# Patient Record
Sex: Female | Born: 1955 | Race: White | Hispanic: No | Marital: Married | Smoking: Current every day smoker
Health system: Southern US, Community
[De-identification: ages and names within clinical notes are randomized; demographics above are authoritative.]

## PROBLEM LIST (undated history)

## (undated) DIAGNOSIS — K5190 Ulcerative colitis, unspecified, without complications: Secondary | ICD-10-CM

## (undated) DIAGNOSIS — F329 Major depressive disorder, single episode, unspecified: Secondary | ICD-10-CM

## (undated) DIAGNOSIS — A498 Other bacterial infections of unspecified site: Secondary | ICD-10-CM

## (undated) DIAGNOSIS — F419 Anxiety disorder, unspecified: Secondary | ICD-10-CM

## (undated) DIAGNOSIS — E7800 Pure hypercholesterolemia, unspecified: Secondary | ICD-10-CM

## (undated) DIAGNOSIS — R002 Palpitations: Secondary | ICD-10-CM

## (undated) DIAGNOSIS — K529 Noninfective gastroenteritis and colitis, unspecified: Principal | ICD-10-CM

## (undated) DIAGNOSIS — F32A Depression, unspecified: Secondary | ICD-10-CM

## (undated) DIAGNOSIS — K859 Acute pancreatitis without necrosis or infection, unspecified: Secondary | ICD-10-CM

## (undated) HISTORY — PX: APPENDECTOMY: SHX54

## (undated) HISTORY — DX: Palpitations: R00.2

## (undated) HISTORY — DX: Major depressive disorder, single episode, unspecified: F32.9

## (undated) HISTORY — DX: Pure hypercholesterolemia, unspecified: E78.00

## (undated) HISTORY — DX: Noninfective gastroenteritis and colitis, unspecified: K52.9

## (undated) HISTORY — DX: Depression, unspecified: F32.A

## (undated) HISTORY — DX: Ulcerative colitis, unspecified, without complications: K51.90

## (undated) HISTORY — PX: TONSILLECTOMY: SUR1361

## (undated) HISTORY — DX: Other bacterial infections of unspecified site: A49.8

## (undated) HISTORY — DX: Anxiety disorder, unspecified: F41.9

---

## 2005-11-13 ENCOUNTER — Ambulatory Visit: Payer: Self-pay | Admitting: Internal Medicine

## 2006-01-13 ENCOUNTER — Ambulatory Visit: Payer: Self-pay | Admitting: Internal Medicine

## 2006-02-05 ENCOUNTER — Ambulatory Visit: Payer: Self-pay | Admitting: Internal Medicine

## 2006-05-21 ENCOUNTER — Emergency Department (HOSPITAL_COMMUNITY): Admission: EM | Admit: 2006-05-21 | Discharge: 2006-05-21 | Payer: Self-pay | Admitting: Emergency Medicine

## 2007-07-03 ENCOUNTER — Ambulatory Visit: Payer: Self-pay | Admitting: Internal Medicine

## 2007-07-03 ENCOUNTER — Encounter (INDEPENDENT_AMBULATORY_CARE_PROVIDER_SITE_OTHER): Payer: Self-pay | Admitting: *Deleted

## 2007-07-03 DIAGNOSIS — J189 Pneumonia, unspecified organism: Secondary | ICD-10-CM | POA: Insufficient documentation

## 2007-08-30 ENCOUNTER — Ambulatory Visit: Payer: Self-pay | Admitting: Internal Medicine

## 2007-08-30 DIAGNOSIS — R1084 Generalized abdominal pain: Secondary | ICD-10-CM | POA: Insufficient documentation

## 2007-08-30 DIAGNOSIS — R109 Unspecified abdominal pain: Secondary | ICD-10-CM | POA: Insufficient documentation

## 2007-08-30 DIAGNOSIS — Z8719 Personal history of other diseases of the digestive system: Secondary | ICD-10-CM | POA: Insufficient documentation

## 2007-09-03 ENCOUNTER — Ambulatory Visit: Payer: Self-pay | Admitting: Gastroenterology

## 2007-09-03 LAB — CONVERTED CEMR LAB
ALT: 13 units/L (ref 0–35)
AST: 16 units/L (ref 0–37)
Alkaline Phosphatase: 63 units/L (ref 39–117)
BUN: 12 mg/dL (ref 6–23)
Basophils Relative: 1.2 % — ABNORMAL HIGH (ref 0.0–1.0)
Bilirubin, Direct: 0.1 mg/dL (ref 0.0–0.3)
CO2: 28 meq/L (ref 19–32)
Calcium: 9.2 mg/dL (ref 8.4–10.5)
Chloride: 105 meq/L (ref 96–112)
Eosinophils Absolute: 0 10*3/uL (ref 0.0–0.6)
Eosinophils Relative: 0.7 % (ref 0.0–5.0)
GFR calc Af Amer: 113 mL/min
GFR calc non Af Amer: 94 mL/min
Glucose, Bld: 91 mg/dL (ref 70–99)
Monocytes Relative: 8.6 % (ref 3.0–11.0)
Neutro Abs: 3.6 10*3/uL (ref 1.4–7.7)
Platelets: 202 10*3/uL (ref 150–400)
RBC: 4.16 M/uL (ref 3.87–5.11)
WBC: 5 10*3/uL (ref 4.5–10.5)

## 2007-09-06 ENCOUNTER — Ambulatory Visit (HOSPITAL_COMMUNITY): Admission: RE | Admit: 2007-09-06 | Discharge: 2007-09-06 | Payer: Self-pay | Admitting: Gastroenterology

## 2007-09-06 ENCOUNTER — Encounter: Payer: Self-pay | Admitting: Gastroenterology

## 2007-09-06 ENCOUNTER — Encounter: Payer: Self-pay | Admitting: Internal Medicine

## 2007-09-10 ENCOUNTER — Ambulatory Visit: Payer: Self-pay | Admitting: Gastroenterology

## 2007-10-01 ENCOUNTER — Ambulatory Visit: Payer: Self-pay | Admitting: Gastroenterology

## 2007-10-05 ENCOUNTER — Ambulatory Visit (HOSPITAL_COMMUNITY): Admission: RE | Admit: 2007-10-05 | Discharge: 2007-10-05 | Payer: Self-pay | Admitting: Gastroenterology

## 2008-04-11 ENCOUNTER — Ambulatory Visit: Payer: Self-pay | Admitting: Internal Medicine

## 2008-04-11 DIAGNOSIS — F411 Generalized anxiety disorder: Secondary | ICD-10-CM | POA: Insufficient documentation

## 2008-05-08 ENCOUNTER — Telehealth: Payer: Self-pay | Admitting: Internal Medicine

## 2008-05-24 ENCOUNTER — Telehealth: Payer: Self-pay | Admitting: Internal Medicine

## 2008-06-16 ENCOUNTER — Telehealth: Payer: Self-pay | Admitting: Internal Medicine

## 2008-10-18 ENCOUNTER — Telehealth: Payer: Self-pay | Admitting: Internal Medicine

## 2009-02-05 ENCOUNTER — Telehealth: Payer: Self-pay | Admitting: Internal Medicine

## 2009-05-09 ENCOUNTER — Telehealth: Payer: Self-pay | Admitting: Internal Medicine

## 2009-10-11 ENCOUNTER — Telehealth: Payer: Self-pay | Admitting: Internal Medicine

## 2009-10-26 ENCOUNTER — Telehealth: Payer: Self-pay

## 2009-10-29 ENCOUNTER — Ambulatory Visit: Payer: Self-pay | Admitting: Internal Medicine

## 2009-10-29 DIAGNOSIS — F32A Depression, unspecified: Secondary | ICD-10-CM | POA: Insufficient documentation

## 2009-10-29 DIAGNOSIS — F329 Major depressive disorder, single episode, unspecified: Secondary | ICD-10-CM | POA: Insufficient documentation

## 2009-10-29 DIAGNOSIS — Z8679 Personal history of other diseases of the circulatory system: Secondary | ICD-10-CM | POA: Insufficient documentation

## 2010-05-06 ENCOUNTER — Telehealth: Payer: Self-pay | Admitting: Internal Medicine

## 2010-08-27 ENCOUNTER — Telehealth: Payer: Self-pay | Admitting: Internal Medicine

## 2010-09-26 NOTE — Progress Notes (Signed)
  Phone Note Call from Patient   Caller: Patient Call For: Marletta Lor  MD Summary of Call: [phone] Wants to wean off of Paroxetine?  How should she do it? Initial call taken by: [facility] CMA AAMA,  August 27, 2010 1:41 PM    1/2 daily for 3 weeks, then d/c Per Dr. Raliegh Ip and pt notified.

## 2010-09-26 NOTE — Progress Notes (Signed)
Summary: refill today  Phone Note Call from Patient Call back at Home Phone [phone]   Caller: Patient---live call Reason for Call: Refill Medication Summary of Call: pt is out of meds for 2 days. she is aware that she needs an appt. refuses appt today. will call back for appt. call pt when done. Initial call taken by: Despina Arias,  October 11, 2009 12:37 PM    Prescriptions: PAROXETINE HCL 20 MG  TABS (PAROXETINE HCL) one daily  #30 x 1   Entered by:   Cay Schillings LPN   Authorized by:   Marletta Lor  MD   Signed by:   Cay Schillings LPN on 35/24/8185   Method used:   [facility]        [facility]  [phone][facility] (retail[facility]       [place], [place]  [address]       Ph: [phone] or [phone]       Fax: [phone]   RxID:   [id number]  30 day supply- needs ROV prior to further refills   spoke with pt - verbalized understanding for f/u appt , only 30 days of med.  KIK

## 2010-09-26 NOTE — Progress Notes (Signed)
  Phone Note Call from Patient   Caller: Patient Reason for Call: Refill Medication Summary of Call: needs appt for monday - tried to fill lorazepam and couldn't - we never rec'd request - pt aware - appt made.   Initial call taken by: Cay Schillings LPN,  October 26, 7197 3:52 PM

## 2010-09-26 NOTE — Assessment & Plan Note (Signed)
Summary: FUP PER KIM//CCM   Vital Signs:  Patient profile:   55 year old female Weight:      134 pounds Temp:     98.4 degrees F oral BP sitting:   100 / 60  (right arm) Cuff size:   regular  Vitals Entered By: Cay Schillings LPN (October 29, 7260 03:55 AM) CC: medication review - needs refills Is Patient Diabetic? No   CC:  medication review - needs refills.  History of Present Illness: 55 year old patient who is seen today for follow-up of anxiety, depression.  She has done quite well, and much less depression.  She continues to benefit from lorazepam for anxiety.   she does describe some palpitations that occur when she first wakes in the morning on a fairly consistent basis.  She describes fast forceful irregular palpitations.  These resolve when she gets up and starts moving around a bit.  No associated symptoms.  Preventive Screening-Counseling & Management  Alcohol-Tobacco     Smoking Status: current  Allergies (verified): No Known Drug Allergies  Past History:  Past Medical History: migraine headaches Anxiety palpitations Depression  Family History: Reviewed history from 08/30/2007 and no changes required. father died age 12, prostate cancer  mother in good health, as are 4 brothers and one sister  Social History: Reviewed history from 08/30/2007 and no changes required. Current Smoker Divorced 3 children  Review of Systems       The patient complains of weight gain and depression.  The patient denies anorexia, fever, weight loss, vision loss, decreased hearing, hoarseness, chest pain, syncope, dyspnea on exertion, peripheral edema, prolonged cough, headaches, hemoptysis, abdominal pain, melena, hematochezia, severe indigestion/heartburn, hematuria, incontinence, genital sores, muscle weakness, suspicious skin lesions, transient blindness, difficulty walking, unusual weight change, abnormal bleeding, enlarged lymph nodes, angioedema, and breast masses.     Physical Exam  General:  Well-developed,well-nourished,in no acute distress; alert,appropriate and cooperative throughout examination Head:  Normocephalic and atraumatic without obvious abnormalities. No apparent alopecia or balding. Eyes:  No corneal or conjunctival inflammation noted. EOMI. Perrla. Funduscopic exam benign, without hemorrhages, exudates or papilledema. Vision grossly normal. Ears:  External ear exam shows no significant lesions or deformities.  Otoscopic examination reveals clear canals, tympanic membranes are intact bilaterally without bulging, retraction, inflammation or discharge. Hearing is grossly normal bilaterally. Mouth:  Oral mucosa and oropharynx without lesions or exudates.  Teeth in good repair. Neck:  No deformities, masses, or tenderness noted. Lungs:  Normal respiratory effort, chest expands symmetrically. Lungs are clear to auscultation, no crackles or wheezes. Heart:  Normal rate and regular rhythm. S1 and S2 normal without gallop, murmur, click, rub or other extra sounds. Abdomen:  Bowel sounds positive,abdomen soft and non-tender without masses, organomegaly or hernias noted.   Impression & Recommendations:  Problem # 1:  PALPITATIONS, HX OF (ICD-V12.50) have asked the patient to monitor and to check her pulse rate.  She will call if pulse rate is in excess of 120  Problem # 2:  ANXIETY (ICD-300.00)  Her updated medication list for this problem includes:    Paroxetine Hcl 20 Mg Tabs (Paroxetine hcl) ..... One daily    Lorazepam 1 Mg Tabs (Lorazepam) ..... One tablet twice daily as needed  Her updated medication list for this problem includes:    Paroxetine Hcl 20 Mg Tabs (Paroxetine hcl) ..... One daily    Lorazepam 1 Mg Tabs (Lorazepam) ..... One tablet twice daily as needed  Problem # 3:  DEPRESSION (ICD-311)  Her  updated medication list for this problem includes:    Paroxetine Hcl 20 Mg Tabs (Paroxetine hcl) ..... One daily    Lorazepam 1 Mg  Tabs (Lorazepam) ..... One tablet twice daily as needed  Her updated medication list for this problem includes:    Paroxetine Hcl 20 Mg Tabs (Paroxetine hcl) ..... One daily    Lorazepam 1 Mg Tabs (Lorazepam) ..... One tablet twice daily as needed  Complete Medication List: 1)  Paroxetine Hcl 20 Mg Tabs (Paroxetine hcl) .... One daily 2)  Lorazepam 1 Mg Tabs (Lorazepam) .... One tablet twice daily as needed 3)  Chantix Starting Month Pak 0.5 Mg X 11 & 1 Mg X 42 Misc (Varenicline tartrate) .... As directed 4)  Chantix Continuing Month Pak 1 Mg Tabs (Varenicline tartrate) .... As directed 5)  Valtrex 500 Mg Tabs (Valacyclovir hcl) .... One twice daily for 3 days  Patient Instructions: 1)  Limit your Sodium (Salt). 2)  It is important that you exercise regularly at least 20 minutes 5 times a week. If you develop chest pain, have severe difficulty breathing, or feel very tired , stop exercising immediately and seek medical attention. 3)  Take calcium +Vitamin D daily. 4)  annual exam in 6 to 12 months Prescriptions: VALTREX 500 MG TABS (VALACYCLOVIR HCL) one twice daily for 3 days  #20 x 4   Entered and Authorized by:   Marletta Lor  MD   Signed by:   Marletta Lor  MD on 10/29/2009   Method used:   Print then Give to Patient   RxID:   [id number] LORAZEPAM 1 MG  TABS (LORAZEPAM) one tablet twice daily as needed  #90 x 4   Entered and Authorized by:   Marletta Lor  MD   Signed by:   Marletta Lor  MD on 10/29/2009   Method used:   Print then Give to Patient   RxID:   [id number] PAROXETINE HCL 20 MG  TABS (PAROXETINE HCL) one daily  #90 x 4   Entered and Authorized by:   Marletta Lor  MD   Signed by:   Marletta Lor  MD on 10/29/2009   Method used:   Print then Give to Patient   RxID:   [id number]

## 2010-09-26 NOTE — Progress Notes (Signed)
Summary: refill lorazopam  Phone Note Refill Request Message from:  Fax from Pharmacy on May 06, 2010 1:59 PM  Refills Requested: Medication #1:  LORAZEPAM 1 MG  TABS one tablet twice daily as needed   [facility]   Method Requested: Fax to [facility] Initial call taken by: Cay Schillings LPN,  May 06, 720 2:00 PM    Prescriptions: LORAZEPAM 1 MG  TABS (LORAZEPAM) one tablet twice daily as needed  #90 x 2   Entered by:   Cay Schillings LPN   Authorized by:   Marletta Lor  MD   Signed by:   Cay Schillings LPN on 71/16/5461   Method used:   Historical   RxID:   [id number]  faxed to [facility]

## 2010-10-31 ENCOUNTER — Other Ambulatory Visit: Payer: Self-pay | Admitting: Internal Medicine

## 2010-12-06 ENCOUNTER — Other Ambulatory Visit: Payer: Self-pay

## 2010-12-06 MED ORDER — LORAZEPAM 1 MG PO TABS: 1.0000 mg | ORAL_TABLET | Freq: Two times a day (BID) | ORAL | Status: DC

## 2011-01-07 NOTE — Assessment & Plan Note (Signed)
[name] OFFICE NOTE   [name], [name]                        MRN:          [id number]  DATE:09/03/2007                            DOB:          [birth year]    PRIMARY CARE PHYSICIAN:  Dr. Burnice Logan.   REASON FOR REFERRAL:  Dr. Burnice Logan asked me to evaluate [name] in  consultation regarding diarrhea, bleeding, cramping in abdomen.   HISTORY OF PRESENT ILLNESS:  [name] is a very pleasant 56 year old  woman who for approximately the past year has had intermittent loose  stools, sometimes associated with bleeding.  Over the same period of  time she has lost about 10 pounds.  She has intermittent cramps in her  abdomen that seem to be brief.  She has had another episode of these  loose stools for the past week or so, this time it seems more frankly  bloody diarrhea.  She has been fairly tired, she gets full very early  after she eats.  She has never had a colonoscopy.   REVIEW OF SYSTEMS:  Notable for a 10 pound weight loss, is otherwise  negative except as mentioned above.   PAST MEDICAL HISTORY:  None.   CURRENT MEDICINES:  None.   DRUG ALLERGIES:  NONE.   SOCIAL HISTORY:  Separated with 3 children, lives with her son.  Works  in [employer].  Smokes 4-5 cigarettes a day (has not been cutting  back).  Drinks 2-3 glasses of wine a week.  Drinks 1 glass of  caffeinated tea a day but not much more caffeine.   FAMILY HISTORY:  No colon cancer, colon polyps or colitis in family.   PHYSICAL EXAMINATION:  Five foot five inches, 123 pounds, blood pressure  130/70, pulse 68.  CONSTITUTIONAL:  Generally well-appearing.  NEUROLOGIC:  Alert and oriented x3.  EYES:  Extraocular movements intact.  MOUTH:  Oropharynx moist, no lesions.  NECK:  Supple no lymphadenopathy.  CARDIOVASCULAR:  Heart regular rate and rhythm.  LUNGS:  Clear to auscultation bilaterally.  ABDOMEN:  Soft, nontender,  nondistended, normal bowel sounds.  EXTREMITIES:  No lower extremity edema.  SKIN:  No rashes or lesions on visible extremities.   ASSESSMENT/PLAN:  A 55 year old woman with intermittent loose and  sometimes bloody stools, now with one week of loose, bloody stools.   I am concerned that she may have new diagnosis of inflammatory bowel  disease.  Less likely is neoplastic process or infection.  I will  arrange for her to have blood tests done today for complete metabolic  profile, thyroid testing, CBC, sed rate.  We will also arrange for her  to have a colonoscopy done in the next 2-3 days.  I would not want to  start her on any specific treatments until after evaluating her colon.     Milus Banister, MD  Electronically Signed    DPJ/MedQ  DD: 09/03/2007  DT: 09/03/2007  Job #: [id number]   cc:   Marletta Lor, MD

## 2011-01-07 NOTE — Assessment & Plan Note (Signed)
[facility] OFFICE NOTE   [name]                        MRN:          [id number]  DATE:10/01/2007                            DOB:          10-04-1955    PRIMARY CARE PHYSICIAN:  Paige Lor, MD.   GI PROBLEM LIST:  1. Patchy colitis.  Rectum and right colon most affected.  January      2009 colonoscopy, biopsies showed chronic, active, mucosal      inflammation.  Most consistent with inflammatory bowel disease.      The patient had presented with diarrhea, bleeding, and cramping.      Weight loss.  2. Tubular adenoma in rectum 15 mm, recall colonoscopy at three-year      interval.   INTERVAL HISTORY:  I last saw [name] at the time of her incomplete  colonoscopy about three weeks ago.  She had some patchy colitis on an  incomplete colonoscopy.  I was only able to get to her distal ascending  colon.  I started her on Lialda two pills a day and she took them for  about a week and since then has stopped taking them.  She says she feels  much better and thinks that things have just gone away.  She has had no  bleeding, no cramping, no diarrhea.   CURRENT MEDICINES:  None.   PHYSICAL EXAMINATION:  VITAL SIGNS:  Weight 127 pounds, she is up 4  pounds since her last visit, blood pressure 142/78, pulse 68.  CONSTITUTIONAL:  Generally well-appearing.  ABDOMEN:  Soft, nontender, and nondistended, normal bowel sounds.   ASSESSMENT AND PLAN:  A 55 year old woman with patchy colitis seen on  incomplete colonoscopy last month.   First, the colonoscopy was indeed incomplete due to tortuosity and  length of colon and so I arranged for her to have a barium enema to  evaluate the remaining 15 to 20% of her right colon.   Second, it does appear that she has inflammatory bowel disease.  She is  certain that she is back to normal and only took Lialda for one week and  has been off it now for three  weeks.  I explained to her that I think  that it is unusual for symptoms to go away so quickly and not require  maintenance medicines, but that I think it is reasonable since she is  indeed feeling back to normal that we simply observe her clinically for  now.  She will therefore return to see me in two months time.  She knows  she should get in touch much sooner than that if she has any troubles.  My plan at that point would be to restart her on her Lialda and probably  work her up to  a full strength of that.  We will have to consider evaluating her small  intestine at some point as well given the patchy nature of this colitis;  Crohn's is more likely than ulcerative colitis.     Paige Banister, MD  Electronically Signed    DPJ/MedQ  DD: 10/01/2007  DT: 10/01/2007  Job #: [id number]   cc:   Paige Lor, MD

## 2011-01-27 ENCOUNTER — Telehealth: Payer: Self-pay | Admitting: Gastroenterology

## 2011-01-27 NOTE — Telephone Encounter (Signed)
Pt given appt for 01/28/11

## 2011-01-27 NOTE — Telephone Encounter (Signed)
Rov before any procedures.

## 2011-01-27 NOTE — Telephone Encounter (Signed)
Pt has abd pain and nausea.  Has BRB on toilet tissue and in the stool.  Also some mucous.  Pt is due for a recall colon.  Do you want to schedule her for a colon or see her in the office first?  Please advise.  Her records are in [place].

## 2011-01-28 ENCOUNTER — Ambulatory Visit (INDEPENDENT_AMBULATORY_CARE_PROVIDER_SITE_OTHER): Payer: Self-pay | Admitting: Gastroenterology

## 2011-01-28 ENCOUNTER — Other Ambulatory Visit (INDEPENDENT_AMBULATORY_CARE_PROVIDER_SITE_OTHER): Payer: Self-pay

## 2011-01-28 VITALS — BP 126/74 | HR 68 | Ht 65.0 in | Wt 135.0 lb

## 2011-01-28 DIAGNOSIS — K5289 Other specified noninfective gastroenteritis and colitis: Secondary | ICD-10-CM

## 2011-01-28 DIAGNOSIS — K529 Noninfective gastroenteritis and colitis, unspecified: Secondary | ICD-10-CM

## 2011-01-28 LAB — CBC WITH DIFFERENTIAL/PLATELET
Basophils Relative: 0.6 % (ref 0.0–3.0)
Eosinophils Relative: 1.2 % (ref 0.0–5.0)
HCT: 36.9 % (ref 36.0–46.0)
Hemoglobin: 12.9 g/dL (ref 12.0–15.0)
Lymphs Abs: 1.6 10*3/uL (ref 0.7–4.0)
MCV: 91 fl (ref 78.0–100.0)
Monocytes Absolute: 0.6 10*3/uL (ref 0.1–1.0)
Monocytes Relative: 9.9 % (ref 3.0–12.0)
Neutro Abs: 3.4 10*3/uL (ref 1.4–7.7)
WBC: 5.7 10*3/uL (ref 4.5–10.5)

## 2011-01-28 LAB — COMPREHENSIVE METABOLIC PANEL
Alkaline Phosphatase: 54 U/L (ref 39–117)
BUN: 17 mg/dL (ref 6–23)
CO2: 28 mEq/L (ref 19–32)
Creatinine, Ser: 0.7 mg/dL (ref 0.4–1.2)
GFR: 89.57 mL/min (ref >60.00)
Glucose, Bld: 91 mg/dL (ref 70–99)
Total Bilirubin: 0.3 mg/dL (ref 0.3–1.2)
Total Protein: 6.8 g/dL (ref 6.0–8.3)

## 2011-01-28 LAB — SEDIMENTATION RATE: Sed Rate: 21 mm/hr (ref 0–22)

## 2011-01-28 MED ORDER — MESALAMINE 1.2 G PO TBEC: 1200.0000 mg | DELAYED_RELEASE_TABLET | Freq: Four times a day (QID) | ORAL | Status: DC

## 2011-01-28 MED ORDER — PEG-KCL-NACL-NASULF-NA ASC-C 100 G PO SOLR: 1.0000 | ORAL | Status: DC

## 2011-01-28 NOTE — Patient Instructions (Signed)
Will call in prescription for lialda (take 4 pills once daily). You will be set up for a colonoscopy (at [facility] with propofol sedation). You will have labs checked today in the basement lab.  Please head down after you check out with the front desk  (cbc, cmet, ESR). A copy of this information will be made available to your PCP.

## 2011-01-28 NOTE — Progress Notes (Signed)
Review of pertinent gastrointestinal problems: 1. patchy acute and chronic colitis, colonoscopy January 2009. The examination was incomplete. Followup barium enema was normal. Very brief treatment with mesalamine resolved her symptoms and she never restarted the medicine or followed up. 2. tubular adenoma, 15 mm, colonoscopy 2009. Recall at 3 year interval   HPI: This is a  very pleasant 55 year old woman whom I last saw about 3-1/2 years ago.  She has had episodes of nausea, indigestions.  She has had intermittent mild bloody diarrhea for a few days in a row (every few months).  Weaned off paxil a month ago. For past 2-3 weeks has had looser than usual and bloody stools, almost with every BM.  Mild incontinence. +urgency.  + cramping abdomen.  Has gained some weight over past few years.  She smokes most of a pack cigs per day.   Review of systems: Pertinent positive and negative review of systems were noted in the above HPI section.  All other review of systems was otherwise negative.   Past Medical History, Past Surgical History, Family History, Social History, Current Medications, Allergies were all reviewed with the patient via [facility] record system.   Physical Exam: BP 126/74  Pulse 68  Ht 5' 5"  (1.651 m)  Wt 135 lb (61.236 kg)  BMI 22.47 kg/m2 Constitutional: generally well-appearing Psychiatric: alert and oriented x3 Eyes: extraocular movements intact Mouth: oral pharynx moist, no lesions Neck: supple no lymphadenopathy Cardiovascular: heart regular rate and rhythm Lungs: clear to auscultation bilaterally Abdomen: soft, nontender, nondistended, no obvious ascites, no peritoneal signs, normal bowel sounds Extremities: no lower extremity edema bilaterally Skin: no lesions on visible extremities    Assessment and plan: 55 y.o. female with likely colitis  She is due for polyp surveillance colonoscopy and we will schedule her for repeat  colonoscopy. This should be done at the hospital with propofol given the incomplete examination in 2009. I will start her on mesalamine, full dose to treat what is likely a colitis flare. She will also get a repeat set of labs, see those listed below.

## 2011-02-03 ENCOUNTER — Telehealth: Payer: Self-pay

## 2011-02-03 NOTE — Telephone Encounter (Signed)
Pt appt rescheduled for 03/06/11 pt aware as well as endo

## 2011-02-03 NOTE — Telephone Encounter (Signed)
Left message on machine to call back  

## 2011-02-10 ENCOUNTER — Telehealth: Payer: Self-pay | Admitting: *Deleted

## 2011-02-10 MED ORDER — LORAZEPAM 1 MG PO TABS: 1.0000 mg | ORAL_TABLET | Freq: Every day | ORAL | Status: DC

## 2011-02-10 NOTE — Telephone Encounter (Signed)
Pt states rx refill for anxiety med was denied, ask to make appt.  Has appt scheduled 02/17/11 at 2pm needs enough medication called in until next Monday.

## 2011-02-10 NOTE — Telephone Encounter (Signed)
Spoke with pt - will give #0 only - must keep appt next week for any future refills , dr. Must see at least yearly

## 2011-02-17 ENCOUNTER — Encounter: Payer: Self-pay | Admitting: Internal Medicine

## 2011-02-17 ENCOUNTER — Ambulatory Visit (INDEPENDENT_AMBULATORY_CARE_PROVIDER_SITE_OTHER): Payer: Self-pay | Admitting: Internal Medicine

## 2011-02-17 DIAGNOSIS — F3289 Other specified depressive episodes: Secondary | ICD-10-CM

## 2011-02-17 DIAGNOSIS — F329 Major depressive disorder, single episode, unspecified: Secondary | ICD-10-CM

## 2011-02-17 DIAGNOSIS — F411 Generalized anxiety disorder: Secondary | ICD-10-CM

## 2011-02-17 DIAGNOSIS — K529 Noninfective gastroenteritis and colitis, unspecified: Secondary | ICD-10-CM

## 2011-02-17 DIAGNOSIS — K5289 Other specified noninfective gastroenteritis and colitis: Secondary | ICD-10-CM

## 2011-02-17 MED ORDER — LORAZEPAM 1 MG PO TABS: 1.0000 mg | ORAL_TABLET | Freq: Every day | ORAL | Status: DC

## 2011-02-17 NOTE — Patient Instructions (Signed)
Attempt at a dose reduction of 0.5 mg of lorazepam at bedtime. If this is well tolerated use as needed only  GI followup as scheduled    It is important that you exercise regularly, at least 20 minutes 3 to 4 times per week.  If you develop chest pain or shortness of breath seek  medical attention.  Call or return to clinic prn if these symptoms worsen or fail to improve as anticipated.

## 2011-02-17 NOTE — Progress Notes (Signed)
  Subjective:    Patient ID: [name], female    DOB: November 15, 1955, 55 y.o.   MRN: [id number]  HPI  55 year old patient who has a history of anxiety depression. Approximately 6 weeks ago she tapered and discontinued Paxil and has done fairly well she still has some anxiety and insomnia issues but is pleased to be off the Paxil. She continues to take her lorazepam 1 mg at bedtime. She feels that she needs an anxiolytic at bedtime to assist with sleep. She is followed closely by GI for colitis and is scheduled for a followup colonoscopy soon;  she has been resumed on mesalamine   Review of Systems  Constitutional: Negative.   HENT: Negative for hearing loss, congestion, sore throat, rhinorrhea, dental problem, sinus pressure and tinnitus.   Eyes: Negative for pain, discharge and visual disturbance.  Respiratory: Negative for cough and shortness of breath.   Cardiovascular: Negative for chest pain, palpitations and leg swelling.  Gastrointestinal: Negative for nausea, vomiting, abdominal pain, diarrhea, constipation, blood in stool and abdominal distention.  Genitourinary: Negative for dysuria, urgency, frequency, hematuria, flank pain, vaginal bleeding, vaginal discharge, difficulty urinating, vaginal pain and pelvic pain.  Musculoskeletal: Negative for joint swelling, arthralgias and gait problem.  Skin: Negative for rash.  Neurological: Negative for dizziness, syncope, speech difficulty, weakness, numbness and headaches.  Hematological: Negative for adenopathy.  Psychiatric/Behavioral: Positive for sleep disturbance. Negative for behavioral problems, dysphoric mood and agitation. The patient is nervous/anxious.        Objective:   Physical Exam  Constitutional: She is oriented to person, place, and time. She appears well-developed and well-nourished.  HENT:  Head: Normocephalic.  Right Ear: External ear normal.  Left Ear: External ear normal.  Mouth/Throat: Oropharynx is clear and  moist.  Eyes: Conjunctivae and EOM are normal. Pupils are equal, round, and reactive to light.  Neck: Normal range of motion. Neck supple. No thyromegaly present.  Cardiovascular: Normal rate, regular rhythm, normal heart sounds and intact distal pulses.   Pulmonary/Chest: Effort normal and breath sounds normal.  Abdominal: Soft. Bowel sounds are normal. She exhibits no mass. There is no tenderness.  Musculoskeletal: Normal range of motion.  Lymphadenopathy:    She has no cervical adenopathy.  Neurological: She is alert and oriented to person, place, and time.  Skin: Skin is warm and dry. No rash noted.  Psychiatric: She has a normal mood and affect. Her behavior is normal.          Assessment & Plan:   Anxiety depression. Stable off Paxil Insomnia. We'll try a dose reduction of 0.5 of lorazepam at bedtime. If this is well tolerated we'll try a when necessary only regimen  GI followup and colonoscopy as scheduled

## 2011-02-27 ENCOUNTER — Other Ambulatory Visit: Payer: Self-pay | Admitting: Gastroenterology

## 2011-03-06 ENCOUNTER — Other Ambulatory Visit: Payer: Self-pay | Admitting: Gastroenterology

## 2011-03-06 ENCOUNTER — Ambulatory Visit (HOSPITAL_COMMUNITY)
Admission: RE | Admit: 2011-03-06 | Discharge: 2011-03-06 | Disposition: A | Discharge status: Home / Self Care | Payer: Managed Care, Other (non HMO) | Source: Ambulatory Visit | Attending: Gastroenterology | Admitting: Gastroenterology

## 2011-03-06 ENCOUNTER — Other Ambulatory Visit: Payer: Managed Care, Other (non HMO) | Admitting: Gastroenterology

## 2011-03-06 DIAGNOSIS — Z09 Encounter for follow-up examination after completed treatment for conditions other than malignant neoplasm: Secondary | ICD-10-CM | POA: Insufficient documentation

## 2011-03-06 DIAGNOSIS — Z86010 Personal history of colonic polyps: Secondary | ICD-10-CM

## 2011-03-06 DIAGNOSIS — D126 Benign neoplasm of colon, unspecified: Secondary | ICD-10-CM

## 2011-03-06 DIAGNOSIS — R197 Diarrhea, unspecified: Secondary | ICD-10-CM

## 2011-04-17 ENCOUNTER — Other Ambulatory Visit: Payer: Self-pay | Admitting: Internal Medicine

## 2011-04-17 NOTE — Telephone Encounter (Signed)
Declined re-start - needs to be seen- last seen 10/2009

## 2011-04-17 NOTE — Telephone Encounter (Signed)
ok 

## 2011-04-17 NOTE — Telephone Encounter (Signed)
Titrated off paxil and now want to restart- wants med called in

## 2011-04-18 ENCOUNTER — Other Ambulatory Visit: Payer: Self-pay | Admitting: Internal Medicine

## 2011-04-18 ENCOUNTER — Telehealth: Payer: Self-pay | Admitting: Internal Medicine

## 2011-04-18 MED ORDER — PAROXETINE HCL 20 MG PO TABS: 20.0000 mg | ORAL_TABLET | Freq: Every day | ORAL | Status: DC

## 2011-04-18 NOTE — Telephone Encounter (Signed)
Had been denied - but not from bonnye to dr. Burnice Logan it was ok'd - new rx sent to [facility]

## 2011-04-18 NOTE — Telephone Encounter (Signed)
Left message on triage line. States she has been calling since yesterday and that her pharmacy also sent over a request for her Paxil. States she was on it, took herself off of it, and now realizes that she needs it. [facility]

## 2011-04-18 NOTE — Telephone Encounter (Signed)
Pt need to be seen per dr. Burnice Logan before restart med

## 2011-06-17 ENCOUNTER — Other Ambulatory Visit: Payer: Self-pay

## 2011-06-17 MED ORDER — PAROXETINE HCL 20 MG PO TABS: 20.0000 mg | ORAL_TABLET | Freq: Every day | ORAL | Status: DC

## 2011-09-26 ENCOUNTER — Other Ambulatory Visit: Payer: Self-pay | Admitting: Internal Medicine

## 2011-09-26 MED ORDER — LORAZEPAM 1 MG PO TABS: 1.0000 mg | ORAL_TABLET | Freq: Every day | ORAL | Status: DC

## 2011-09-26 NOTE — Telephone Encounter (Signed)
Rx called in to pharmacy. 

## 2011-09-26 NOTE — Telephone Encounter (Signed)
Pt need refill on lorazepam call [facility].

## 2011-09-26 NOTE — Telephone Encounter (Signed)
Ok ; ROV prior to further RF

## 2011-09-26 NOTE — Telephone Encounter (Signed)
Rx last filled 02/17/11 #60 x 3rf.  Pt last seen same day.  Pls advise.

## 2011-10-10 ENCOUNTER — Encounter: Payer: Self-pay | Admitting: Internal Medicine

## 2011-10-10 ENCOUNTER — Ambulatory Visit (INDEPENDENT_AMBULATORY_CARE_PROVIDER_SITE_OTHER): Payer: Managed Care, Other (non HMO) | Admitting: Internal Medicine

## 2011-10-10 VITALS — BP 120/80 | HR 72 | Temp 98.8°F | Wt 142.0 lb

## 2011-10-10 DIAGNOSIS — G4700 Insomnia, unspecified: Secondary | ICD-10-CM

## 2011-10-10 DIAGNOSIS — R2689 Other abnormalities of gait and mobility: Secondary | ICD-10-CM

## 2011-10-10 DIAGNOSIS — L853 Xerosis cutis: Secondary | ICD-10-CM

## 2011-10-10 DIAGNOSIS — R238 Other skin changes: Secondary | ICD-10-CM

## 2011-10-10 DIAGNOSIS — R209 Unspecified disturbances of skin sensation: Secondary | ICD-10-CM

## 2011-10-10 DIAGNOSIS — R202 Paresthesia of skin: Secondary | ICD-10-CM

## 2011-10-10 DIAGNOSIS — F439 Reaction to severe stress, unspecified: Secondary | ICD-10-CM

## 2011-10-10 DIAGNOSIS — Z733 Stress, not elsewhere classified: Secondary | ICD-10-CM

## 2011-10-10 DIAGNOSIS — R269 Unspecified abnormalities of gait and mobility: Secondary | ICD-10-CM

## 2011-10-10 DIAGNOSIS — L299 Pruritus, unspecified: Secondary | ICD-10-CM | POA: Insufficient documentation

## 2011-10-10 DIAGNOSIS — N390 Urinary tract infection, site not specified: Secondary | ICD-10-CM

## 2011-10-10 LAB — HEPATIC FUNCTION PANEL
AST: 17 U/L (ref 0–37)
Albumin: 3.9 g/dL (ref 3.5–5.2)
Alkaline Phosphatase: 58 U/L (ref 39–117)
Total Protein: 6.7 g/dL (ref 6.0–8.3)

## 2011-10-10 LAB — BASIC METABOLIC PANEL
CO2: 31 mEq/L (ref 19–32)
Calcium: 8.9 mg/dL (ref 8.4–10.5)
Chloride: 105 mEq/L (ref 96–112)
Potassium: 4.3 mEq/L (ref 3.5–5.1)
Sodium: 142 mEq/L (ref 135–145)

## 2011-10-10 LAB — CBC WITH DIFFERENTIAL/PLATELET
Basophils Relative: 0.7 % (ref 0.0–3.0)
Eosinophils Absolute: 0.1 10*3/uL (ref 0.0–0.7)
HCT: 36.2 % (ref 36.0–46.0)
Hemoglobin: 12.4 g/dL (ref 12.0–15.0)
Lymphocytes Relative: 31.7 % (ref 12.0–46.0)
Lymphs Abs: 1.8 10*3/uL (ref 0.7–4.0)
MCHC: 34.3 g/dL (ref 30.0–36.0)
Monocytes Relative: 10.7 % (ref 3.0–12.0)
Neutro Abs: 3.2 10*3/uL (ref 1.4–7.7)
RBC: 4.02 Mil/uL (ref 3.87–5.11)

## 2011-10-10 LAB — POCT URINALYSIS DIPSTICK
Bilirubin, UA: NEGATIVE
Glucose, UA: NEGATIVE
Nitrite, UA: POSITIVE

## 2011-10-10 LAB — T4, FREE: Free T4: 0.76 ng/dL (ref 0.60–1.60)

## 2011-10-10 MED ORDER — PREDNISONE 10 MG PO TABS: 10.0000 mg | ORAL_TABLET | Freq: Every day | ORAL | Status: DC

## 2011-10-10 NOTE — Progress Notes (Signed)
  Subjective:    Patient ID: [name], female    DOB: [birth year], 56 y.o.   MRN: [id number]  HPI Patient comes in today for an [facility] appointment. She complains of 3 weeks of itching all over and dry skin.Onset below behind her right knee where she used hydrocortisone but since that time has spread itching all over her body except for her face worse behind the knees in the antecubital area and around the waist. She stopped using the new body lotion that she had and it still itches. She wonders if it could be stress or something else.  No new medications no one at home has a rash no exposures to small children pets with rashes.  She's been taking Paxil for a while which helps her for her insomnia she has been taking lorazepam every night to help her sleep.   due for a checkup soon   Review of Systems ROS:  GEN/ HEENT: No fever, significant weight changes sweats headaches vision problems hearing changes, CV/ PULM; No chest pain shortness of breath cough, syncope,edema  change in exercise tolerance. GI /GU: No adominal pain, vomiting, change in bowel habits. No blood in the stool. No significant GU symptoms. SKIN/HEME: ,no acute skin rashes suspicious lesions or bleeding. No lymphadenopathy, nodules, masses.  NEURO/ PSYCH:  Feels that the left side of her face droops at times but no speech changes no numbness or other dysfunction no 1 else has noticed it feels that her balance is off but no falling IMM/ Allergy: No unusual infections.  Allergy .   REST of 12 system review negative except as per HPI  Past history family history social history reviewed in the electronic medical record. No eczema s a child but has rash in past needed prednisone to clear      Objective:   Physical Exam Well-developed well-nourished in no acute distress but scratching. HEENT: Normocephalic ;atraumatic , Eyes;  PERRL, EOMs  Full, lids and conjunctiva clear,,Ears: no deformities, canals nl, TM landmarks normal,  Nose: no deformity or discharge  Mouth : OP clear without lesion or edema . Neck: Supple without adenopathy or masses or bruits Chest:  Clear to A&P without wheezes rales or rhonchi CV:  S1-S2 no gallops or murmurs peripheral perfusion is normal Abdomen:  Sof,t normal bowel sounds without hepatosplenomegaly, no guarding rebound or masses no CVA tenderness SKIN: Very dry skin on on the extremities some excoriations on the forearms a few bumps on the back but no burrows diffuse redness behind the right knee and the right elbow antecubital.  No rash between the fingers or in the axilla.  Neurologic grossly intact 3 through 12 cranial nerves gait within normal limits no tremor    Assessment & Plan:  Itching pruritus    Dry skin possible allergic rash unclear etiology at this time. Doesn't seem like scabies by context and exam but always possible with the severity of her itching . Check metabolic factors and CBC thyroid urinalysis rule out infection Use Eucerin or Aquaphor Zyrtec for antihistamine although hydroxyzine may be helpful if she needs to sleep we'll use prednisone 12 day taper for possible allergic etiology. She should make a followup appointment with her primary physician in about 3 weeks or so Results to report the patient  Anxiety stress depression on treatment Insomnia taking lorazepam every night at this time risk benefits discussed she should discuss this with her primary Other complaints uncertain etiology.

## 2011-10-10 NOTE — Patient Instructions (Signed)
Unsure why you are having so much itching but we can do labs  And then come to see dr  k for follow up.  Try zyrtec for   Itching if needed.  Use moisturizer  eucerin cream  or aquafor ointment .  These do not break people out.   Prednisone can help allergic itching evenetually,

## 2011-10-13 LAB — URINE CULTURE: Colony Count: >=100000

## 2011-10-14 ENCOUNTER — Other Ambulatory Visit: Payer: Self-pay | Admitting: Internal Medicine

## 2011-10-14 MED ORDER — SULFAMETHOXAZOLE-TRIMETHOPRIM 800-160 MG PO TABS: 1.0000 | ORAL_TABLET | Freq: Two times a day (BID) | ORAL | Status: AC

## 2011-10-14 NOTE — Progress Notes (Signed)
Quick Note:  Pt aware of results. Rx called into [facility] ______

## 2011-10-23 ENCOUNTER — Encounter: Payer: Self-pay | Admitting: Internal Medicine

## 2011-10-23 ENCOUNTER — Ambulatory Visit (INDEPENDENT_AMBULATORY_CARE_PROVIDER_SITE_OTHER): Payer: Managed Care, Other (non HMO) | Admitting: Internal Medicine

## 2011-10-23 DIAGNOSIS — F329 Major depressive disorder, single episode, unspecified: Secondary | ICD-10-CM

## 2011-10-23 DIAGNOSIS — F411 Generalized anxiety disorder: Secondary | ICD-10-CM

## 2011-10-23 DIAGNOSIS — F3289 Other specified depressive episodes: Secondary | ICD-10-CM

## 2011-10-23 MED ORDER — PAROXETINE HCL 20 MG PO TABS: 20.0000 mg | ORAL_TABLET | Freq: Every day | ORAL | Status: DC

## 2011-10-23 MED ORDER — LORAZEPAM 1 MG PO TABS: 1.0000 mg | ORAL_TABLET | Freq: Every day | ORAL | Status: DC

## 2011-10-23 NOTE — Progress Notes (Signed)
  Subjective:    Patient ID: [name], female    DOB: May 12, 1956, 56 y.o.   MRN: [id number]  HPI  56 year old patient who is in today for followup. She was seen a couple weeks ago with a chief complaint of dry skin and had a comprehensive laboratory panel done at that time. She also required treatment for a UTI. She is much better today. She is seen today in followup. She also requires medication refills. She has done well on Paxil as well as when necessary lorazepam. Complaints include some intermittent paresthesias involving the left facial area. She also describes some left ear discomfort that occurred after an episode of skydiving    Review of Systems  Constitutional: Negative.   HENT: Positive for ear pain. Negative for hearing loss, congestion, sore throat, rhinorrhea, dental problem, sinus pressure and tinnitus.   Eyes: Negative for pain, discharge and visual disturbance.  Respiratory: Negative for cough and shortness of breath.   Cardiovascular: Negative for chest pain, palpitations and leg swelling.  Gastrointestinal: Negative for nausea, vomiting, abdominal pain, diarrhea, constipation, blood in stool and abdominal distention.  Genitourinary: Negative for dysuria, urgency, frequency, hematuria, flank pain, vaginal bleeding, vaginal discharge, difficulty urinating, vaginal pain and pelvic pain.  Musculoskeletal: Negative for joint swelling, arthralgias and gait problem.  Skin: Negative for rash.  Neurological: Negative for dizziness, syncope, speech difficulty, weakness, numbness and headaches.  Hematological: Negative for adenopathy.  Psychiatric/Behavioral: Negative for behavioral problems, dysphoric mood and agitation. The patient is not nervous/anxious.        Objective:   Physical Exam  Constitutional: She is oriented to person, place, and time. She appears well-developed and well-nourished.  HENT:  Head: Normocephalic.  Right Ear: External ear normal.  Left Ear:  External ear normal.  Mouth/Throat: Oropharynx is clear and moist.       Both tympanic membranes were normal  Eyes: Conjunctivae and EOM are normal. Pupils are equal, round, and reactive to light.  Neck: Normal range of motion. Neck supple. No thyromegaly present.  Cardiovascular: Normal rate, regular rhythm, normal heart sounds and intact distal pulses.   Pulmonary/Chest: Effort normal and breath sounds normal.  Abdominal: Soft. Bowel sounds are normal. She exhibits no mass. There is no tenderness.  Musculoskeletal: Normal range of motion.  Lymphadenopathy:    She has no cervical adenopathy.  Neurological: She is alert and oriented to person, place, and time. She has normal reflexes. No cranial nerve deficit. She exhibits normal muscle tone. Coordination normal.       Tandem walk was performed well after a couple of attempts. Initially she did tend to fall to the left  Skin: Skin is warm and dry. No rash noted.  Psychiatric: She has a normal mood and affect. Her behavior is normal.          Assessment & Plan:   Anxiety disorder History depression  Mild left ear pain possibly secondary to mild barotrauma. Normal clinical exam  Medications refilled

## 2011-10-23 NOTE — Patient Instructions (Signed)
It is important that you exercise regularly, at least 20 minutes 3 to 4 times per week.  If you develop chest pain or shortness of breath seek  medical attention.  Call or return to clinic prn if these symptoms worsen or fail to improve as anticipated.

## 2012-01-13 ENCOUNTER — Telehealth: Payer: Self-pay | Admitting: Internal Medicine

## 2012-01-13 NOTE — Telephone Encounter (Signed)
Called [facility] - gave ok for early refill

## 2012-01-13 NOTE — Telephone Encounter (Signed)
Patient called stating that she left her rx, lorazepam in a hotel room when she was on vacation and she use this to sleep. Patient states that upon calling the pharmacy for a refill they informed her that it was too early for a refill. Patient would like the doctor to call her in some lorazepam. Please advise.

## 2012-01-13 NOTE — Telephone Encounter (Signed)
ok 

## 2012-01-13 NOTE — Telephone Encounter (Signed)
Please advise ok to fill early - pt has appt here 6/25

## 2012-05-24 ENCOUNTER — Other Ambulatory Visit: Payer: Self-pay | Admitting: Internal Medicine

## 2012-05-24 NOTE — Telephone Encounter (Signed)
Request noted - there is an e-request in chart already. - called in

## 2012-05-24 NOTE — Telephone Encounter (Signed)
Pt called req refill of LORazepam (ATIVAN) 1 MG tablet to [facility]. Pt is completely out of med. Req that this be called in today.

## 2012-07-19 ENCOUNTER — Other Ambulatory Visit: Payer: Self-pay | Admitting: Internal Medicine

## 2012-07-20 ENCOUNTER — Other Ambulatory Visit: Payer: Self-pay | Admitting: Internal Medicine

## 2012-07-28 ENCOUNTER — Telehealth: Payer: Self-pay | Admitting: Internal Medicine

## 2012-07-28 NOTE — Telephone Encounter (Signed)
Pt calling because Ativan refill was denied. Note states she needs OV. Pt made appt for 1.7.14 & wants to know if Dr. Raliegh Ip will refill enough Ativan to get her to that appt date. She states that d/t her sched she cannot come in earlier than that. Please advise.

## 2012-07-29 MED ORDER — LORAZEPAM 1 MG PO TABS: 1.0000 mg | ORAL_TABLET | Freq: Every evening | ORAL | Status: DC | PRN

## 2012-07-29 NOTE — Telephone Encounter (Signed)
Pt notified Rx refill for Ativan was sent to pharmacy.

## 2012-07-29 NOTE — Telephone Encounter (Signed)
Left message on voicemail. Pt Rx refill for Ativan was sent to pharmacy enough till appt.

## 2012-08-31 ENCOUNTER — Ambulatory Visit (INDEPENDENT_AMBULATORY_CARE_PROVIDER_SITE_OTHER): Payer: Managed Care, Other (non HMO) | Admitting: Internal Medicine

## 2012-08-31 ENCOUNTER — Encounter: Payer: Self-pay | Admitting: Internal Medicine

## 2012-08-31 VITALS — BP 110/70 | HR 70 | Temp 98.9°F | Resp 16 | Wt 142.0 lb

## 2012-08-31 DIAGNOSIS — F411 Generalized anxiety disorder: Secondary | ICD-10-CM

## 2012-08-31 DIAGNOSIS — F3289 Other specified depressive episodes: Secondary | ICD-10-CM

## 2012-08-31 DIAGNOSIS — F329 Major depressive disorder, single episode, unspecified: Secondary | ICD-10-CM

## 2012-08-31 MED ORDER — LORAZEPAM 1 MG PO TABS: 1.0000 mg | ORAL_TABLET | Freq: Every evening | ORAL | Status: DC | PRN

## 2012-08-31 MED ORDER — PAROXETINE HCL 20 MG PO TABS: 20.0000 mg | ORAL_TABLET | Freq: Every day | ORAL | Status: DC

## 2012-08-31 NOTE — Progress Notes (Signed)
Subjective:    Patient ID: [name], female    DOB: 07-20-1956, 57 y.o.   MRN: [id number]  HPI  57 year old patient who has a history of anxiety depression. This has been managed with paroxetine and when necessary lorazepam. She does quite well she has given herself trials of discontinuation but symptoms have relapsed. Today she is doing quite well and is seen today basically for followup and medication refills.  Past Medical History  Diagnosis Date  . Colitis   . Anxiety   . Migraine   . Depression   . Palpitations     History   Social History  . Marital Status: Married    Spouse Name: N/A    Number of Children: 3  . Years of Education: N/A   Occupational History  . insurance    Social History Main Topics  . Smoking status: Current Every Day Smoker -- 1.0 packs/day    Types: Cigarettes  . Smokeless tobacco: Never Used  . Alcohol Use: Yes  . Drug Use: No  . Sexually Active: Not on file   Other Topics Concern  . Not on file   Social History Narrative   Divorced3 children    Past Surgical History  Procedure Date  . Tonsillectomy     Family History  Problem Relation Age of Onset  . Prostate cancer Father   . Hypertension Mother     No Known Allergies  Current Outpatient Prescriptions on File Prior to Visit  Medication Sig Dispense Refill  . PARoxetine (PAXIL) 20 MG tablet Take 1 tablet (20 mg total) by mouth daily.  90 tablet  6    BP 110/70  Pulse 70  Temp 98.9 F (37.2 C) (Oral)  Resp 16  Wt 142 lb (64.411 kg)  SpO2 97%      Review of Systems  Constitutional: Negative.   HENT: Negative for hearing loss, congestion, sore throat, rhinorrhea, dental problem, sinus pressure and tinnitus.   Eyes: Negative for pain, discharge and visual disturbance.  Respiratory: Negative for cough and shortness of breath.   Cardiovascular: Negative for chest pain, palpitations and leg swelling.  Gastrointestinal: Negative for nausea, vomiting, abdominal  pain, diarrhea, constipation, blood in stool and abdominal distention.  Genitourinary: Negative for dysuria, urgency, frequency, hematuria, flank pain, vaginal bleeding, vaginal discharge, difficulty urinating, vaginal pain and pelvic pain.  Musculoskeletal: Negative for joint swelling, arthralgias and gait problem.  Skin: Negative for rash.  Neurological: Negative for dizziness, syncope, speech difficulty, weakness, numbness and headaches.  Hematological: Negative for adenopathy.  Psychiatric/Behavioral: Negative for behavioral problems, dysphoric mood and agitation. The patient is nervous/anxious.        Objective:   Physical Exam  Constitutional: She is oriented to person, place, and time. She appears well-developed and well-nourished.  HENT:  Head: Normocephalic.  Right Ear: External ear normal.  Left Ear: External ear normal.  Mouth/Throat: Oropharynx is clear and moist.  Eyes: Conjunctivae normal and EOM are normal. Pupils are equal, round, and reactive to light.  Neck: Normal range of motion. Neck supple. No thyromegaly present.  Cardiovascular: Normal rate, regular rhythm, normal heart sounds and intact distal pulses.   Pulmonary/Chest: Effort normal and breath sounds normal.  Abdominal: Soft. Bowel sounds are normal. She exhibits no mass. There is no tenderness.  Musculoskeletal: Normal range of motion.  Lymphadenopathy:    She has no cervical adenopathy.  Neurological: She is alert and oriented to person, place, and time.  Skin: Skin is warm and dry.  No rash noted.  Psychiatric: She has a normal mood and affect. Her behavior is normal.          Assessment & Plan:   Anxiety depression. Stable on present regimen. Medications refilled. Recheck in one year or as needed for an annual exam

## 2012-08-31 NOTE — Patient Instructions (Signed)
It is important that you exercise regularly, at least 20 minutes 3 to 4 times per week.  If you develop chest pain or shortness of breath seek  medical attention.  Call or return to clinic prn if these symptoms worsen or fail to improve as anticipated.

## 2012-11-22 ENCOUNTER — Ambulatory Visit (INDEPENDENT_AMBULATORY_CARE_PROVIDER_SITE_OTHER): Payer: Managed Care, Other (non HMO) | Admitting: Internal Medicine

## 2012-11-22 ENCOUNTER — Encounter: Payer: Self-pay | Admitting: Internal Medicine

## 2012-11-22 VITALS — BP 130/70 | HR 80 | Temp 100.2°F | Resp 18 | Wt 138.0 lb

## 2012-11-22 DIAGNOSIS — R509 Fever, unspecified: Secondary | ICD-10-CM

## 2012-11-22 DIAGNOSIS — M545 Low back pain, unspecified: Secondary | ICD-10-CM

## 2012-11-22 DIAGNOSIS — J069 Acute upper respiratory infection, unspecified: Secondary | ICD-10-CM

## 2012-11-22 DIAGNOSIS — R82998 Other abnormal findings in urine: Secondary | ICD-10-CM

## 2012-11-22 DIAGNOSIS — R8290 Unspecified abnormal findings in urine: Secondary | ICD-10-CM

## 2012-11-22 LAB — POCT URINALYSIS DIPSTICK
Nitrite, UA: POSITIVE
Urobilinogen, UA: 0.2
pH, UA: 5

## 2012-11-22 MED ORDER — HYDROCODONE-HOMATROPINE 5-1.5 MG/5ML PO SYRP: 5.0000 mL | ORAL_SOLUTION | Freq: Four times a day (QID) | ORAL | Status: DC | PRN

## 2012-11-22 MED ORDER — CIPROFLOXACIN HCL 500 MG PO TABS: 500.0000 mg | ORAL_TABLET | Freq: Two times a day (BID) | ORAL | Status: DC

## 2012-11-22 NOTE — Patient Instructions (Signed)
Acute bronchitis symptoms for less than 10 days are generally not helped by antibiotics.  Take over-the-counter expectorants and cough medications such as  Mucinex DM.  Call if there is no improvement in 5 to 7 days or if he developed worsening cough, fever, or new symptoms, such as shortness of breath or chest pain.

## 2012-11-22 NOTE — Progress Notes (Signed)
Subjective:    Patient ID: [name], female    DOB: 1956-03-30, 57 y.o.   MRN: [id number]  HPI  57 year old patient who presents with a three-day history of sinus congestion cough fever and low back pain. She also has noted some dark foul-smelling urine. No real dysuria. Urinalysis was reviewed and did reveal some pyuria and hematuria.  Past Medical History  Diagnosis Date  . Colitis   . Anxiety   . Migraine   . Depression   . Palpitations     History   Social History  . Marital Status: Married    Spouse Name: N/A    Number of Children: 3  . Years of Education: N/A   Occupational History  . insurance    Social History Main Topics  . Smoking status: Current Every Day Smoker -- 1.00 packs/day    Types: Cigarettes  . Smokeless tobacco: Never Used  . Alcohol Use: Yes  . Drug Use: No  . Sexually Active: Not on file   Other Topics Concern  . Not on file   Social History Narrative   Divorced   3 children          Past Surgical History  Procedure Laterality Date  . Tonsillectomy      Family History  Problem Relation Age of Onset  . Prostate cancer Father   . Hypertension Mother     No Known Allergies  Current Outpatient Prescriptions on File Prior to Visit  Medication Sig Dispense Refill  . LORazepam (ATIVAN) 1 MG tablet Take 1 tablet (1 mg total) by mouth at bedtime as needed for anxiety.  90 tablet  2  . PARoxetine (PAXIL) 20 MG tablet Take 1 tablet (20 mg total) by mouth daily.  90 tablet  6   No current facility-administered medications on file prior to visit.    BP 130/70  Pulse 80  Temp(Src) 100.2 F (37.9 C) (Oral)  Resp 18  Wt 138 lb (62.596 kg)  BMI 22.96 kg/m2  SpO2 97%       Review of Systems  Constitutional: Negative.   HENT: Negative for hearing loss, congestion, sore throat, rhinorrhea, dental problem, sinus pressure and tinnitus.   Eyes: Negative for pain, discharge and visual disturbance.  Respiratory: Negative for cough  and shortness of breath.   Cardiovascular: Negative for chest pain, palpitations and leg swelling.  Gastrointestinal: Negative for nausea, vomiting, abdominal pain, diarrhea, constipation, blood in stool and abdominal distention.  Genitourinary: Negative for dysuria, urgency, frequency, hematuria, flank pain, vaginal bleeding, vaginal discharge, difficulty urinating, vaginal pain and pelvic pain.  Musculoskeletal: Negative for joint swelling, arthralgias and gait problem.  Skin: Negative for rash.  Neurological: Negative for dizziness, syncope, speech difficulty, weakness, numbness and headaches.  Hematological: Negative for adenopathy.  Psychiatric/Behavioral: Negative for behavioral problems, dysphoric mood and agitation. The patient is not nervous/anxious.        Objective:   Physical Exam  Constitutional: She is oriented to person, place, and time. She appears well-developed and well-nourished.  Appears unwell but in no acute distress. Pulse 80 Temperature 100.2  HENT:  Head: Normocephalic.  Right Ear: External ear normal.  Left Ear: External ear normal.  Mouth/Throat: Oropharynx is clear and moist.  Eyes: Conjunctivae and EOM are normal. Pupils are equal, round, and reactive to light.  Neck: Normal range of motion. Neck supple. No thyromegaly present.  Cardiovascular: Normal rate, regular rhythm, normal heart sounds and intact distal pulses.   Pulmonary/Chest: Effort normal and  breath sounds normal.  Abdominal: Soft. Bowel sounds are normal. She exhibits no distension and no mass. There is no tenderness. There is no rebound and no guarding.  Musculoskeletal: Normal range of motion.  Lymphadenopathy:    She has no cervical adenopathy.  Neurological: She is alert and oriented to person, place, and time.  Skin: Skin is warm and dry. No rash noted.  Psychiatric: She has a normal mood and affect. Her behavior is normal.          Assessment & Plan:   Viral URI UTI  Will  treat with Cipro for 3 days and also symptomatically with Hydromet. Tylenol for fever control

## 2012-11-28 ENCOUNTER — Ambulatory Visit (INDEPENDENT_AMBULATORY_CARE_PROVIDER_SITE_OTHER): Payer: Medicare FFS | Admitting: Family Medicine

## 2012-11-28 ENCOUNTER — Ambulatory Visit: Payer: Medicare FFS

## 2012-11-28 VITALS — BP 116/64 | HR 50 | Temp 97.9°F | Resp 16 | Ht 65.0 in | Wt 135.4 lb

## 2012-11-28 DIAGNOSIS — R059 Cough, unspecified: Secondary | ICD-10-CM

## 2012-11-28 DIAGNOSIS — R05 Cough: Secondary | ICD-10-CM

## 2012-11-28 DIAGNOSIS — J209 Acute bronchitis, unspecified: Secondary | ICD-10-CM

## 2012-11-28 LAB — POCT CBC
Granulocyte percent: 50.4 %G (ref 37–80)
HCT, POC: 40.4 % (ref 37.7–47.9)
Hemoglobin: 12.6 g/dL (ref 12.2–16.2)
Lymph, poc: 1.6 (ref 0.6–3.4)
MCH, POC: 28.4 pg (ref 27–31.2)
MCHC: 31.2 g/dL — AB (ref 31.8–35.4)
MCV: 91.2 fL (ref 80–97)
MID (cbc): 0.3 (ref 0–0.9)
MPV: 9.5 fL (ref 0–99.8)
POC Granulocyte: 2 (ref 2–6.9)
POC LYMPH PERCENT: 40.9 %L (ref 10–50)
POC MID %: 8.7 %M (ref 0–12)
Platelet Count, POC: 183 10*3/uL (ref 142–424)
RBC: 4.43 M/uL (ref 4.04–5.48)
RDW, POC: 12.9 %
WBC: 4 10*3/uL — AB (ref 4.6–10.2)

## 2012-11-28 LAB — COMPREHENSIVE METABOLIC PANEL
ALT: 12 U/L (ref 0–35)
AST: 15 U/L (ref 0–37)
Albumin: 4.1 g/dL (ref 3.5–5.2)
Alkaline Phosphatase: 51 U/L (ref 39–117)
BUN: 15 mg/dL (ref 6–23)
CO2: 26 mEq/L (ref 19–32)
Calcium: 8.6 mg/dL (ref 8.4–10.5)
Chloride: 108 mEq/L (ref 96–112)
Creat: 0.77 mg/dL (ref 0.50–1.10)
Glucose, Bld: 86 mg/dL (ref 70–99)
Potassium: 4.5 mEq/L (ref 3.5–5.3)
Sodium: 141 mEq/L (ref 135–145)
Total Bilirubin: 0.5 mg/dL (ref 0.3–1.2)
Total Protein: 6.6 g/dL (ref 6.0–8.3)

## 2012-11-28 LAB — TSH: TSH: 0.97 u[IU]/mL (ref 0.350–4.500)

## 2012-11-28 MED ORDER — HYDROCODONE-HOMATROPINE 5-1.5 MG/5ML PO SYRP: 5.0000 mL | ORAL_SOLUTION | Freq: Four times a day (QID) | ORAL | Status: AC | PRN

## 2012-11-28 MED ORDER — PREDNISONE 20 MG PO TABS: ORAL_TABLET | ORAL | Status: DC

## 2012-11-28 NOTE — Progress Notes (Signed)
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  Her PCP dx'd her with UTI and URI and prescribed cipro.    Cough:  Unproductive until today, after starting mucinex yesterday  Fever present daily.  Objective:   NAD No jaundice or pallor HEENT:  Unremarkable Neck:  Supple no adenopathy Chest:  Decreased breath sounds right side relative to left.  [facility] reading (PRIMARY) by  Dr. Joseph Art:  CXR shows heavy bronchial markings Results for orders placed in visit on 11/28/12  POCT CBC      Result Value Range   WBC 4.0 (*) 4.6 - 10.2 K/uL   Lymph, poc 1.6  0.6 - 3.4   POC LYMPH PERCENT 40.9  10 - 50 %L   MID (cbc) 0.3  0 - 0.9   POC MID % 8.7  0 - 12 %M   POC Granulocyte 2.0  2 - 6.9   Granulocyte percent 50.4  37 - 80 %G   RBC 4.43  4.04 - 5.48 M/uL   Hemoglobin 12.6  12.2 - 16.2 g/dL   HCT, POC 40.4  37.7 - 47.9 %   MCV 91.2  80 - 97 fL   MCH, POC 28.4  27 - 31.2 pg   MCHC 31.2 (*) 31.8 - 35.4 g/dL   RDW, POC 12.9     Platelet Count, POC 183  142 - 424 K/uL   MPV 9.5  0 - 99.8 fL   Assessment:  Viral bronchitis  Plan: .

## 2012-11-28 NOTE — Patient Instructions (Addendum)
Try the e-cigarettes as a strategy to quit smoking   Bronchitis Bronchitis is the body's way of reacting to injury and/or infection (inflammation) of the bronchi. Bronchi are the air tubes that extend from the windpipe into the lungs. If the inflammation becomes severe, it may cause shortness of breath. CAUSES  Inflammation may be caused by:  A virus.  Germs (bacteria).  Dust.  Allergens.  Pollutants and many other irritants. The cells lining the bronchial tree are covered with tiny hairs (cilia). These constantly beat upward, away from the lungs, toward the mouth. This keeps the lungs free of pollutants. When these cells become too irritated and are unable to do their job, mucus begins to develop. This causes the characteristic cough of bronchitis. The cough clears the lungs when the cilia are unable to do their job. Without either of these protective mechanisms, the mucus would settle in the lungs. Then you would develop pneumonia. Smoking is a common cause of bronchitis and can contribute to pneumonia. Stopping this habit is the single most important thing you can do to help yourself. TREATMENT   Your caregiver may prescribe an antibiotic if the cough is caused by bacteria. Also, medicines that open up your airways make it easier to breathe. Your caregiver may also recommend or prescribe an expectorant. It will loosen the mucus to be coughed up. Only take over-the-counter or prescription medicines for pain, discomfort, or fever as directed by your caregiver.  Removing whatever causes the problem (smoking, for example) is critical to preventing the problem from getting worse.  Cough suppressants may be prescribed for relief of cough symptoms.  Inhaled medicines may be prescribed to help with symptoms now and to help prevent problems from returning.  For those with recurrent (chronic) bronchitis, there may be a need for steroid medicines. SEEK IMMEDIATE MEDICAL CARE IF:   During  treatment, you develop more pus-like mucus (purulent sputum).  You have a fever.  Your baby is older than 3 months with a rectal temperature of 102 F (38.9 C) or higher.  Your baby is 7 months old or younger with a rectal temperature of 100.4 F (38 C) or higher.  You become progressively more ill.  You have increased difficulty breathing, wheezing, or shortness of breath. It is necessary to seek immediate medical care if you are elderly or sick from any other disease. MAKE SURE YOU:   Understand these instructions.  Will watch your condition.  Will get help right away if you are not doing well or get worse. Document Released: 08/11/2005 Document Revised: 11/03/2011 Document Reviewed: 06/20/2008 [facility] Patient Information 2013 [place].

## 2012-12-22 ENCOUNTER — Telehealth: Payer: Self-pay | Admitting: Internal Medicine

## 2012-12-22 MED ORDER — VALACYCLOVIR HCL 500 MG PO TABS: 500.0000 mg | ORAL_TABLET | Freq: Two times a day (BID) | ORAL | Status: DC

## 2012-12-22 NOTE — Telephone Encounter (Signed)
Pt would like a Rx for Valtrex for cold sore,  Please advise.

## 2012-12-22 NOTE — Telephone Encounter (Signed)
OK  500 mg  #30  One bid for 5 days for acute outbreak

## 2012-12-22 NOTE — Telephone Encounter (Signed)
Patient Information:  Caller Name: Raya  Phone: [phone]  Patient: Paige, Spears  Gender: Female  DOB: 06/26/1956  Age: 57 Years  PCP: Bluford Kaufmann [facility])  Office Follow Up:  Does the office need to follow up with this patient?: Yes  Instructions For The Office: REQUESTING PRESCRIPTION FOR COLD [place].   PLEASE CONTACT PATIENT FOR UPDATE  RN Note:  Pharmacy [facility] [phone].  Patient is requesting Rx.  PLEASE CONTACT PATIENT REGARDING RX.  Care advise and call back parameters reviewed.  Symptoms  Reason For Call & Symptoms: Patient is requesting prescription for Fever blister x1 on lower middle of the lip. Onset two days ago.  She was given Valtrex in the past.  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  Typically last 7-10 days.  Treatment with a cold sore cream can reduce the pain and shorten the course by a day or 2.  Docosanol 10% Cream:  Apply over-the-counter docosanol cream (trade name Abreva) to the cold sore 5 times daily until healing occurs.  Begin using this cream as soon as you first sense the beginning of an outbreak.  Contagiousness:  Herpes from cold sores is contagious to other people. Discourage picking or rubbing the sore. Don't open the blisters. Wash your hands frequently. The cold sores are contagious until dry (approximately 5-7 days). Most cold sore sufferers note a tingling in the lip before the sore appears  (prodromal phase). Patients are also contagious during this period.  Contagiousness:  Herpes from cold sores is contagious to other people. Discourage picking or rubbing the sore. Don't open the blisters. Wash your hands frequently. The cold sores are contagious until dry (approximately 5-7 days). Most cold sore sufferers note a tingling in the lip before the sore appears (prodromal phase). Patients are also contagious during this period.  Mouth - Since the blisters and mouth secretions are contagious, avoid kissing other people during this time. Avoid sharing drinking glasses, eating utensils, or razors.  Contagiousness:  Herpes from cold sores is contagious to other people. Discourage picking or rubbing the sore. Don't open the blisters. Wash your hands frequently. The cold sores are contagious until dry (approximately 5-7 days). Most cold sore sufferers note a tingling in the lip before the sore appears (prodromal phase). Patients are also contagious during this period.  Mouth - Since the blisters and mouth secretions are contagious, avoid kissing other people during this time. Avoid sharing drinking glasses, eating utensils, or razors.  Sex - Avoid oral sex during this time. Herpes from sores on your mouth can spread to your partner's genital area.  Contact with Immunocompromised People - Avoid contact with anyone who has eczema or a weakened immune system.  Expected Course:  The pain typically subsides over 4-5 days and the sores typically heal over a 7-10 day period. On average, patients note recurrences 2-3 times per year.  Prevention:  Since cold sores are often triggered by exposure  to intense sunlight, use a lip balm containing a sunscreen (SPF 30 or higher).  Call Back If:  Sores look infected (spreading redness)  Sores occur near or in the eye  You become worse  Patient Will Follow Care Advice:  YES

## 2012-12-22 NOTE — Telephone Encounter (Signed)
PT called and requested to have this rx sent to [facility] on [address]. Please assist.

## 2012-12-22 NOTE — Telephone Encounter (Signed)
Left message Rx requested was sent to pharmacy.

## 2013-02-14 ENCOUNTER — Encounter: Payer: Self-pay | Admitting: Internal Medicine

## 2013-02-14 ENCOUNTER — Ambulatory Visit (INDEPENDENT_AMBULATORY_CARE_PROVIDER_SITE_OTHER): Payer: Managed Care, Other (non HMO) | Admitting: Internal Medicine

## 2013-02-14 VITALS — BP 120/80 | HR 67 | Temp 98.7°F | Resp 18 | Wt 137.0 lb

## 2013-02-14 DIAGNOSIS — F411 Generalized anxiety disorder: Secondary | ICD-10-CM

## 2013-02-14 DIAGNOSIS — F329 Major depressive disorder, single episode, unspecified: Secondary | ICD-10-CM

## 2013-02-14 DIAGNOSIS — F3289 Other specified depressive episodes: Secondary | ICD-10-CM

## 2013-02-14 NOTE — Patient Instructions (Signed)
Return in one month for followup  Decrease lorazepam to one half tablet nightly for 2 weeks and then every other night for one week then discontinue  Decrease proxy team to one half tablet nightly for 3 weeks and then discontinue

## 2013-02-14 NOTE — Progress Notes (Signed)
Subjective:    Patient ID: [name], female    DOB: 1956/04/28, 57 y.o.   MRN: [id number]  HPI  57 year old patient who has a history of anxiety depression she has been on lorazepam and paroxetine at bedtime for treatment.  Today she complains of  memory deficit;  she complains of dizziness fatigue and general sense of unwellness. She describes some occasional nausea. 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  She feels that her job performance was hindered by her inability to focus and concentrate. She states that time she has word finding difficulties and at times substitutes wrong words. She  becomes forgetful and often loses her train of thought. She has a much more difficult time remembering names.  She and her husband are concerned that this may be side effect of medications Additional complaints include nausea dry throat and some dizziness and equilibrium issues. She states when she sits for prolonged her to time she develops tingling and a sense of restlessness in her legs forcing her to retire early. She has no nocturnal symptoms  MMSE performed with a score of 29 of 30 (able to recall only 2 of 3 objects later in the exam)   Past Medical History  Diagnosis Date  . Colitis   . Anxiety   . Migraine   . Depression   . Palpitations     History   Social History  . Marital Status: Married    Spouse Name: N/A    Number of Children: 3  . Years of Education: N/A   Occupational History  . insurance    Social History Main Topics  . Smoking status: Current Every Day Smoker -- 1.00 packs/day    Types: Cigarettes  . Smokeless tobacco: Never Used  . Alcohol Use: Yes  . Drug Use: No  . Sexually Active: Not on file   Other Topics Concern  . Not on file   Social History Narrative   Divorced   3 children          Past Surgical History  Procedure Laterality Date  . Tonsillectomy      Family History  Problem Relation Age of Onset  .  Prostate cancer Father   . Hypertension Mother     No Known Allergies  Current Outpatient Prescriptions on File Prior to Visit  Medication Sig Dispense Refill  . LORazepam (ATIVAN) 1 MG tablet Take 1 tablet (1 mg total) by mouth at bedtime as needed for anxiety.  90 tablet  2  . PARoxetine (PAXIL) 20 MG tablet Take 1 tablet (20 mg total) by mouth daily.  90 tablet  6  . valACYclovir (VALTREX) 500 MG tablet Take 1 tablet (500 mg total) by mouth 2 (two) times daily. X 5 days for acute outbreaks  30 tablet  0   No current facility-administered medications on file prior to visit.    BP 120/80  Pulse 67  Temp(Src) 98.7 F (37.1 C) (Oral)  Resp 18  Wt 137 lb (62.143 kg)  BMI 22.8 kg/m2  SpO2 99%       Review of Systems  Constitutional: Positive for fatigue.  HENT: Negative for hearing loss, congestion, sore throat, rhinorrhea, dental problem, sinus pressure and tinnitus.   Eyes: Negative for pain, discharge and visual disturbance.  Respiratory: Negative for cough and shortness of breath.   Cardiovascular: Negative for chest pain, palpitations and leg swelling.  Gastrointestinal: Positive for nausea. Negative for vomiting, abdominal  pain, diarrhea, constipation, blood in stool and abdominal distention.  Genitourinary: Negative for dysuria, urgency, frequency, hematuria, flank pain, vaginal bleeding, vaginal discharge, difficulty urinating, vaginal pain and pelvic pain.  Musculoskeletal: Negative for joint swelling, arthralgias and gait problem.  Skin: Negative for rash.  Neurological: Positive for light-headedness. Negative for dizziness, syncope, speech difficulty, weakness, numbness and headaches.  Hematological: Negative for adenopathy.  Psychiatric/Behavioral: Positive for decreased concentration. Negative for behavioral problems, dysphoric mood and agitation. The patient is nervous/anxious.        Objective:   Physical Exam  Constitutional: She is oriented to person,  place, and time. She appears well-developed and well-nourished.  HENT:  Head: Normocephalic.  Right Ear: External ear normal.  Left Ear: External ear normal.  Mouth/Throat: Oropharynx is clear and moist.  Eyes: Conjunctivae and EOM are normal. Pupils are equal, round, and reactive to light.  Neck: Normal range of motion. Neck supple. No thyromegaly present.  Cardiovascular: Normal rate, regular rhythm, normal heart sounds and intact distal pulses.   Pulmonary/Chest: Effort normal and breath sounds normal.  Abdominal: Soft. Bowel sounds are normal. She exhibits no mass. There is no tenderness.  Musculoskeletal: Normal range of motion.  Lymphadenopathy:    She has no cervical adenopathy.  Neurological: She is alert and oriented to person, place, and time. She has normal reflexes. No cranial nerve deficit. Coordination normal.  Skin: Skin is warm and dry. No rash noted.  Psychiatric: She has a normal mood and affect. Her behavior is normal.          Assessment & Plan:    History memory loss. We'll taper and discontinue both proximal is seen in lorazepam and recheck in 4 weeks. Her neurologic examination is unremarkable. She has had some recent unremarkable lab. History of anxiety depression Normal MMSE

## 2013-04-25 ENCOUNTER — Ambulatory Visit (INDEPENDENT_AMBULATORY_CARE_PROVIDER_SITE_OTHER): Payer: Medicare FFS | Admitting: Emergency Medicine

## 2013-04-25 VITALS — BP 120/70 | HR 68 | Temp 98.0°F | Resp 16 | Ht 64.5 in | Wt 130.4 lb

## 2013-04-25 DIAGNOSIS — R350 Frequency of micturition: Secondary | ICD-10-CM

## 2013-04-25 DIAGNOSIS — N3000 Acute cystitis without hematuria: Secondary | ICD-10-CM

## 2013-04-25 LAB — POCT UA - MICROSCOPIC ONLY
Casts, Ur, LPF, POC: NEGATIVE
Mucus, UA: NEGATIVE
Yeast, UA: NEGATIVE

## 2013-04-25 LAB — POCT URINALYSIS DIPSTICK
Bilirubin, UA: NEGATIVE
Ketones, UA: NEGATIVE
Protein, UA: NEGATIVE
Spec Grav, UA: 1.02
pH, UA: 5.5

## 2013-04-25 MED ORDER — SULFAMETHOXAZOLE-TRIMETHOPRIM 800-160 MG PO TABS: 1.0000 | ORAL_TABLET | Freq: Two times a day (BID) | ORAL | Status: DC

## 2013-04-25 MED ORDER — PHENAZOPYRIDINE HCL 200 MG PO TABS: 200.0000 mg | ORAL_TABLET | Freq: Three times a day (TID) | ORAL | Status: DC | PRN

## 2013-04-25 NOTE — Patient Instructions (Addendum)
Urinary Tract Infection  Urinary tract infections (UTIs) can develop anywhere along your urinary tract. Your urinary tract is your body's drainage system for removing wastes and extra water. Your urinary tract includes two kidneys, two ureters, a bladder, and a urethra. Your kidneys are a pair of bean-shaped organs. Each kidney is about the size of your fist. They are located below your ribs, one on each side of your spine.  CAUSES  Infections are caused by microbes, which are microscopic organisms, including fungi, viruses, and bacteria. These organisms are so small that they can only be seen through a microscope. Bacteria are the microbes that most commonly cause UTIs.  SYMPTOMS   Symptoms of UTIs may vary by age and gender of the patient and by the location of the infection. Symptoms in young women typically include a frequent and intense urge to urinate and a painful, burning feeling in the bladder or urethra during urination. Older women and men are more likely to be tired, shaky, and weak and have muscle aches and abdominal pain. A fever may mean the infection is in your kidneys. Other symptoms of a kidney infection include pain in your back or sides below the ribs, nausea, and vomiting.  DIAGNOSIS  To diagnose a UTI, your caregiver will ask you about your symptoms. Your caregiver also will ask to provide a urine sample. The urine sample will be tested for bacteria and white blood cells. White blood cells are made by your body to help fight infection.  TREATMENT   Typically, UTIs can be treated with medication. Because most UTIs are caused by a bacterial infection, they usually can be treated with the use of antibiotics. The choice of antibiotic and length of treatment depend on your symptoms and the type of bacteria causing your infection.  HOME CARE INSTRUCTIONS   If you were prescribed antibiotics, take them exactly as your caregiver instructs you. Finish the medication even if you feel better after you  have only taken some of the medication.   Drink enough water and fluids to keep your urine clear or pale yellow.   Avoid caffeine, tea, and carbonated beverages. They tend to irritate your bladder.   Empty your bladder often. Avoid holding urine for long periods of time.   Empty your bladder before and after sexual intercourse.   After a bowel movement, women should cleanse from front to back. Use each tissue only once.  SEEK MEDICAL CARE IF:    You have back pain.   You develop a fever.   Your symptoms do not begin to resolve within 3 days.  SEEK IMMEDIATE MEDICAL CARE IF:    You have severe back pain or lower abdominal pain.   You develop chills.   You have nausea or vomiting.   You have continued burning or discomfort with urination.  MAKE SURE YOU:    Understand these instructions.   Will watch your condition.   Will get help right away if you are not doing well or get worse.  Document Released: 05/21/2005 Document Revised: 02/10/2012 Document Reviewed: 09/19/2011  [employer] Patient Information 2014 [employer].

## 2013-04-25 NOTE — Progress Notes (Signed)
[facility] [address], [place] [address] [phone]  Date:  04/25/2013   Name:  [name]   DOB:  01-03-1956   MRN:  [id number]  PCP:  Nyoka Cowden, MD    Chief Complaint: Urinary Frequency   History of Present Illness:  [name] is a 57 y.o. very pleasant female patient who presents with the following:  Has 4 day history dysuria and urgency and frequency. Has developed low back pain yesterday.  Has had nausea last night and a low grade fever.  No stool change, GYN symptoms.  No improvement with over the counter medications or other home remedies. Denies other complaint or health concern today.   Patient Active Problem List   Diagnosis Date Noted  . Tingling of skin 10/10/2011  . Dry skin 10/10/2011  . Itching 10/10/2011  . Insomnia 10/10/2011  . Imbalance 10/10/2011  . Stress 10/10/2011  . Colitis 02/17/2011  . DEPRESSION 10/29/2009  . PALPITATIONS, HX OF 10/29/2009  . ANXIETY 04/11/2008  . ABDOMINAL PAIN, GENERALIZED 08/30/2007  . PNEUMONIA 07/03/2007    Past Medical History  Diagnosis Date  . Colitis   . Anxiety   . Migraine   . Depression   . Palpitations     Past Surgical History  Procedure Laterality Date  . Tonsillectomy      History  Substance Use Topics  . Smoking status: Current Every Day Smoker -- 1.00 packs/day    Types: Cigarettes  . Smokeless tobacco: Never Used  . Alcohol Use: Yes    Family History  Problem Relation Age of Onset  . Prostate cancer Father   . Hypertension Mother     No Known Allergies  Medication list has been reviewed and updated.  Current Outpatient Prescriptions on File Prior to Visit  Medication Sig Dispense Refill  . valACYclovir (VALTREX) 500 MG tablet Take 1 tablet (500 mg total) by mouth 2 (two) times daily. X 5 days for acute outbreaks  30 tablet  0   No current facility-administered medications on file prior to visit.    Review of Systems:  As per HPI,  otherwise negative.    Physical Examination: Filed Vitals:   04/25/13 1404  BP: 120/70  Pulse: 68  Temp: 98 F (36.7 C)  Resp: 16   Filed Vitals:   04/25/13 1404  Height: 5' 4.5" (1.638 m)  Weight: 130 lb 6.4 oz (59.149 kg)   Body mass index is 22.05 kg/(m^2). Ideal Body Weight: Weight in (lb) to have BMI = 25: 147.6  GEN: WDWN, NAD, Non-toxic, A & O x 3 HEENT: Atraumatic, Normocephalic. Neck supple. No masses, No LAD. Ears and Nose: No external deformity. CV: RRR, No M/G/R. No JVD. No thrill. No extra heart sounds. PULM: CTA B, no wheezes, crackles, rhonchi. No retractions. No resp. distress. No accessory muscle use. ABD: S, NT, ND, +BS. No rebound. No HSM. EXTR: No c/c/e NEURO Normal gait.  PSYCH: Normally interactive. Conversant. Not depressed or anxious appearing.  Calm demeanor.    Assessment and Plan: Cystitis Pyridium Septra  Signed,  Ellison Carwin, MD   Results for orders placed in visit on 04/25/13  POCT URINALYSIS DIPSTICK      Result Value Range   Color, UA yellow     Clarity, UA cloudy     Glucose, UA negative     Bilirubin, UA negative     Ketones, UA negative     Spec Grav, UA 1.020  Blood, UA large     pH, UA 5.5     Protein, UA negative     Urobilinogen, UA 0.2     Nitrite, UA positive     Leukocytes, UA small (1+)    POCT UA - MICROSCOPIC ONLY      Result Value Range   WBC, Ur, HPF, POC 10-19     RBC, urine, microscopic 1-3     Bacteria, U Microscopic 1+     Mucus, UA negative     Epithelial cells, urine per micros 1-5     Crystals, Ur, HPF, POC negative     Casts, Ur, LPF, POC negatvie     Yeast, UA negative

## 2013-06-17 ENCOUNTER — Encounter: Payer: Self-pay | Admitting: Internal Medicine

## 2013-06-17 ENCOUNTER — Ambulatory Visit (INDEPENDENT_AMBULATORY_CARE_PROVIDER_SITE_OTHER): Payer: Managed Care, Other (non HMO) | Admitting: Internal Medicine

## 2013-06-17 VITALS — BP 120/70 | HR 73 | Temp 98.4°F | Resp 20 | Wt 132.0 lb

## 2013-06-17 DIAGNOSIS — M542 Cervicalgia: Secondary | ICD-10-CM

## 2013-06-17 MED ORDER — METHYLPREDNISOLONE ACETATE 80 MG/ML IJ SUSP
80.0000 mg | Freq: Once | INTRAMUSCULAR | Status: AC
  Administered 2013-06-17: 80 mg via INTRAMUSCULAR

## 2013-06-17 MED ORDER — TRAMADOL HCL 50 MG PO TABS
50.0000 mg | ORAL_TABLET | Freq: Three times a day (TID) | ORAL | Status: DC | PRN
Start: 2013-06-17 — End: 2014-01-20

## 2013-06-17 NOTE — Progress Notes (Signed)
  Subjective:    Patient ID: [name], female    DOB: July 19, 1956, 57 y.o.   MRN: [id number]  HPI 57 year old patient who presents with a chief complaint of left posterior neck pain and occasional paresthesias involving the left arm;  this began 4 days ago and   Has persisted in spite of ibuprofen and acetaminophen.  She states that 2 days prior she was involved with moving heavy furniture that included a heavy bed down a flight of stairs. No motor weakness.  Past Medical History  Diagnosis Date  . Colitis   . Anxiety   . Migraine   . Depression   . Palpitations     History   Social History  . Marital Status: Married    Spouse Name: N/A    Number of Children: 3  . Years of Education: N/A   Occupational History  . insurance    Social History Main Topics  . Smoking status: Current Every Day Smoker -- 1.00 packs/day    Types: Cigarettes  . Smokeless tobacco: Never Used  . Alcohol Use: Yes  . Drug Use: No  . Sexual Activity: Not on file   Other Topics Concern  . Not on file   Social History Narrative   Divorced   3 children          Past Surgical History  Procedure Laterality Date  . Tonsillectomy      Family History  Problem Relation Age of Onset  . Prostate cancer Father   . Hypertension Mother     No Known Allergies  No current outpatient prescriptions on file prior to visit.   No current facility-administered medications on file prior to visit.    BP 120/70  Pulse 73  Temp(Src) 98.4 F (36.9 C) (Oral)  Resp 20  Wt 132 lb (59.875 kg)  BMI 22.32 kg/m2  SpO2 98%       Review of Systems  Musculoskeletal: Positive for neck pain.  Neurological: Negative for weakness.       Objective:   Physical Exam  Constitutional: She appears well-developed and well-nourished. No distress.  Neck: Normal range of motion. Neck supple.  Musculoskeletal:  No tenderness to palpation involving the posterior neck. Range of motion was minimally diminished  with head turning to the left. This did tend to aggravate her left posterior neck pain  Neurological:  Normal grip strength Normal arm extension and flexion Normal reflexes No sensory deficits          Assessment & Plan:  Left posterior neck pain/strain. We'll treat with Depo-Medrol and observe. We'll treat with the tramadol. It

## 2013-06-17 NOTE — Patient Instructions (Signed)
You  may move around, but avoid painful motions and activities.  Apply  Heat to the sore area for 15 to 20 minutes 3 or 4 times daily for the next two to 3 days.Cervical Sprain A cervical sprain is an injury in the neck in which the ligaments are stretched or torn. The ligaments are the tissues that hold the bones of the neck (vertebrae) in place.Cervical sprains can range from very mild to very severe. Most cervical sprains get better in 1 to 3 weeks, but it depends on the cause and extent of the injury. Severe cervical sprains can cause the neck vertebrae to be unstable. This can lead to damage of the spinal cord and can result in serious nervous system problems. Your caregiver will determine whether your cervical sprain is mild or severe. CAUSES  Severe cervical sprains may be caused by:  Contact sport injuries (football, rugby, wrestling, hockey, auto racing, gymnastics, diving, martial arts, boxing).  Motor vehicle collisions.  Whiplash injuries. This means the neck is forcefully whipped backward and forward.  Falls. Mild cervical sprains may be caused by:   Awkward positions, such as cradling a telephone between your ear and shoulder.  Sitting in a chair that does not offer proper support.  Working at a poorly Landscape architect station.  Activities that require looking up or down for long periods of time. SYMPTOMS   Pain, soreness, stiffness, or a burning sensation in the front, back, or sides of the neck. This discomfort may develop immediately after injury or it may develop slowly and not begin for 24 hours or more after an injury.  Pain or tenderness directly in the middle of the back of the neck.  Shoulder or upper back pain.  Limited ability to move the neck.  Headache.  Dizziness.  Weakness, numbness, or tingling in the hands or arms.  Muscle spasms.  Difficulty swallowing or chewing.  Tenderness and swelling of the neck. DIAGNOSIS  Most of the time, your  caregiver can diagnose this problem by taking your history and doing a physical exam. Your caregiver will ask about any known problems, such as arthritis in the neck or a previous neck injury. X-rays may be taken to find out if there are any other problems, such as problems with the bones of the neck. However, an X-ray often does not reveal the full extent of a cervical sprain. Other tests such as a computed tomography (CT) scan or magnetic resonance imaging (MRI) may be needed. TREATMENT  Treatment depends on the severity of the cervical sprain. Mild sprains can be treated with rest, keeping the neck in place (immobilization), and pain medicines. Severe cervical sprains need immediate immobilization and an appointment with an orthopedist or neurosurgeon. Several treatment options are available to help with pain, muscle spasms, and other symptoms. Your caregiver may prescribe:  Medicines, such as pain relievers, numbing medicines, or muscle relaxants.  Physical therapy. This can include stretching exercises, strengthening exercises, and posture training. Exercises and improved posture can help stabilize the neck, strengthen muscles, and help stop symptoms from returning.  A neck collar to be worn for short periods of time. Often, these collars are worn for comfort. However, certain collars may be worn to protect the neck and prevent further worsening of a serious cervical sprain. HOME CARE INSTRUCTIONS   Put ice on the injured area.  Put ice in a plastic bag.  Place a towel between your skin and the bag.  Leave the ice on for 15-20  minutes, 3-4 times a day.  Only take over-the-counter or prescription medicines for pain, discomfort, or fever as directed by your caregiver.  Keep all follow-up appointments as directed by your caregiver.  Keep all physical therapy appointments as directed by your caregiver.  If a neck collar is prescribed, wear it as directed by your caregiver.  Do not drive  while wearing a neck collar.  Make any needed adjustments to your work station to promote good posture.  Avoid positions and activities that make your symptoms worse.  Warm up and stretch before being active to help prevent problems. SEEK MEDICAL CARE IF:   Your pain is not controlled with medicine.  You are unable to decrease your pain medicine over time as planned.  Your activity level is not improving as expected. SEEK IMMEDIATE MEDICAL CARE IF:   You develop any bleeding, stomach upset, or signs of an allergic reaction to your medicine.  Your symptoms get worse.  You develop new, unexplained symptoms.  You have numbness, tingling, weakness, or paralysis in any part of your body. MAKE SURE YOU:   Understand these instructions.  Will watch your condition.  Will get help right away if you are not doing well or get worse. Document Released: 06/08/2007 Document Revised: 11/03/2011 Document Reviewed: 05/14/2011 [facility] Patient Information 2014 [place], [place].

## 2013-11-01 ENCOUNTER — Ambulatory Visit (INDEPENDENT_AMBULATORY_CARE_PROVIDER_SITE_OTHER): Payer: Medicare FFS | Admitting: Family Medicine

## 2013-11-01 VITALS — BP 122/80 | HR 78 | Temp 98.0°F | Resp 16 | Ht 65.0 in | Wt 129.0 lb

## 2013-11-01 DIAGNOSIS — L258 Unspecified contact dermatitis due to other agents: Secondary | ICD-10-CM

## 2013-11-01 DIAGNOSIS — L853 Xerosis cutis: Secondary | ICD-10-CM

## 2013-11-01 DIAGNOSIS — L259 Unspecified contact dermatitis, unspecified cause: Secondary | ICD-10-CM

## 2013-11-01 MED ORDER — TRIAMCINOLONE ACETONIDE 0.1 % EX CREA: 1.0000 "application " | TOPICAL_CREAM | Freq: Two times a day (BID) | CUTANEOUS | Status: DC | PRN

## 2013-11-01 NOTE — Patient Instructions (Signed)
Drink at least 64 ounces of water daily. Consider a humidifier for the room where you sleep. Bathe once daily. Avoid using HOT water, as it dries skin.  Avoid deodorant soaps (Dial is the worst!) and stick with gentle cleansers (I like Cetaphil Liquid Cleanser). After bathing, dry off completely, then apply a thick emollient cream (I like Cetaphil Moisturizing Cream, Aveeno, or Eucerin).  Apply the cream twice daily, or more!  Triamcinolone cream up to twice per day if needed to itchy areas. If not improving into next week, let me know. Return to the clinic or go to the nearest emergency room if any of your symptoms worsen or new symptoms occur.    Contact Dermatitis Contact dermatitis is a reaction to certain substances that touch the skin. Contact dermatitis can be either irritant contact dermatitis or allergic contact dermatitis. Irritant contact dermatitis does not require previous exposure to the substance for a reaction to occur.Allergic contact dermatitis only occurs if you have been exposed to the substance before. Upon a repeat exposure, your body reacts to the substance.  CAUSES  Many substances can cause contact dermatitis. Irritant dermatitis is most commonly caused by repeated exposure to mildly irritating substances, such as:  Makeup.  Soaps.  Detergents.  Bleaches.  Acids.  Metal salts, such as nickel. Allergic contact dermatitis is most commonly caused by exposure to:  Poisonous plants.  Chemicals (deodorants, shampoos).  Jewelry.  Latex.  Neomycin in triple antibiotic cream.  Preservatives in products, including clothing. SYMPTOMS  The area of skin that is exposed may develop:  Dryness or flaking.  Redness.  Cracks.  Itching.  Pain or a burning sensation.  Blisters. With allergic contact dermatitis, there may also be swelling in areas such as the eyelids, mouth, or genitals.  DIAGNOSIS  Your caregiver can usually tell what the problem is by  doing a physical exam. In cases where the cause is uncertain and an allergic contact dermatitis is suspected, a patch skin test may be performed to help determine the cause of your dermatitis. TREATMENT Treatment includes protecting the skin from further contact with the irritating substance by avoiding that substance if possible. Barrier creams, powders, and gloves may be helpful. Your caregiver may also recommend:  Steroid creams or ointments applied 2 times daily. For best results, soak the rash area in cool water for 20 minutes. Then apply the medicine. Cover the area with a plastic wrap. You can store the steroid cream in the refrigerator for a "chilly" effect on your rash. That may decrease itching. Oral steroid medicines may be needed in more severe cases.  Antibiotics or antibacterial ointments if a skin infection is present.  Antihistamine lotion or an antihistamine taken by mouth to ease itching.  Lubricants to keep moisture in your skin.  Burow's solution to reduce redness and soreness or to dry a weeping rash. Mix one packet or tablet of solution in 2 cups cool water. Dip a clean washcloth in the mixture, wring it out a bit, and put it on the affected area. Leave the cloth in place for 30 minutes. Do this as often as possible throughout the day.  Taking several cornstarch or baking soda baths daily if the area is too large to cover with a washcloth. Harsh chemicals, such as alkalis or acids, can cause skin damage that is like a burn. You should flush your skin for 15 to 20 minutes with cold water after such an exposure. You should also seek immediate medical care after  exposure. Bandages (dressings), antibiotics, and pain medicine may be needed for severely irritated skin.  HOME CARE INSTRUCTIONS  Avoid the substance that caused your reaction.  Keep the area of skin that is affected away from hot water, soap, sunlight, chemicals, acidic substances, or anything else that would irritate  your skin.  Do not scratch the rash. Scratching may cause the rash to become infected.  You may take cool baths to help stop the itching.  Only take over-the-counter or prescription medicines as directed by your caregiver.  See your caregiver for follow-up care as directed to make sure your skin is healing properly. SEEK MEDICAL CARE IF:   Your condition is not better after 3 days of treatment.  You seem to be getting worse.  You see signs of infection such as swelling, tenderness, redness, soreness, or warmth in the affected area.  You have any problems related to your medicines. Document Released: 08/08/2000 Document Revised: 11/03/2011 Document Reviewed: 01/14/2011 [facility] Patient Information 2014 [place], [place].

## 2013-11-01 NOTE — Progress Notes (Signed)
This chart was scribed for Merri Ray, MD by Einar Pheasant, ED Scribe. This patient was seen in room 2 and the patient's care was started at 6:34 PM. Subjective:    Patient ID: [name], female    DOB: 10-26-1955, 58 y.o.   MRN: [id number] Chief Complaint  Patient presents with  . Rash    HPI HPI Comments: [name] is a 58 y.o. female  PCP: Nyoka Cowden, MD  Pt is complaining of a worsening rash that started 1 weeks ago. She states that she's had the rash before and it comes an goes. Rash is located on her upper chest and lower back. Pt states that her last flare up was last year. She states that she usually tries to deal with it with OTC ointments, but they usually don't work. She says that for her last flare up, she was prescribed Prednisone. For this recent occurrence she denies using any OTC medications. She says that she's been washing it and trying to keep it dry. [name] states that she recently changed moisturizers and she thinks that the current change may have caused the onset of the rash. She denies any rash on her arms, armpit, or face. Denies any fever, chills, diaphoresis, nausea, emesis, abdominal pain. She denies any recent exposure to new materials.    Patient Active Problem List   Diagnosis Date Noted  . Tingling of skin 10/10/2011  . Dry skin 10/10/2011  . Itching 10/10/2011  . Insomnia 10/10/2011  . Imbalance 10/10/2011  . Stress 10/10/2011  . Colitis 02/17/2011  . DEPRESSION 10/29/2009  . PALPITATIONS, HX OF 10/29/2009  . ANXIETY 04/11/2008  . ABDOMINAL PAIN, GENERALIZED 08/30/2007  . PNEUMONIA 07/03/2007   Past Medical History  Diagnosis Date  . Colitis   . Anxiety   . Migraine   . Depression   . Palpitations    Past Surgical History  Procedure Laterality Date  . Tonsillectomy     No Known Allergies Prior to Admission medications   Medication Sig Start Date End Date Taking? Authorizing Provider  acetaminophen  (TYLENOL) 325 MG tablet Take 650 mg by mouth as needed for pain.    Historical Provider, MD  ibuprofen (ADVIL,MOTRIN) 200 MG tablet Take 400 mg by mouth as needed for pain.    Historical Provider, MD  traMADol (ULTRAM) 50 MG tablet Take 1 tablet (50 mg total) by mouth every 8 (eight) hours as needed for pain. 06/17/13   Marletta Lor, MD   History   Social History  . Marital Status: Married    Spouse Name: N/A    Number of Children: 3  . Years of Education: N/A   Occupational History  . insurance    Social History Main Topics  . Smoking status: Current Every Day Smoker -- 1.00 packs/day    Types: Cigarettes  . Smokeless tobacco: Never Used  . Alcohol Use: Yes  . Drug Use: No  . Sexual Activity: Not on file   Other Topics Concern  . Not on file   Social History Narrative   Divorced   3 children         Review of Systems  Constitutional: Negative for unexpected weight change.  Respiratory: Negative for chest tightness.   Cardiovascular: Negative for chest pain, palpitations and leg swelling.  Gastrointestinal: Negative for abdominal pain and blood in stool.  Skin: Positive for rash.  Neurological: Negative for dizziness, syncope, light-headedness and headaches.       Objective:  Physical Exam  Vitals reviewed. Constitutional: She is oriented to person, place, and time. She appears well-developed and well-nourished.  HENT:  Head: Normocephalic and atraumatic.  Eyes: Conjunctivae and EOM are normal. Pupils are equal, round, and reactive to light.  Neck: Carotid bruit is not present.  Cardiovascular: Normal rate, regular rhythm, normal heart sounds and intact distal pulses.   Pulmonary/Chest: Effort normal and breath sounds normal.  Abdominal: Soft. She exhibits no pulsatile midline mass. There is no tenderness.  Neurological: She is alert and oriented to person, place, and time.  Skin: Skin is warm and dry.  She has approximately 8-10 excoriated papules  across the chest wall, no tracks, no discharge, mildly erythematous and not indurated. Below the bra line on the back some dry skin noted. 1-2 excoriated papules.   Right maxillary process she has 1 small papule. No arm lesion or interdigital lesions.   Psychiatric: She has a normal mood and affect. Her behavior is normal.     Filed Vitals:   11/01/13 1833  BP: 122/80  Pulse: 78  Temp: 98 F (36.7 C)  TempSrc: Oral  Resp: 16  Height: 5' 5"  (1.651 m)  Weight: 129 lb (58.514 kg)  SpO2: 98%        Assessment & Plan:   [name] is a 58 y.o. female Dry skin dermatitis  Contact dermatitis - Plan: triamcinolone cream (KENALOG) 0.1 %  Suspect dry skin dermatitis, plus/minus contact derm.  Dry skin care discussed below, TAC 0.1% BID prn, rtc precautions.   Meds ordered this encounter  Medications  . triamcinolone cream (KENALOG) 0.1 %    Sig: Apply 1 application topically 2 (two) times daily as needed.    Dispense:  30 g    Refill:  1   Patient Instructions  Drink at least 64 ounces of water daily. Consider a humidifier for the room where you sleep. Bathe once daily. Avoid using HOT water, as it dries skin.  Avoid deodorant soaps (Dial is the worst!) and stick with gentle cleansers (I like Cetaphil Liquid Cleanser). After bathing, dry off completely, then apply a thick emollient cream (I like Cetaphil Moisturizing Cream, Aveeno, or Eucerin).  Apply the cream twice daily, or more!  Triamcinolone cream up to twice per day if needed to itchy areas. If not improving into next week, let me know. Return to the clinic or go to the nearest emergency room if any of your symptoms worsen or new symptoms occur.    Contact Dermatitis Contact dermatitis is a reaction to certain substances that touch the skin. Contact dermatitis can be either irritant contact dermatitis or allergic contact dermatitis. Irritant contact dermatitis does not require previous exposure to the substance for  a reaction to occur.Allergic contact dermatitis only occurs if you have been exposed to the substance before. Upon a repeat exposure, your body reacts to the substance.  CAUSES  Many substances can cause contact dermatitis. Irritant dermatitis is most commonly caused by repeated exposure to mildly irritating substances, such as:  Makeup.  Soaps.  Detergents.  Bleaches.  Acids.  Metal salts, such as nickel. Allergic contact dermatitis is most commonly caused by exposure to:  Poisonous plants.  Chemicals (deodorants, shampoos).  Jewelry.  Latex.  Neomycin in triple antibiotic cream.  Preservatives in products, including clothing. SYMPTOMS  The area of skin that is exposed may develop:  Dryness or flaking.  Redness.  Cracks.  Itching.  Pain or a burning sensation.  Blisters. With allergic contact  dermatitis, there may also be swelling in areas such as the eyelids, mouth, or genitals.  DIAGNOSIS  Your caregiver can usually tell what the problem is by doing a physical exam. In cases where the cause is uncertain and an allergic contact dermatitis is suspected, a patch skin test may be performed to help determine the cause of your dermatitis. TREATMENT Treatment includes protecting the skin from further contact with the irritating substance by avoiding that substance if possible. Barrier creams, powders, and gloves may be helpful. Your caregiver may also recommend:  Steroid creams or ointments applied 2 times daily. For best results, soak the rash area in cool water for 20 minutes. Then apply the medicine. Cover the area with a plastic wrap. You can store the steroid cream in the refrigerator for a "chilly" effect on your rash. That may decrease itching. Oral steroid medicines may be needed in more severe cases.  Antibiotics or antibacterial ointments if a skin infection is present.  Antihistamine lotion or an antihistamine taken by mouth to ease itching.  Lubricants  to keep moisture in your skin.  Burow's solution to reduce redness and soreness or to dry a weeping rash. Mix one packet or tablet of solution in 2 cups cool water. Dip a clean washcloth in the mixture, wring it out a bit, and put it on the affected area. Leave the cloth in place for 30 minutes. Do this as often as possible throughout the day.  Taking several cornstarch or baking soda baths daily if the area is too large to cover with a washcloth. Harsh chemicals, such as alkalis or acids, can cause skin damage that is like a burn. You should flush your skin for 15 to 20 minutes with cold water after such an exposure. You should also seek immediate medical care after exposure. Bandages (dressings), antibiotics, and pain medicine may be needed for severely irritated skin.  HOME CARE INSTRUCTIONS  Avoid the substance that caused your reaction.  Keep the area of skin that is affected away from hot water, soap, sunlight, chemicals, acidic substances, or anything else that would irritate your skin.  Do not scratch the rash. Scratching may cause the rash to become infected.  You may take cool baths to help stop the itching.  Only take over-the-counter or prescription medicines as directed by your caregiver.  See your caregiver for follow-up care as directed to make sure your skin is healing properly. SEEK MEDICAL CARE IF:   Your condition is not better after 3 days of treatment.  You seem to be getting worse.  You see signs of infection such as swelling, tenderness, redness, soreness, or warmth in the affected area.  You have any problems related to your medicines. Document Released: 08/08/2000 Document Revised: 11/03/2011 Document Reviewed: 01/14/2011 [facility] Patient Information 2014 [place], [place].      I personally performed the services described in this documentation, which was scribed in my presence. The recorded information has been reviewed and considered, and addended by me as  needed.

## 2013-12-06 ENCOUNTER — Ambulatory Visit (INDEPENDENT_AMBULATORY_CARE_PROVIDER_SITE_OTHER): Payer: Medicare FFS | Admitting: Family Medicine

## 2013-12-06 VITALS — BP 140/80 | HR 70 | Temp 98.0°F | Resp 16 | Ht 65.0 in | Wt 129.0 lb

## 2013-12-06 DIAGNOSIS — R350 Frequency of micturition: Secondary | ICD-10-CM

## 2013-12-06 DIAGNOSIS — N390 Urinary tract infection, site not specified: Secondary | ICD-10-CM

## 2013-12-06 LAB — POCT UA - MICROSCOPIC ONLY
Casts, Ur, LPF, POC: NEGATIVE
Crystals, Ur, HPF, POC: NEGATIVE
MUCUS UA: NEGATIVE
Yeast, UA: NEGATIVE

## 2013-12-06 LAB — POCT URINALYSIS DIPSTICK
BILIRUBIN UA: NEGATIVE
Glucose, UA: NEGATIVE
KETONES UA: NEGATIVE
Nitrite, UA: NEGATIVE
PH UA: 6.5
Protein, UA: NEGATIVE
Spec Grav, UA: 1.02
Urobilinogen, UA: 0.2

## 2013-12-06 MED ORDER — CIPROFLOXACIN HCL 250 MG PO TABS: 250.0000 mg | ORAL_TABLET | Freq: Two times a day (BID) | ORAL | Status: DC

## 2013-12-06 NOTE — Progress Notes (Signed)
Subjective:     Patient ID: [name], female   DOB: 04/28/1956, 58 y.o.   MRN: [id number]  Urinary Frequency  Associated symptoms include frequency and urgency. Pertinent negatives include no chills, flank pain or hematuria.   58 year complaining increased urinary frequency/urgency x 1 day.  Denies vaginal discharge or hematuria.  Denies new sexual contact.  Denies lower back pain and fevers.   Review of Systems  Constitutional: Negative for fever and chills.  Respiratory: Negative for chest tightness and shortness of breath.   Cardiovascular: Negative for chest pain.  Gastrointestinal: Negative for abdominal pain, diarrhea, constipation and abdominal distention.  Genitourinary: Positive for urgency and frequency. Negative for hematuria, flank pain, vaginal discharge, difficulty urinating and pelvic pain.  Skin: Negative for rash.  Neurological: Negative for syncope and headaches.       Objective:   Physical Exam  Constitutional: She is oriented to person, place, and time. She appears well-developed and well-nourished.  HENT:  Head: Normocephalic and atraumatic.  Cardiovascular: Normal rate and regular rhythm.  Exam reveals no gallop and no friction rub.   No murmur heard. Pulmonary/Chest: Effort normal and breath sounds normal.  Abdominal: Soft. Bowel sounds are normal. She exhibits no distension and no mass. There is no tenderness. There is no rebound.  Neurological: She is alert and oriented to person, place, and time.   Filed Vitals:   12/06/13 2034  BP: 140/80  Pulse: 70  Temp: 98 F (36.7 C)  Resp: 16        Results for orders placed in visit on 12/06/13  POCT UA - MICROSCOPIC ONLY      Result Value Ref Range   WBC, Ur, HPF, POC 3-12     RBC, urine, microscopic 0-2     Bacteria, U Microscopic 1+     Mucus, UA negative     Epithelial cells, urine per micros 2-5     Crystals, Ur, HPF, POC negative     Casts, Ur, LPF, POC negative     Yeast, UA negative     POCT URINALYSIS DIPSTICK      Result Value Ref Range   Color, UA yellow     Clarity, UA cloudy     Glucose, UA negative     Bilirubin, UA negative     Ketones, UA negative     Spec Grav, UA 1.020     Blood, UA trace-lysed     pH, UA 6.5     Protein, UA negative     Urobilinogen, UA 0.2     Nitrite, UA negative     Leukocytes, UA moderate (2+)      Assessment:    1. Urinary tract infection - uncomplicated  - positive ua will send for culture     Plan:     1. Ciprofloxacin 250 bid x 7 days  -

## 2013-12-07 NOTE — Progress Notes (Signed)
History and physical examinations obtained with Ilean Skill, Medical Student.  Labs reviewed. Agree with A/P.

## 2013-12-09 LAB — URINE CULTURE

## 2014-01-20 ENCOUNTER — Ambulatory Visit (INDEPENDENT_AMBULATORY_CARE_PROVIDER_SITE_OTHER): Payer: Managed Care, Other (non HMO) | Admitting: Internal Medicine

## 2014-01-20 ENCOUNTER — Encounter: Payer: Self-pay | Admitting: Internal Medicine

## 2014-01-20 VITALS — BP 120/70 | HR 76 | Temp 98.9°F | Resp 20 | Ht 65.0 in | Wt 131.0 lb

## 2014-01-20 DIAGNOSIS — N390 Urinary tract infection, site not specified: Secondary | ICD-10-CM

## 2014-01-20 DIAGNOSIS — R350 Frequency of micturition: Secondary | ICD-10-CM

## 2014-01-20 DIAGNOSIS — R1084 Generalized abdominal pain: Secondary | ICD-10-CM

## 2014-01-20 LAB — POCT URINALYSIS DIPSTICK
Bilirubin, UA: NEGATIVE
Glucose, UA: NEGATIVE
Ketones, UA: NEGATIVE
Nitrite, UA: POSITIVE
RBC UA: 1
SPEC GRAV UA: 1.025
UROBILINOGEN UA: 0.2
pH, UA: 5.5

## 2014-01-20 MED ORDER — NITROFURANTOIN MACROCRYSTAL 100 MG PO CAPS: 100.0000 mg | ORAL_CAPSULE | Freq: Four times a day (QID) | ORAL | Status: DC

## 2014-01-20 NOTE — Progress Notes (Signed)
Subjective:    Patient ID: [name], female    DOB: 1956-01-14, 58 y.o.   MRN: [id number]  HPI  58 year old patient, who presents with a chief complaint of nausea, dizziness, urinary frequency and urgency, but no dysuria.  She is seen at the urgent care one month ago and treated for a UTI with 7 days of Cipro.  Symptoms seemed to improve, but did not totally resolve. She complains of worsening fatigue.  Past Medical History  Diagnosis Date  . Colitis   . Anxiety   . Migraine   . Depression   . Palpitations     History   Social History  . Marital Status: Married    Spouse Name: N/A    Number of Children: 3  . Years of Education: N/A   Occupational History  . insurance    Social History Main Topics  . Smoking status: Current Every Day Smoker -- 1.00 packs/day    Types: Cigarettes  . Smokeless tobacco: Never Used  . Alcohol Use: Yes  . Drug Use: No  . Sexual Activity: Not on file   Other Topics Concern  . Not on file   Social History Narrative   Divorced   3 children          Past Surgical History  Procedure Laterality Date  . Tonsillectomy      Family History  Problem Relation Age of Onset  . Prostate cancer Father   . Hypertension Mother     No Known Allergies  Current Outpatient Prescriptions on File Prior to Visit  Medication Sig Dispense Refill  . triamcinolone cream (KENALOG) 0.1 % Apply 1 application topically 2 (two) times daily as needed.  30 g  1   No current facility-administered medications on file prior to visit.    BP 120/70  Pulse 76  Temp(Src) 98.9 F (37.2 C) (Oral)  Resp 20  Ht 5' 5"  (1.651 m)  Wt 131 lb (59.421 kg)  BMI 21.80 kg/m2  SpO2 98%       Review of Systems  Constitutional: Negative.   HENT: Negative for congestion, dental problem, hearing loss, rhinorrhea, sinus pressure, sore throat and tinnitus.   Eyes: Negative for pain, discharge and visual disturbance.  Respiratory: Negative for cough and  shortness of breath.   Cardiovascular: Negative for chest pain, palpitations and leg swelling.  Gastrointestinal: Negative for nausea, vomiting, abdominal pain, diarrhea, constipation, blood in stool and abdominal distention.  Genitourinary: Positive for urgency and frequency. Negative for dysuria, hematuria, flank pain, vaginal bleeding, vaginal discharge, difficulty urinating, vaginal pain and pelvic pain.  Musculoskeletal: Negative for arthralgias, gait problem and joint swelling.  Skin: Negative for rash.  Neurological: Negative for dizziness, syncope, speech difficulty, weakness, numbness and headaches.  Hematological: Negative for adenopathy.  Psychiatric/Behavioral: Negative for behavioral problems, dysphoric mood and agitation. The patient is not nervous/anxious.        Objective:   Physical Exam  Constitutional: She is oriented to person, place, and time. She appears well-developed and well-nourished.  HENT:  Head: Normocephalic.  Right Ear: External ear normal.  Left Ear: External ear normal.  Mouth/Throat: Oropharynx is clear and moist.  Cerumen right canal  Eyes: Conjunctivae and EOM are normal. Pupils are equal, round, and reactive to light.  Neck: Normal range of motion. Neck supple. No thyromegaly present.  Cardiovascular: Normal rate, regular rhythm, normal heart sounds and intact distal pulses.   Pulmonary/Chest: Effort normal and breath sounds normal.  Abdominal: Soft. Bowel  sounds are normal. She exhibits no mass. There is no tenderness.  Musculoskeletal: Normal range of motion.  Lymphadenopathy:    She has no cervical adenopathy.  Neurological: She is alert and oriented to person, place, and time.  Skin: Skin is warm and dry. No rash noted.  Psychiatric: She has a normal mood and affect. Her behavior is normal.          Assessment & Plan:   Recurrent UTI.  We'll check another urine culture and treat with Macrodantin for 10 days

## 2014-01-20 NOTE — Progress Notes (Signed)
Pre-visit discussion using our clinic review tool. No additional management support is needed unless otherwise documented below in the visit note.  

## 2014-01-20 NOTE — Patient Instructions (Addendum)
Take your antibiotic as prescribed until ALL of it is gone, but stop if you develop a rash, swelling, or any side effects of the medication.  Contact our office as soon as possible if  there are side effects of the medication.Overactive Bladder, Adult The bladder has two functions that are totally opposite of the other. One is to relax and stretch out so it can store urine (fills like a balloon), and the other is to contract and squeeze down so that it can empty the urine that it has stored. Proper functioning of the bladder is a complex mixing of these two functions. The filling and emptying of the bladder can be influenced by:  The bladder.  The spinal cord.  The brain.  The nerves going to the bladder.  Other organs that are closely related to the bladder such as prostate in males and the vagina in females. As your bladder fills with urine, nerve signals are sent from the bladder to the brain to tell you that you may need to urinate. Normal urination requires that the bladder squeeze down with sufficient strength to empty the bladder, but this also requires that the bladder squeeze down sufficiently long to finish the job. In addition the sphincter muscles, which normally keep you from leaking urine, must also relax so that the urine can pass. Coordination between the bladder muscle squeezing down and the sphincter muscles relaxing is required to make everything happen normally. With an overactive bladder sometimes the muscles of the bladder contract unexpectedly and involuntarily and this causes an urgent need to urinate. The normal response is to try to hold urine in by contracting the sphincter muscles. Sometimes the bladder contracts so strongly that the sphincter muscles cannot stop the urine from passing out and incontinence occurs. This kind of incontinence is called urge incontinence. Having an overactive bladder can be embarrassing and awkward. It can keep you from living life the way you  want to. Many people think it is just something you have to put up with as you grow older or have certain health conditions. In fact, there are treatments that can help make your life easier and more pleasant. CAUSES  Many things can cause an overactive bladder. Possibilities include:  Urinary tract infection or infection of nearby tissues such as the prostate.  Prostate enlargement.  In women, multiple pregnancies or surgery on the uterus or urethra.  Bladder stones, inflammation or tumors.  Caffeine.  Alcohol.  Medications. For example, diuretics (drugs that help the body get rid of extra fluid) increase urine production. Some other medicines must be taken with lots of fluids.  Muscle or nerve weakness. This might be the result of a spinal cord injury, a stroke, multiple sclerosis or Parkinson's disease.  Diabetes can cause a high urine volume which fills the bladder so quickly that the normal urge to urinate is triggered very strongly. SYMPTOMS   Loss of bladder control. You feel the need to urinate and cannot make your body wait.  Sudden, strong urges to urinate.  Urinating 8 or more times a day.  Waking up to urinate two or more times a night. DIAGNOSIS  To decide if you have overactive bladder, your healthcare provider will probably:  Ask about symptoms you have noticed.  Ask about your overall health. This will include questions about any medications you are taking.  Do a physical examination. This will help determine if there are obvious blockages or other problems.  Order some tests. These might  include:  A blood test to check for diabetes or other health issues that could be contributing to the problem.  Urine testing. This could measure the flow of urine and the pressure on the bladder.  A test of your neurological system (the brain, spinal cord and nerves). This is the system that senses the need to urinate. Some of these tests are called flow tests, bladder  pressure tests and electrical measurements of the sphincter muscle.  A bladder test to check whether it is emptying completely when you urinate.  Cytoscopy. This test uses a thin tube with a tiny camera on it. It offers a look inside your urethra and bladder to see if there are problems.  Imaging tests. You might be given a contrast dye and then asked to urinate. X-rays are taken to see how your bladder is working. TREATMENT  An overactive bladder can be treated in many ways. The treatment will depend on the cause. Whether you have a mild or severe case also makes a difference. Often, treatment can be given in your healthcare provider's office or clinic. Be sure to discuss the different options with your caregiver. They include:  Behavioral treatments. These do not involve medication or surgery:  Bladder training. For this, you would follow a schedule to urinate at regular intervals. This helps you learn to control the urge to urinate. At first, you might be asked to wait a few minutes after feeling the urge. In time, you should be able to schedule bathroom visits an hour or more apart.  Kegel exercises. These exercises strengthen the pelvic floor muscles, which support the bladder. By toning these muscles, they can help control urination, even if the bladder muscles are overactive. A specialist will teach you how to do these exercises correctly. They will require daily practice.  Weight loss. If you are obese or overweight, losing weight might stop your bladder from being overactive. Talk to your healthcare provider about how many pounds you should lose. Also ask if there is a specific program or method that would work best for you.  Diet change. This might be suggested if constipation is making your overactive bladder worse. Your healthcare provider or a nutritionist can explain ways to change what you eat to ease constipation. Other people might need to take in less caffeine or alcohol.  Sometimes drinking fewer fluids is needed, too.  Protection. This is not an actual treatment. But, you could wear special pads to take care of any leakage while you wait for other treatments to take effect. This will help you avoid embarrassment.  Physical treatments.  Electrical stimulation. Electrodes will send gentle pulses to the nerves or muscles that help control the bladder. The goal is to strengthen them. Sometimes this is done with the electrodes outside of the body. Or, they might be placed inside the body (implanted). This treatment can take several months to have an effect.  Medications. These are usually used along with other treatments. Several medicines are available. Some are injected into the muscles involved in urination. Others come in pill form. Medications sometimes prescribed include:  Anticholinergics. These drugs block the signals that the nerves deliver to the bladder. This keeps it from releasing urine at the wrong time. Researchers think the drugs might help in other ways, too.  Imipramine. This is an antidepressant. But, it relaxes bladder muscles.  Botox. This is still experimental. Some people believe that injecting it into the bladder muscles will relax them so they work more  normally. It has also been injected into the sphincter muscle when the sphincter muscle does not open properly. This is a temporary fix, however. Also, it might make matters worse, especially in older people.  Surgery.  A device might be implanted to help manage your nerves. It works on the nerves that signal when you need to urinate.  Surgery is sometimes needed with electrical stimulation. If the electrodes are implanted, this is done through surgery.  Sometimes repairs need to be made through surgery. For example, the size of the bladder can be changed. This is usually done in severe cases only. HOME CARE INSTRUCTIONS   Take any medications your healthcare provider prescribed or  suggested. Follow the directions carefully.  Practice any lifestyle changes that are recommended. These might include:  Drinking less fluid or drinking at different times of the day. If you need to urinate often during the night, for example, you may need to stop drinking fluids early in the evening.  Cutting down on caffeine or alcohol. They can both make an overactive bladder worse. Caffeine is found in coffee, tea and sodas.  Doing Kegel exercises to strengthen muscles.  Losing weight, if that is recommended.  Eating a healthy and balanced diet. This will help you avoid constipation.  Keep a journal or a log. You might be asked to record how much you drink and when, and also when you feel the need to urinate.  Learn how to care for implants or other devices, such as pessaries. SEEK MEDICAL CARE IF:   Your overactive bladder gets worse.  You feel increased pain or irritation when you urinate.  You notice blood in your urine.  You have questions about any medications or devices that your healthcare provider recommended.  You notice blood, pus or swelling at the site of any test or treatment procedure.  You have an oral temperature above 102 F (38.9 C). SEEK IMMEDIATE MEDICAL CARE IF:  You have an oral temperature above 102 F (38.9 C), not controlled by medicine. Document Released: 06/07/2009 Document Revised: 11/03/2011 Document Reviewed: 06/07/2009 [facility] Patient Information 2014 [place].

## 2014-01-23 LAB — URINE CULTURE: Colony Count: >=100000

## 2014-10-24 ENCOUNTER — Encounter: Payer: Self-pay | Admitting: Internal Medicine

## 2014-10-24 ENCOUNTER — Ambulatory Visit (INDEPENDENT_AMBULATORY_CARE_PROVIDER_SITE_OTHER): Payer: Managed Care, Other (non HMO) | Admitting: Internal Medicine

## 2014-10-24 VITALS — BP 130/90 | HR 95 | Temp 98.0°F | Resp 29 | Ht 65.0 in | Wt 132.0 lb

## 2014-10-24 DIAGNOSIS — F329 Major depressive disorder, single episode, unspecified: Secondary | ICD-10-CM

## 2014-10-24 DIAGNOSIS — F411 Generalized anxiety disorder: Secondary | ICD-10-CM

## 2014-10-24 DIAGNOSIS — Z0000 Encounter for general adult medical examination without abnormal findings: Secondary | ICD-10-CM

## 2014-10-24 DIAGNOSIS — F32A Depression, unspecified: Secondary | ICD-10-CM

## 2014-10-24 DIAGNOSIS — M25562 Pain in left knee: Secondary | ICD-10-CM

## 2014-10-24 LAB — CBC WITH DIFFERENTIAL/PLATELET
BASOS PCT: 0.5 % (ref 0.0–3.0)
Basophils Absolute: 0 10*3/uL (ref 0.0–0.1)
EOS PCT: 0.6 % (ref 0.0–5.0)
Eosinophils Absolute: 0 10*3/uL (ref 0.0–0.7)
HEMATOCRIT: 38.8 % (ref 36.0–46.0)
Hemoglobin: 13.2 g/dL (ref 12.0–15.0)
Lymphocytes Relative: 27.8 % (ref 12.0–46.0)
Lymphs Abs: 1.7 10*3/uL (ref 0.7–4.0)
MCHC: 34.1 g/dL (ref 30.0–36.0)
MCV: 88.5 fl (ref 78.0–100.0)
Monocytes Absolute: 0.5 10*3/uL (ref 0.1–1.0)
Monocytes Relative: 8.2 % (ref 3.0–12.0)
NEUTROS PCT: 62.9 % (ref 43.0–77.0)
Neutro Abs: 3.9 10*3/uL (ref 1.4–7.7)
PLATELETS: 190 10*3/uL (ref 150.0–400.0)
RBC: 4.39 Mil/uL (ref 3.87–5.11)
RDW: 13.4 % (ref 11.5–15.5)
WBC: 6.2 10*3/uL (ref 4.0–10.5)

## 2014-10-24 LAB — COMPREHENSIVE METABOLIC PANEL
ALBUMIN: 4.3 g/dL (ref 3.5–5.2)
ALT: 12 U/L (ref 0–35)
AST: 12 U/L (ref 0–37)
Alkaline Phosphatase: 57 U/L (ref 39–117)
BUN: 15 mg/dL (ref 6–23)
CALCIUM: 9.2 mg/dL (ref 8.4–10.5)
CO2: 32 meq/L (ref 19–32)
Chloride: 106 mEq/L (ref 96–112)
Creatinine, Ser: 0.72 mg/dL (ref 0.40–1.20)
GFR: 88.37 mL/min (ref >60.00)
Glucose, Bld: 79 mg/dL (ref 70–99)
POTASSIUM: 3.9 meq/L (ref 3.5–5.1)
Sodium: 141 mEq/L (ref 135–145)
Total Bilirubin: 0.6 mg/dL (ref 0.2–1.2)
Total Protein: 6.8 g/dL (ref 6.0–8.3)

## 2014-10-24 LAB — LIPID PANEL
CHOL/HDL RATIO: 4
Cholesterol: 212 mg/dL — ABNORMAL HIGH (ref 0–200)
HDL: 54.3 mg/dL (ref >39.00)
LDL Cholesterol: 131 mg/dL — ABNORMAL HIGH (ref 0–99)
NonHDL: 157.7
Triglycerides: 133 mg/dL (ref 0.0–149.0)
VLDL: 26.6 mg/dL (ref 0.0–40.0)

## 2014-10-24 LAB — SEDIMENTATION RATE: Sed Rate: 16 mm/hr (ref 0–22)

## 2014-10-24 LAB — TSH: TSH: 1.22 u[IU]/mL (ref 0.35–4.50)

## 2014-10-24 MED ORDER — BUPROPION HCL ER (XL) 150 MG PO TB24: 150.0000 mg | ORAL_TABLET | Freq: Every day | ORAL | Status: DC

## 2014-10-24 NOTE — Patient Instructions (Signed)

## 2014-10-24 NOTE — Progress Notes (Signed)
Subjective:    Patient ID: [name], female    DOB: [birth year], 59 y.o.   MRN: [id number]  HPI 59 year old patient who has a prior history depression.  For months.  She has had chronic fatigue and a general sense of unwellness.  She also describes vague discomfort in the left arm and left leg that has also been present for months.  There is been no swelling, redness or local tenderness She has been quite excited about the arrival of a grandchild soon and has no particular stressors.  She has responded well to SSRI therapy in the past and has always relapsed off therapy.  Although she responds well to therapy, she has bothersome.  Ill-defined side effects  Past Medical History  Diagnosis Date  . Colitis   . Anxiety   . Migraine   . Depression   . Palpitations     History   Social History  . Marital Status: Married    Spouse Name: N/A  . Number of Children: 3  . Years of Education: N/A   Occupational History  . insurance    Social History Main Topics  . Smoking status: Current Every Day Smoker -- 1.00 packs/day    Types: Cigarettes  . Smokeless tobacco: Never Used  . Alcohol Use: Yes  . Drug Use: No  . Sexual Activity: Not on file   Other Topics Concern  . Not on file   Social History Narrative   Divorced   3 children          Past Surgical History  Procedure Laterality Date  . Tonsillectomy      Family History  Problem Relation Age of Onset  . Prostate cancer Father   . Hypertension Mother     No Known Allergies  No current outpatient prescriptions on file prior to visit.   No current facility-administered medications on file prior to visit.    BP 130/90 mmHg  Pulse 95  Temp(Src) 98 F (36.7 C) (Oral)  Resp 29  Ht 5' 5"  (1.651 m)  Wt 132 lb (59.875 kg)  BMI 21.97 kg/m2  SpO2 97%       Review of Systems  Constitutional: Positive for fatigue.  HENT: Negative for congestion, dental problem, hearing loss, rhinorrhea, sinus  pressure, sore throat and tinnitus.   Eyes: Negative for pain, discharge and visual disturbance.  Respiratory: Negative for cough and shortness of breath.   Cardiovascular: Negative for chest pain, palpitations and leg swelling.  Gastrointestinal: Negative for nausea, vomiting, abdominal pain, diarrhea, constipation, blood in stool and abdominal distention.  Genitourinary: Negative for dysuria, urgency, frequency, hematuria, flank pain, vaginal bleeding, vaginal discharge, difficulty urinating, vaginal pain and pelvic pain.  Musculoskeletal: Negative for joint swelling, arthralgias and gait problem.  Skin: Negative for rash.  Neurological: Negative for dizziness, syncope, speech difficulty, weakness, numbness and headaches.  Hematological: Negative for adenopathy.  Psychiatric/Behavioral: Positive for dysphoric mood. Negative for behavioral problems and agitation. The patient is not nervous/anxious.        Objective:   Physical Exam  Constitutional: She is oriented to person, place, and time. She appears well-developed and well-nourished.  HENT:  Head: Normocephalic.  Right Ear: External ear normal.  Left Ear: External ear normal.  Mouth/Throat: Oropharynx is clear and moist.  Eyes: Conjunctivae and EOM are normal. Pupils are equal, round, and reactive to light.  Neck: Normal range of motion. Neck supple. No thyromegaly present.  Cardiovascular: Normal rate, regular rhythm, normal heart sounds and  intact distal pulses.   Pulmonary/Chest: Effort normal and breath sounds normal.  Abdominal: Soft. Bowel sounds are normal. She exhibits no mass. There is no tenderness.  Musculoskeletal: Normal range of motion.  Lymphadenopathy:    She has no cervical adenopathy.  Neurological: She is alert and oriented to person, place, and time.  Skin: Skin is warm and dry. No rash noted.  Psychiatric: She has a normal mood and affect. Her behavior is normal.          Assessment & Plan:   Suspect  clinical depression.  Will give a trial of Wellbutrin.  We'll check screening lab Recheck 6 weeks

## 2014-10-24 NOTE — Progress Notes (Signed)
Pre visit review using our clinic review tool, if applicable. No additional management support is needed unless otherwise documented below in the visit note. 

## 2014-10-26 ENCOUNTER — Telehealth: Payer: Self-pay | Admitting: Internal Medicine

## 2014-10-26 NOTE — Telephone Encounter (Signed)
Pt would like blood work results °

## 2014-10-27 NOTE — Telephone Encounter (Signed)
Please call/notify patient that lab/test/procedure is normal  Cholesterol is 212

## 2014-10-27 NOTE — Telephone Encounter (Signed)
Spoke to pt, told her labs were normal except Cholesterol and LDL elevated, Chol: 212, LDL: 131. Pt verbalized understanding. Told pt will mail info about cholesterol to her. Pt verbalized understanding. Info mailed.

## 2014-10-27 NOTE — Telephone Encounter (Signed)
Pt is calling back req lab results

## 2014-10-27 NOTE — Telephone Encounter (Signed)
Pt calling for lab results from 3/1. Please advise.

## 2014-10-27 NOTE — Telephone Encounter (Signed)
Left message on voicemail to call office.  

## 2014-11-16 ENCOUNTER — Other Ambulatory Visit: Payer: Self-pay | Admitting: *Deleted

## 2014-11-16 MED ORDER — BUPROPION HCL ER (XL) 150 MG PO TB24: 150.0000 mg | ORAL_TABLET | Freq: Every day | ORAL | Status: DC

## 2015-01-24 ENCOUNTER — Telehealth: Payer: Self-pay

## 2015-01-24 DIAGNOSIS — Z1231 Encounter for screening mammogram for malignant neoplasm of breast: Secondary | ICD-10-CM

## 2015-01-24 NOTE — Telephone Encounter (Signed)
Spoke with pt., ordered mammogram.

## 2015-02-19 ENCOUNTER — Ambulatory Visit
Admission: RE | Admit: 2015-02-19 | Discharge: 2015-02-19 | Disposition: A | Discharge status: Home / Self Care | Payer: Managed Care, Other (non HMO) | Source: Ambulatory Visit | Attending: Internal Medicine | Admitting: Internal Medicine

## 2015-02-19 DIAGNOSIS — Z1231 Encounter for screening mammogram for malignant neoplasm of breast: Secondary | ICD-10-CM

## 2015-04-14 ENCOUNTER — Ambulatory Visit (INDEPENDENT_AMBULATORY_CARE_PROVIDER_SITE_OTHER): Payer: Managed Care, Other (non HMO) | Admitting: Physician Assistant

## 2015-04-14 VITALS — BP 124/80 | HR 72 | Temp 98.4°F | Resp 18 | Ht 66.0 in | Wt 131.0 lb

## 2015-04-14 DIAGNOSIS — R059 Cough, unspecified: Secondary | ICD-10-CM

## 2015-04-14 DIAGNOSIS — Z720 Tobacco use: Secondary | ICD-10-CM | POA: Diagnosis not present

## 2015-04-14 DIAGNOSIS — F17200 Nicotine dependence, unspecified, uncomplicated: Secondary | ICD-10-CM

## 2015-04-14 DIAGNOSIS — R05 Cough: Secondary | ICD-10-CM | POA: Diagnosis not present

## 2015-04-14 MED ORDER — MUCINEX DM MAXIMUM STRENGTH 60-1200 MG PO TB12: 1.0000 | ORAL_TABLET | Freq: Two times a day (BID) | ORAL | Status: DC

## 2015-04-14 MED ORDER — AMOXICILLIN 875 MG PO TABS: 1750.0000 mg | ORAL_TABLET | Freq: Two times a day (BID) | ORAL | Status: AC

## 2015-04-14 NOTE — Patient Instructions (Signed)
Did you know that you begin to benefit from quitting smoking within the first twenty minutes?  It's TRUE.  At 20 minutes: -blood pressure decreases -pulse rate drops -body temperature of hands and feet increases  At 8 hours: -carbon monoxide level in blood drops to normal -oxygen level in blood increases to normal  At 24 hours: -the chance of heart attack decreases  At 48 hours: -nerve endings start regrowing -ability to smell and taste is enhanced  2 weeks-3 months: -circulation improves -walking becomes easier -lung function improves  1-9 months: -coughing, sinus congestion, fatigue and shortness of breath decreases  1 year: -excess risk of heart disease is decreased to HALF that of a smoker  5 years: Stroke risk is reduced to that of people who have never smoked  10 years: -risk of lung cancer drops to as little as half that of continuing smokers -risk of cancer of the mouth, throat, esophagus, bladder, kidney and pancreas decreases -risk of ulcer decreases  15 years -risk of heart disease is now similar to that of people who have never smoked -risk of death returns to nearly the level of people who have never smoked

## 2015-04-14 NOTE — Progress Notes (Signed)
Patient ID: Edrick Oh, female    DOB: 02/20/1956, 59 y.o.   MRN: [id number]  PCP: Nyoka Cowden, MD  Subjective:   Chief Complaint  Patient presents with  . Sore Throat    x 1 month  . Cough    x 1 month  . Ear Pain    x 1 month    HPI Presents for evaluation of illness x 1 month.  Initially had symptoms x 2 weeks. Improved with OTC medications and supportive care. Felt pretty good for about 1 week, then symptoms recurred one week ago.  Ear and throat pain are worse than initially. Cough is non-productive, and feels all "in here" and points to the throat.  Subjective fever. No GI symptoms. No urinary symptoms. A little bit headache. No dizziness. No visual changes. No sinus or nasal symptoms. No SOB or CP.   Review of Systems     Patient Active Problem List   Diagnosis Date Noted  . Smoker 04/14/2015  . Tingling of skin 10/10/2011  . Dry skin 10/10/2011  . Itching 10/10/2011  . Insomnia 10/10/2011  . Imbalance 10/10/2011  . Stress 10/10/2011  . Colitis 02/17/2011  . Depression 10/29/2009  . PALPITATIONS, HX OF 10/29/2009  . Anxiety state 04/11/2008  . ABDOMINAL PAIN, GENERALIZED 08/30/2007  . PNEUMONIA 07/03/2007     Prior to Admission medications   Medication Sig Start Date End Date Taking? Authorizing Provider  Multiple Vitamin (MULTIVITAMIN) tablet Take 1 tablet by mouth daily.   Yes Historical Provider, MD  buPROPion (WELLBUTRIN XL) 150 MG 24 hr tablet Take 1 tablet (150 mg total) by mouth daily. Patient not taking: Reported on 04/14/2015 11/16/14   Marletta Lor, MD     No Known Allergies     Objective:  Physical Exam  Constitutional: She is oriented to person, place, and time. She appears well-developed and well-nourished. She is active and cooperative. No distress.  BP 124/80 mmHg  Pulse 72  Temp(Src) 98.4 F (36.9 C) (Oral)  Resp 18  Ht 5' 6"  (1.676 m)  Wt 131 lb (59.421 kg)  BMI 21.15 kg/m2  SpO2 99%   HENT:    Head: Normocephalic and atraumatic.  Right Ear: Hearing, tympanic membrane, external ear and ear canal normal.  Left Ear: Hearing, tympanic membrane, external ear and ear canal normal.  Nose: Mucosal edema (mild) present. No rhinorrhea, sinus tenderness or nasal deformity. No epistaxis.  Mouth/Throat: Uvula is midline, oropharynx is clear and moist and mucous membranes are normal. Normal dentition.  Eyes: Conjunctivae are normal. No scleral icterus.  Neck: Normal range of motion, full passive range of motion without pain and phonation normal. Neck supple. No tracheal tenderness present. No thyromegaly present.  Cardiovascular: Normal rate, regular rhythm, normal heart sounds and intact distal pulses.   Pulmonary/Chest: Effort normal and breath sounds normal.  Lymphadenopathy:       Head (right side): No submental, no submandibular, no tonsillar, no preauricular and no posterior auricular adenopathy present.       Head (left side): No submental, no submandibular, no tonsillar, no preauricular and no posterior auricular adenopathy present.    She has no cervical adenopathy.       Right: No supraclavicular adenopathy present.       Left: No supraclavicular adenopathy present.  LEFT neck tenderness on palpation, but no palpable nodes.  Neurological: She is alert and oriented to person, place, and time.  Skin: Skin is warm and dry.  Psychiatric: She has  a normal mood and affect. Her speech is normal and behavior is normal.           Assessment & Plan:   1. Cough, sore throat Suspect subacute sinusitis causing PND resulting in cough and sore throat radiating to the LEFT ear. - amoxicillin (AMOXIL) 875 MG tablet; Take 2 tablets (1,750 mg total) by mouth 2 (two) times daily.  Dispense: 20 tablet; Refill: 0 - Dextromethorphan-Guaifenesin (MUCINEX DM MAXIMUM STRENGTH) 60-1200 MG TB12; Take 1 tablet by mouth every 12 (twelve) hours.  Dispense: 20 each; Refill: 0  2. Smoker Encouraged smoking  cessation. Consider starting Wellbutrin (prescribed by her PCP for depression).   Fara Chute, PA-C Physician Assistant-Certified [facility]

## 2015-05-04 ENCOUNTER — Other Ambulatory Visit: Payer: Self-pay | Admitting: Internal Medicine

## 2015-07-27 ENCOUNTER — Ambulatory Visit (INDEPENDENT_AMBULATORY_CARE_PROVIDER_SITE_OTHER): Payer: Managed Care, Other (non HMO) | Admitting: Family Medicine

## 2015-07-27 VITALS — BP 140/90 | HR 64 | Temp 98.1°F | Resp 16 | Ht 65.0 in | Wt 135.0 lb

## 2015-07-27 DIAGNOSIS — R52 Pain, unspecified: Secondary | ICD-10-CM | POA: Diagnosis not present

## 2015-07-27 DIAGNOSIS — J0110 Acute frontal sinusitis, unspecified: Secondary | ICD-10-CM | POA: Diagnosis not present

## 2015-07-27 DIAGNOSIS — R509 Fever, unspecified: Secondary | ICD-10-CM

## 2015-07-27 DIAGNOSIS — R5381 Other malaise: Secondary | ICD-10-CM

## 2015-07-27 DIAGNOSIS — J029 Acute pharyngitis, unspecified: Secondary | ICD-10-CM

## 2015-07-27 LAB — POCT INFLUENZA A/B
Influenza A, POC: NEGATIVE
Influenza B, POC: NEGATIVE

## 2015-07-27 MED ORDER — AMOXICILLIN 500 MG PO CAPS
1000.0000 mg | ORAL_CAPSULE | Freq: Two times a day (BID) | ORAL | Status: DC
Start: 2015-07-27 — End: 2016-04-11

## 2015-07-27 NOTE — Progress Notes (Signed)
[facility] [address] [place] [address] [phone]  Date:  07/27/2015   Name:  [name]   DOB:  09/17/1955   MRN:  [id number]  PCP:  Nyoka Cowden, MD    Chief Complaint: Sore Throat; Cough; Fever; and Flu Vaccine   History of Present Illness:  [name] is a 59 y.o. very pleasant female patient who presents with the following:  She is here today with illness- she has noted cough, nasal congestion for about one week, and a ST.  She is hoarse.  Fever one day.   She has noted some aches and chills. She has felt like she might have the flu She has used nyquil and tylenol.   Last dose of tylenol was last night.    She thinks that she got sick from her granddaughter last weekend.  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 Smoker 04/14/2015  . Tingling of skin 10/10/2011  . Dry skin 10/10/2011  . Itching 10/10/2011  . Insomnia 10/10/2011  . Imbalance 10/10/2011  . Stress 10/10/2011  . Colitis 02/17/2011  . Depression 10/29/2009  . PALPITATIONS, HX OF 10/29/2009  . Anxiety state 04/11/2008  . ABDOMINAL PAIN, GENERALIZED 08/30/2007  . PNEUMONIA 07/03/2007    Past Medical History  Diagnosis Date  . Colitis   . Anxiety   . Migraine   . Depression   . Palpitations     Past Surgical History  Procedure Laterality Date  . Tonsillectomy    . Appendectomy Right     Social History  Substance Use Topics  . Smoking status: Current Every Day Smoker -- 1.00 packs/day    Types: Cigarettes  . Smokeless tobacco: Never Used     Comment: quit date: 04/23/2015. ADE with Chantix  . Alcohol Use: 0.0 oz/week    0 Standard drinks or equivalent per week    Family History  Problem Relation Age of Onset  . Prostate cancer Father   . Hypertension Mother     No Known Allergies  Medication list has been reviewed and updated.  Current Outpatient Prescriptions on File Prior to  Visit  Medication Sig Dispense Refill  . buPROPion (WELLBUTRIN XL) 150 MG 24 hr tablet TAKE 1 TABLET (150 MG TOTAL) BY MOUTH DAILY. 90 tablet 1  . Multiple Vitamin (MULTIVITAMIN) tablet Take 1 tablet by mouth daily.     No current facility-administered medications on file prior to visit.    Review of Systems:  As per HPI- otherwise negative.  Physical Examination: Filed Vitals:   07/27/15 1018  BP: 140/90  Pulse: 64  Temp: 98.1 F (36.7 C)  Resp: 16   Filed Vitals:   07/27/15 1018  Height: 5' 5"  (1.651 m)  Weight: 135 lb (61.236 kg)   Body mass index is 22.47 kg/(m^2). Ideal Body Weight: Weight in (lb) to have BMI = 25: 149.9  GEN: WDWN, NAD, Non-toxic, A & O x 3, looks well HEENT: Atraumatic, Normocephalic. Neck supple. No masses, No LAD.  Bilateral TM wnl, oropharynx normal.  PEERL,EOMI.   Ears and Nose: No external deformity.  Nasal cavity inflammation CV: RRR, No M/G/R. No JVD. No thrill. No extra heart sounds. PULM: CTA B, no wheezes, crackles, rhonchi. No retractions. No resp. distress. No accessory muscle use. ABD: S, NT, ND, +BS. No rebound. No HSM. EXTR: No c/c/e NEURO Normal gait.  PSYCH: Normally interactive. Conversant.  Not depressed or anxious appearing.  Calm demeanor.   Results for orders placed or performed in visit on 07/27/15  POCT Influenza A/B  Result Value Ref Range   Influenza A, POC Negative Negative   Influenza B, POC Negative Negative    Assessment and Plan: Acute frontal sinusitis, recurrence not specified - Plan: amoxicillin (AMOXIL) 500 MG capsule  Sore throat - Plan: POCT Influenza A/B, amoxicillin (AMOXIL) 500 MG capsule  Fever, low grade - Plan: POCT Influenza A/B  Malaise - Plan: POCT Influenza A/B  Body aches - Plan: POCT Influenza A/B  Negative for flu Treat for sinusitis with amoxicillin Follow-up if not better soon  Signed Lamar Blinks, MD

## 2015-07-27 NOTE — Patient Instructions (Signed)
Your flu test is negative.  Since you have been ill for a week we are going to treat you for a sinus infection. I would recommend that you delay your flu shot by 1-2 weeks until you recover.  Let me know if you are not feeling better soon!

## 2015-11-29 ENCOUNTER — Other Ambulatory Visit: Payer: Self-pay | Admitting: Internal Medicine

## 2016-02-04 ENCOUNTER — Encounter: Payer: Self-pay | Admitting: Gastroenterology

## 2016-02-29 ENCOUNTER — Other Ambulatory Visit: Payer: Self-pay | Admitting: Internal Medicine

## 2016-04-11 ENCOUNTER — Encounter: Payer: Self-pay | Admitting: Family Medicine

## 2016-04-11 ENCOUNTER — Ambulatory Visit (INDEPENDENT_AMBULATORY_CARE_PROVIDER_SITE_OTHER): Payer: Managed Care, Other (non HMO) | Admitting: Family Medicine

## 2016-04-11 VITALS — BP 138/72 | HR 80 | Temp 97.8°F | Resp 18 | Ht 65.0 in | Wt 129.0 lb

## 2016-04-11 DIAGNOSIS — B085 Enteroviral vesicular pharyngitis: Secondary | ICD-10-CM | POA: Diagnosis not present

## 2016-04-11 MED ORDER — PENICILLIN V POTASSIUM 500 MG PO TABS: 500.0000 mg | ORAL_TABLET | Freq: Three times a day (TID) | ORAL | 0 refills | Status: DC

## 2016-04-11 NOTE — Patient Instructions (Signed)
     IF you received an x-ray today, you will receive an invoice from [facility]. Please contact [facility] at [phone] with questions or concerns regarding your invoice.   IF you received labwork today, you will receive an invoice from [facility]. Please contact [facility] at [phone] with questions or concerns regarding your invoice.   Our billing staff will not be able to assist you with questions regarding bills from these companies.  You will be contacted with the lab results as soon as they are available. The fastest way to get your results is to activate your My Chart account. Instructions are located on the last page of this paperwork. If you have not heard from us regarding the results in 2 weeks, please contact this office.      

## 2016-04-11 NOTE — Progress Notes (Signed)
60 yo woman with sore throat, fevers, swollen glands and ulcers in mouth for the past 5 days.  She was with her 22 month old granddaughter last weekend.  She now is experiences myalgias and fatigue.  No cough, chest pain, headache  Objective:  BP 138/72   Pulse 80   Temp 97.8 F (36.6 C) (Oral)   Resp 18   Ht 5' 5"  (1.651 m)   Wt 129 lb (58.5 kg)   SpO2 99%   BMI 21.47 kg/m  HEENT:  Normal TM's, multiple yellow eschar covered ulcers over hard palate.  Posterior pharynx reddened. Neck:  Supple, mild bilateral adenopathy, no thyromegaly  Assessment:  Aphthous ulcers.   Aphthous pharyngitis - Plan: penicillin v potassium (VEETID) 500 MG tablet  Robyn Haber, MD

## 2016-06-25 ENCOUNTER — Other Ambulatory Visit: Payer: Self-pay | Admitting: Internal Medicine

## 2016-10-23 ENCOUNTER — Telehealth: Payer: Self-pay | Admitting: Internal Medicine

## 2016-10-23 ENCOUNTER — Other Ambulatory Visit: Payer: Self-pay | Admitting: Internal Medicine

## 2016-10-23 MED ORDER — BUPROPION HCL ER (XL) 150 MG PO TB24: ORAL_TABLET | ORAL | 0 refills | Status: DC

## 2016-10-23 NOTE — Telephone Encounter (Signed)
Pharmacy called for pt to request a refill of buPROPion (WELLBUTRIN XL) 150 MG 24 hr tablet  Pt not seen since 10/2014. Advised pharmacy to let pt know, no refills until pt is seen.

## 2016-10-23 NOTE — Telephone Encounter (Signed)
Refilled medication only to last her until 10/28/16

## 2016-10-23 NOTE — Telephone Encounter (Signed)
Scheduled pt for appointment on 10/28/16 @ 12:45pm.

## 2016-10-28 ENCOUNTER — Encounter: Payer: Self-pay | Admitting: Internal Medicine

## 2016-10-28 ENCOUNTER — Ambulatory Visit (INDEPENDENT_AMBULATORY_CARE_PROVIDER_SITE_OTHER): Payer: 59 | Admitting: Internal Medicine

## 2016-10-28 VITALS — BP 118/70 | HR 73 | Temp 98.2°F | Ht 65.0 in | Wt 134.2 lb

## 2016-10-28 DIAGNOSIS — F17200 Nicotine dependence, unspecified, uncomplicated: Secondary | ICD-10-CM

## 2016-10-28 DIAGNOSIS — F321 Major depressive disorder, single episode, moderate: Secondary | ICD-10-CM

## 2016-10-28 MED ORDER — BUPROPION HCL ER (XL) 150 MG PO TB24
ORAL_TABLET | ORAL | 0 refills | Status: DC
Start: 2016-10-28 — End: 2017-09-28

## 2016-10-28 MED ORDER — ALPRAZOLAM 0.25 MG PO TABS: 0.2500 mg | ORAL_TABLET | Freq: Two times a day (BID) | ORAL | 0 refills | Status: DC | PRN

## 2016-10-28 NOTE — Patient Instructions (Signed)
It is important that you exercise regularly, at least 20 minutes 3 to 4 times per week.  If you develop chest pain or shortness of breath seek  medical attention.  Schedule your mammogram.  Return in 6 months for follow-up

## 2016-10-28 NOTE — Progress Notes (Signed)
Subjective:    Patient ID: Edrick Oh, female    DOB: [birth year], 61 y.o.   MRN: [id number]  HPI  61 year old patient who is seen today in follow-up.  She has not been seen in over 2 years. She does have a history of anxiety, depression and has done quite well on bupropion.  She has been using Xanax when necessary that she obtains from coworkers.  She does complain of situational stress, mainly work related. No new concerns or complaints  Past Medical History:  Diagnosis Date  . Anxiety   . Colitis   . Depression   . Migraine   . Palpitations      Social History   Social History  . Marital status: Married    Spouse name: N/A  . Number of children: 3  . Years of education: N/A   Occupational History  . insurance    Social History Main Topics  . Smoking status: Current Every Day Smoker    Packs/day: 1.00    Types: Cigarettes  . Smokeless tobacco: Never Used     Comment: quit date: 04/23/2015. ADE with Chantix  . Alcohol use 0.0 oz/week  . Drug use: No  . Sexual activity: Not on file   Other Topics Concern  . Not on file   Social History Narrative   Divorced   3 children          Past Surgical History:  Procedure Laterality Date  . APPENDECTOMY Right   . TONSILLECTOMY      Family History  Problem Relation Age of Onset  . Prostate cancer Father   . Hypertension Mother     No Known Allergies  Current Outpatient Prescriptions on File Prior to Visit  Medication Sig Dispense Refill  . buPROPion (WELLBUTRIN XL) 150 MG 24 hr tablet TAKE 1 TABLET (150 MG TOTAL) BY MOUTH DAILY. 6 tablet 0  . Multiple Vitamin (MULTIVITAMIN) tablet Take 1 tablet by mouth daily.    . penicillin v potassium (VEETID) 500 MG tablet Take 1 tablet (500 mg total) by mouth 3 (three) times daily. 21 tablet 0   No current facility-administered medications on file prior to visit.     BP 118/70 (BP Location: Left Arm, Patient Position: Sitting, Cuff Size: Normal)   Pulse 73    Temp 98.2 F (36.8 C) (Oral)   Ht 5' 5"  (1.651 m)   Wt 134 lb 3.2 oz (60.9 kg)   SpO2 98%   BMI 22.33 kg/m    Review of Systems  Constitutional: Negative.   HENT: Negative for congestion, dental problem, hearing loss, rhinorrhea, sinus pressure, sore throat and tinnitus.   Eyes: Negative for pain, discharge and visual disturbance.  Respiratory: Negative for cough and shortness of breath.   Cardiovascular: Negative for chest pain, palpitations and leg swelling.  Gastrointestinal: Negative for abdominal distention, abdominal pain, blood in stool, constipation, diarrhea, nausea and vomiting.  Genitourinary: Negative for difficulty urinating, dysuria, flank pain, frequency, hematuria, pelvic pain, urgency, vaginal bleeding, vaginal discharge and vaginal pain.  Musculoskeletal: Negative for arthralgias, gait problem and joint swelling.  Skin: Negative for rash.  Neurological: Negative for dizziness, syncope, speech difficulty, weakness, numbness and headaches.  Hematological: Negative for adenopathy.  Psychiatric/Behavioral: Positive for dysphoric mood. Negative for agitation and behavioral problems. The patient is nervous/anxious.        Objective:   Physical Exam  Constitutional: She is oriented to person, place, and time. She appears well-developed and well-nourished.  HENT:  Head:  Normocephalic.  Right Ear: External ear normal.  Left Ear: External ear normal.  Mouth/Throat: Oropharynx is clear and moist.  Eyes: Conjunctivae and EOM are normal. Pupils are equal, round, and reactive to light.  Neck: Normal range of motion. Neck supple. No thyromegaly present.  Cardiovascular: Normal rate, regular rhythm, normal heart sounds and intact distal pulses.   Pulmonary/Chest: Effort normal and breath sounds normal.  Abdominal: Soft. Bowel sounds are normal. She exhibits no mass. There is no tenderness.  Musculoskeletal: Normal range of motion.  Lymphadenopathy:    She has no cervical  adenopathy.  Neurological: She is alert and oriented to person, place, and time.  Skin: Skin is warm and dry. No rash noted.  Psychiatric: She has a normal mood and affect. Her behavior is normal.          Assessment & Plan:   Anxiety, depression.  We'll continue bupropion Anxiety disorder.  Prescription for Xanax, dispensed that she will use when necessary only  Follow-up 6 months Annual mammogram encouraged  Nyoka Cowden

## 2016-10-28 NOTE — Progress Notes (Signed)
Pre visit review using our clinic review tool, if applicable. No additional management support is needed unless otherwise documented below in the visit note. 

## 2016-11-08 ENCOUNTER — Other Ambulatory Visit: Payer: Self-pay | Admitting: Internal Medicine

## 2016-12-15 ENCOUNTER — Other Ambulatory Visit: Payer: Self-pay | Admitting: Internal Medicine

## 2017-02-13 ENCOUNTER — Other Ambulatory Visit: Payer: Self-pay | Admitting: Internal Medicine

## 2017-02-24 ENCOUNTER — Other Ambulatory Visit: Payer: Self-pay | Admitting: Internal Medicine

## 2017-03-31 ENCOUNTER — Encounter: Payer: Self-pay | Admitting: Family Medicine

## 2017-03-31 ENCOUNTER — Other Ambulatory Visit: Payer: Self-pay | Admitting: Internal Medicine

## 2017-03-31 ENCOUNTER — Ambulatory Visit (INDEPENDENT_AMBULATORY_CARE_PROVIDER_SITE_OTHER): Payer: 59 | Admitting: Family Medicine

## 2017-03-31 VITALS — BP 127/83 | HR 71 | Temp 99.0°F | Ht 65.0 in | Wt 130.0 lb

## 2017-03-31 DIAGNOSIS — N12 Tubulo-interstitial nephritis, not specified as acute or chronic: Secondary | ICD-10-CM | POA: Diagnosis not present

## 2017-03-31 LAB — POC URINALSYSI DIPSTICK (AUTOMATED)
BILIRUBIN UA: NEGATIVE
Glucose, UA: NEGATIVE
Ketones, UA: NEGATIVE
Leukocytes, UA: NEGATIVE
PH UA: 6 (ref 5.0–8.0)
Protein, UA: NEGATIVE
Urobilinogen, UA: 0.2 E.U./dL

## 2017-03-31 MED ORDER — CEFTRIAXONE SODIUM 1 G IJ SOLR
1.0000 g | Freq: Once | INTRAMUSCULAR | Status: AC
  Administered 2017-03-31: 1 g via INTRAMUSCULAR

## 2017-03-31 MED ORDER — CIPROFLOXACIN HCL 500 MG PO TABS: 500.0000 mg | ORAL_TABLET | Freq: Two times a day (BID) | ORAL | 0 refills | Status: DC

## 2017-03-31 NOTE — Progress Notes (Signed)
   Subjective:    Patient ID: [name], female    DOB: [birth year], 61 y.o.   MRN: [id number]  HPI Here for the onset last night of lower abdominal pain and lower back pain. The pains come and go. She has been nauseated but has not vomited. She felt very hot last night but there has been no documented fever. Her BMs are normal and she had one this morning. This was normal in consistency and passing it had no effect on her symptoms. 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 She was treated successfully then with Mesalamine. Today she denies any urinary burning or urgency.    Review of Systems  Constitutional: Negative.   Respiratory: Negative.   Cardiovascular: Negative.   Gastrointestinal: Positive for abdominal pain and nausea. Negative for abdominal distention, anal bleeding, blood in stool, constipation, diarrhea, rectal pain and vomiting.  Genitourinary: Negative.        Objective:   Physical Exam  Constitutional: She appears well-developed and well-nourished.  She appears to be uncomfortable   Cardiovascular: Normal rate, regular rhythm, normal heart sounds and intact distal pulses.   Pulmonary/Chest: Effort normal and breath sounds normal. No respiratory distress. She has no wheezes. She has no rales.  Abdominal: Soft. Bowel sounds are normal. She exhibits no distension and no mass. There is no rebound and no guarding.  She is moderately tender across the entire lower abdomen. There is positive CVA tenderness to percussion in the left middle back           Assessment & Plan:  Pyelonephritis, treat with a shot of Rocephin and 7 days of Cipro. Drink plenty of water. We will send the sample for a culture.  Alysia Penna, MD

## 2017-03-31 NOTE — Patient Instructions (Signed)
WE NOW OFFER    [facility] FAST TRACK!!!  SAME DAY Appointments for ACUTE CARE  Such as: Sprains, Injuries, cuts, abrasions, rashes, muscle pain, joint pain, back pain Colds, flu, sore throats, headache, allergies, cough, fever  Ear pain, sinus and eye infections Abdominal pain, nausea, vomiting, diarrhea, upset stomach Animal/insect bites  3 Easy Ways to Schedule: Walk-In Scheduling Call in scheduling Mychart Sign-up: [email or web address]         

## 2017-04-02 ENCOUNTER — Ambulatory Visit (INDEPENDENT_AMBULATORY_CARE_PROVIDER_SITE_OTHER): Payer: 59 | Admitting: Family Medicine

## 2017-04-02 ENCOUNTER — Encounter: Payer: Self-pay | Admitting: Family Medicine

## 2017-04-02 VITALS — BP 134/78 | HR 75 | Temp 98.3°F | Ht 65.0 in | Wt 130.0 lb

## 2017-04-02 DIAGNOSIS — N12 Tubulo-interstitial nephritis, not specified as acute or chronic: Secondary | ICD-10-CM | POA: Diagnosis not present

## 2017-04-02 LAB — URINE CULTURE

## 2017-04-02 MED ORDER — ONDANSETRON HCL 8 MG PO TABS: 8.0000 mg | ORAL_TABLET | Freq: Three times a day (TID) | ORAL | 0 refills | Status: DC | PRN

## 2017-04-02 MED ORDER — SULFAMETHOXAZOLE-TRIMETHOPRIM 400-80 MG PO TABS: 1.0000 | ORAL_TABLET | Freq: Two times a day (BID) | ORAL | 0 refills | Status: DC

## 2017-04-02 NOTE — Progress Notes (Signed)
   Subjective:    Patient ID: [name], female    DOB: 12/15/1955, 61 y.o.   MRN: [id number]  HPI Here to follow up on a UTI. We saw her on 03-31-17 for lower abdominal and lower back pains. Her UA was positive for blood and nitrites. Her culture is growing E coli, but sensitivities are still pending. She was given a shot of Rocephin and was started on Cipro 500 mg for 7 days. She feels no better today however with abdominal and back pains with nausea. No vomiting or fever. She is drinking plenty of fluids.    Review of Systems  Constitutional: Negative.   Respiratory: Negative.   Cardiovascular: Negative.   Gastrointestinal: Positive for abdominal pain and nausea. Negative for abdominal distention, blood in stool, constipation, diarrhea, rectal pain and vomiting.  Genitourinary: Positive for frequency. Negative for dysuria and urgency.  Musculoskeletal: Positive for back pain.       Objective:   Physical Exam  Constitutional:  She appears somewhat ill   Cardiovascular: Normal rate, regular rhythm, normal heart sounds and intact distal pulses.   Pulmonary/Chest: Effort normal and breath sounds normal. No respiratory distress. She has no wheezes. She has no rales.  Abdominal: Soft. Bowel sounds are normal. She exhibits no distension and no mass. There is no rebound and no guarding.  Mildly tender across the lower abdomen. Positive CVAT on both sides           Assessment & Plan:  Partially treated pyelonephritis. Stop the Cipro and switch to Bactrim DS bid. Await the sensitivities results.  Use Zofran for nausea.  Alysia Penna, MD

## 2017-04-02 NOTE — Patient Instructions (Signed)
WE NOW OFFER   [facility] FAST TRACK!!!  SAME DAY Appointments for ACUTE CARE  Such as: Sprains, Injuries, cuts, abrasions, rashes, muscle pain, joint pain, back pain Colds, flu, sore throats, headache, allergies, cough, fever  Ear pain, sinus and eye infections Abdominal pain, nausea, vomiting, diarrhea, upset stomach Animal/insect bites  3 Easy Ways to Schedule: Walk-In Scheduling Call in scheduling Mychart Sign-up: [email or web address]         

## 2017-04-13 ENCOUNTER — Encounter: Payer: Self-pay | Admitting: Internal Medicine

## 2017-04-13 ENCOUNTER — Ambulatory Visit (INDEPENDENT_AMBULATORY_CARE_PROVIDER_SITE_OTHER): Payer: 59 | Admitting: Internal Medicine

## 2017-04-13 VITALS — BP 122/70 | HR 70 | Temp 98.1°F | Ht 65.0 in | Wt 132.8 lb

## 2017-04-13 DIAGNOSIS — N390 Urinary tract infection, site not specified: Secondary | ICD-10-CM | POA: Diagnosis not present

## 2017-04-13 LAB — POC URINALSYSI DIPSTICK (AUTOMATED)
Bilirubin, UA: NEGATIVE
Blood, UA: NEGATIVE
Glucose, UA: NEGATIVE
Ketones, UA: NEGATIVE
LEUKOCYTES UA: NEGATIVE
NITRITE UA: NEGATIVE
Protein, UA: NEGATIVE
UROBILINOGEN UA: 0.2 U/dL
pH, UA: 6 (ref 5.0–8.0)

## 2017-04-13 NOTE — Patient Instructions (Signed)
Call or return to clinic prn if these symptoms worsen or fail to improve as anticipated.

## 2017-04-13 NOTE — Progress Notes (Signed)
Subjective:    Patient ID: Edrick Oh, female    DOB: 1956/03/20, 61 y.o.   MRN: [id number]  HPI  61 year old patient who is seen today in follow-up. She presented 2 weeks ago with fever, abdominal and flank pain and was treated for polynephritis.  Urine culture revealed Escherichia coli which was pansensitive.  She has completed antibiotic therapy and today feels well. No further abdominal or flank pain  Past Medical History:  Diagnosis Date  . Anxiety   . Colitis   . Depression   . Migraine   . Palpitations      Social History   Social History  . Marital status: Married    Spouse name: N/A  . Number of children: 3  . Years of education: N/A   Occupational History  . insurance    Social History Main Topics  . Smoking status: Current Every Day Smoker    Packs/day: 1.00    Types: Cigarettes  . Smokeless tobacco: Never Used     Comment: quit date: 04/23/2015. ADE with Chantix  . Alcohol use 0.0 oz/week  . Drug use: No  . Sexual activity: Not on file   Other Topics Concern  . Not on file   Social History Narrative   Divorced   3 children          Past Surgical History:  Procedure Laterality Date  . APPENDECTOMY Right   . TONSILLECTOMY      Family History  Problem Relation Age of Onset  . Prostate cancer Father   . Hypertension Mother     No Known Allergies  Current Outpatient Prescriptions on File Prior to Visit  Medication Sig Dispense Refill  . ALPRAZolam (XANAX) 0.25 MG tablet TAKE 1 TABLET BY MOUTH TWICE A DAY AS NEEDED FOR ANXIETY 50 tablet 0  . buPROPion (WELLBUTRIN XL) 150 MG 24 hr tablet TAKE 1 TABLET (150 MG TOTAL) BY MOUTH DAILY. 6 tablet 0  . Multiple Vitamin (MULTIVITAMIN) tablet Take 1 tablet by mouth daily.    . ondansetron (ZOFRAN) 8 MG tablet Take 1 tablet (8 mg total) by mouth every 8 (eight) hours as needed for nausea or vomiting. 30 tablet 0   No current facility-administered medications on file prior to visit.     BP  122/70 (BP Location: Left Arm, Patient Position: Sitting, Cuff Size: Normal)   Pulse 70   Temp 98.1 F (36.7 C) (Oral)   Ht 5' 5"  (1.651 m)   Wt 132 lb 12.8 oz (60.2 kg)   SpO2 98%   BMI 22.10 kg/m     Review of Systems  Constitutional: Negative.   HENT: Negative for congestion, dental problem, hearing loss, rhinorrhea, sinus pressure, sore throat and tinnitus.   Eyes: Negative for pain, discharge and visual disturbance.  Respiratory: Negative for cough and shortness of breath.   Cardiovascular: Negative for chest pain, palpitations and leg swelling.  Gastrointestinal: Positive for abdominal pain. Negative for abdominal distention, blood in stool, constipation, diarrhea, nausea and vomiting.  Genitourinary: Negative for difficulty urinating, dysuria, flank pain, frequency, hematuria, pelvic pain, urgency, vaginal bleeding, vaginal discharge and vaginal pain.  Musculoskeletal: Negative for arthralgias, gait problem and joint swelling.  Skin: Negative for rash.  Neurological: Negative for dizziness, syncope, speech difficulty, weakness, numbness and headaches.  Hematological: Negative for adenopathy.  Psychiatric/Behavioral: Negative for agitation, behavioral problems and dysphoric mood. The patient is not nervous/anxious.        Objective:   Physical Exam  Constitutional: She  is oriented to person, place, and time. She appears well-developed and well-nourished. No distress.  Clinically, appears well Afebrile  HENT:  Head: Normocephalic.  Right Ear: External ear normal.  Left Ear: External ear normal.  Mouth/Throat: Oropharynx is clear and moist.  Eyes: Pupils are equal, round, and reactive to light. Conjunctivae and EOM are normal.  Neck: Normal range of motion. Neck supple. No thyromegaly present.  Cardiovascular: Normal rate, regular rhythm, normal heart sounds and intact distal pulses.   Pulmonary/Chest: Effort normal and breath sounds normal.  Abdominal: Soft. Bowel  sounds are normal. She exhibits no mass. There is no tenderness.  No further CVA tenderness on the left or lower abdominal discomfort  Musculoskeletal: Normal range of motion.  Lymphadenopathy:    She has no cervical adenopathy.  Neurological: She is alert and oriented to person, place, and time.  Skin: Skin is warm and dry. No rash noted.  Psychiatric: She has a normal mood and affect. Her behavior is normal.          Assessment & Plan:   Polynephritis.  Patient has had a very nice clinical response to antibiotic therapy Annual clinical exam as scheduled Urinalysis reviewed today and was normal  Nyoka Cowden

## 2017-05-21 ENCOUNTER — Other Ambulatory Visit: Payer: Self-pay | Admitting: Internal Medicine

## 2017-06-04 ENCOUNTER — Other Ambulatory Visit: Payer: Self-pay | Admitting: Internal Medicine

## 2017-06-05 ENCOUNTER — Other Ambulatory Visit: Payer: Self-pay | Admitting: Internal Medicine

## 2017-07-13 ENCOUNTER — Other Ambulatory Visit: Payer: Self-pay | Admitting: Internal Medicine

## 2017-09-02 ENCOUNTER — Other Ambulatory Visit: Payer: Self-pay | Admitting: Internal Medicine

## 2017-09-03 NOTE — Telephone Encounter (Signed)
Okay for refill #60

## 2017-09-03 NOTE — Telephone Encounter (Signed)
Last filled 07/13/17 for #50 Seen on 04/13/17 for UTI No future appt scheduled Please advise.  Thanks

## 2017-09-07 ENCOUNTER — Telehealth: Payer: Self-pay | Admitting: Internal Medicine

## 2017-09-07 NOTE — Telephone Encounter (Signed)
Patient said the pharmacy advised her they do not have the request for either script

## 2017-09-07 NOTE — Telephone Encounter (Signed)
Xanax request

## 2017-09-07 NOTE — Telephone Encounter (Signed)
Copied from [place]. Topic: Quick Communication - See Telephone Encounter >> Sep 07, 2017  3:47 PM Cleaster Corin, NT wrote: CRM for notification. See Telephone encounter for:   09/07/17. Pt. calling to get a refill on xanax pt. Has been calling since last week to get a refill and said that wrong med. Was filled.Can be reached at [phone]

## 2017-09-08 ENCOUNTER — Other Ambulatory Visit: Payer: Self-pay | Admitting: Internal Medicine

## 2017-09-08 MED ORDER — ALPRAZOLAM 0.25 MG PO TABS: ORAL_TABLET | ORAL | 0 refills | Status: DC

## 2017-09-08 NOTE — Telephone Encounter (Signed)
Copied from [place]. Topic: Quick Communication - See Telephone Encounter >> Sep 08, 2017 10:16 AM [name], Helene Kelp D wrote: Patient is upset because she has been trying to get her medication for a week and she still has not been able to get it. Please call patient when medication is ready for pick-up or if it has been sent to her pharmacy, thanks.

## 2017-09-09 NOTE — Telephone Encounter (Signed)
Rx was called in & then faxed to pharmacy on 09/08/17. Rx was placed in the front desk by office administrator this morning (09/09/17) for pt pick up.

## 2017-09-28 ENCOUNTER — Ambulatory Visit: Payer: 59 | Admitting: Adult Health

## 2017-09-28 ENCOUNTER — Encounter: Payer: Self-pay | Admitting: Adult Health

## 2017-09-28 VITALS — BP 124/76 | Temp 98.1°F | Wt 137.0 lb

## 2017-09-28 DIAGNOSIS — L02416 Cutaneous abscess of left lower limb: Secondary | ICD-10-CM | POA: Diagnosis not present

## 2017-09-28 MED ORDER — DOXYCYCLINE HYCLATE 100 MG PO CAPS: 100.0000 mg | ORAL_CAPSULE | Freq: Two times a day (BID) | ORAL | 0 refills | Status: AC

## 2017-09-28 NOTE — Progress Notes (Signed)
Subjective:    Patient ID: [name], female    DOB: 27-May-1956, 62 y.o.   MRN: [id number]  HPI  62 year old female who  has a past medical history of Anxiety, Colitis, Depression, Migraine, and Palpitations. She is a pleasant 62 year old female who  has a past medical history of Anxiety, Colitis, Depression, Migraine, and Palpitations. She presents to the office today for abscess on left thigh. First noticed abscess about 2 weeks ago. It recently came to a head and she squeezed most of the pus out. Now complaining of not healing and pain. Denies any recent drainage or fevers   Review of Systems See HPI   Past Medical History:  Diagnosis Date  . Anxiety   . Colitis   . Depression   . Migraine   . Palpitations     Social History   Socioeconomic History  . Marital status: Married    Spouse name: Not on file  . Number of children: 3  . Years of education: Not on file  . Highest education level: Not on file  Social Needs  . Financial resource strain: Not on file  . Food insecurity - worry: Not on file  . Food insecurity - inability: Not on file  . Transportation needs - medical: Not on file  . Transportation needs - non-medical: Not on file  Occupational History  . Occupation: insurance  Tobacco Use  . Smoking status: Current Every Day Smoker    Packs/day: 1.00    Types: Cigarettes  . Smokeless tobacco: Never Used  . Tobacco comment: quit date: 04/23/2015. ADE with Chantix  Substance and Sexual Activity  . Alcohol use: Yes    Alcohol/week: 0.0 oz  . Drug use: No  . Sexual activity: Not on file  Other Topics Concern  . Not on file  Social History Narrative   Divorced   3 children          Past Surgical History:  Procedure Laterality Date  . APPENDECTOMY Right   . TONSILLECTOMY      Family History  Problem Relation Age of Onset  . Prostate cancer Father   . Hypertension Mother     No Known Allergies  Current Outpatient Medications on File Prior to  Visit  Medication Sig Dispense Refill  . ALPRAZolam (XANAX) 0.25 MG tablet TAKE 1 TABLET BY MOUTH TWICE A DAY AS NEEDED ANXIETY 50 tablet 0  . buPROPion (WELLBUTRIN XL) 150 MG 24 hr tablet TAKE 1 TABLET BY MOUTH EVERY DAY 90 tablet 0  . Multiple Vitamin (MULTIVITAMIN) tablet Take 1 tablet by mouth daily.     No current facility-administered medications on file prior to visit.     BP 124/76 (BP Location: Left Arm)   Temp 98.1 F (36.7 C) (Oral)   Wt 137 lb (62.1 kg)   BMI 22.80 kg/m       Objective:   Physical Exam  Constitutional: She is oriented to person, place, and time. She appears well-developed and well-nourished. No distress.  Cardiovascular: Normal rate, regular rhythm, normal heart sounds and intact distal pulses. Exam reveals no gallop and no friction rub.  No murmur heard. Pulmonary/Chest: Effort normal and breath sounds normal. No respiratory distress. She has no wheezes. She has no rales. She exhibits no tenderness.  Neurological: She is alert and oriented to person, place, and time.  Skin: Skin is warm and dry. She is not diaphoretic.  Quarter sized cutaneous abscess on left thigh. Localized  erythema, black eschar scab, non fluctuant, no drainage noted  Psychiatric: She has a normal mood and affect. Her behavior is normal. Judgment and thought content normal.  Nursing note and vitals reviewed.     Assessment & Plan:  1. Cutaneous abscess of left lower extremity - No need for I&D at this time. Advised heating pad and if abscess comes to a head then follow up for drainage. Ok to use heating pad throughout the day  - doxycycline (VIBRAMYCIN) 100 MG capsule; Take 1 capsule (100 mg total) by mouth 2 (two) times daily for 14 days.  Dispense: 28 capsule; Refill: 0  Dorothyann Peng, NP

## 2017-10-25 ENCOUNTER — Other Ambulatory Visit: Payer: Self-pay | Admitting: Internal Medicine

## 2017-10-30 NOTE — Telephone Encounter (Signed)
Patient checking status, please advise. Call back [phone]

## 2017-12-14 ENCOUNTER — Other Ambulatory Visit: Payer: Self-pay | Admitting: Internal Medicine

## 2017-12-15 NOTE — Telephone Encounter (Signed)
Patient is coming in 12/16/2017 for refills!

## 2017-12-16 ENCOUNTER — Encounter: Payer: Self-pay | Admitting: Internal Medicine

## 2017-12-16 ENCOUNTER — Ambulatory Visit: Payer: 59 | Admitting: Internal Medicine

## 2017-12-16 VITALS — BP 110/68 | HR 84 | Temp 99.0°F | Wt 137.8 lb

## 2017-12-16 DIAGNOSIS — F320 Major depressive disorder, single episode, mild: Secondary | ICD-10-CM

## 2017-12-16 MED ORDER — BUPROPION HCL ER (XL) 150 MG PO TB24: 150.0000 mg | ORAL_TABLET | Freq: Every day | ORAL | 4 refills | Status: DC

## 2017-12-16 MED ORDER — ALPRAZOLAM 0.25 MG PO TABS: ORAL_TABLET | ORAL | 2 refills | Status: DC

## 2017-12-16 NOTE — Progress Notes (Signed)
Subjective:    Patient ID: [name], female    DOB: [birth year], 62 y.o.   MRN: [id number]  HPI  62 year old patient who is seen today for follow-up of depression.  She has been on bupropion for some time and has done quite well on this regimen she has had at least 2 trials of self discontinuation with relapse.  She is also on alprazolam that she takes basically once in the evening for anxiety. Clinically she feels that she has done quite well  Past Medical History:  Diagnosis Date  . Anxiety   . Colitis   . Depression   . Migraine   . Palpitations      Social History   Socioeconomic History  . Marital status: Married    Spouse name: Not on file  . Number of children: 3  . Years of education: Not on file  . Highest education level: Not on file  Occupational History  . Occupation: Sales executive  . Financial resource strain: Not on file  . Food insecurity:    Worry: Not on file    Inability: Not on file  . Transportation needs:    Medical: Not on file    Non-medical: Not on file  Tobacco Use  . Smoking status: Current Every Day Smoker    Packs/day: 1.00    Types: Cigarettes  . Smokeless tobacco: Never Used  . Tobacco comment: quit date: 04/23/2015. ADE with Chantix  Substance and Sexual Activity  . Alcohol use: Yes    Alcohol/week: 0.0 oz  . Drug use: No  . Sexual activity: Not on file  Lifestyle  . Physical activity:    Days per week: Not on file    Minutes per session: Not on file  . Stress: Not on file  Relationships  . Social connections:    Talks on phone: Not on file    Gets together: Not on file    Attends religious service: Not on file    Active member of club or organization: Not on file    Attends meetings of clubs or organizations: Not on file    Relationship status: Not on file  . Intimate partner violence:    Fear of current or ex partner: Not on file    Emotionally abused: Not on file    Physically abused: Not on file   Forced sexual activity: Not on file  Other Topics Concern  . Not on file  Social History Narrative   Divorced   3 children          Past Surgical History:  Procedure Laterality Date  . APPENDECTOMY Right   . TONSILLECTOMY      Family History  Problem Relation Age of Onset  . Prostate cancer Father   . Hypertension Mother     No Known Allergies  Current Outpatient Medications on File Prior to Visit  Medication Sig Dispense Refill  . Multiple Vitamin (MULTIVITAMIN) tablet Take 1 tablet by mouth daily.     No current facility-administered medications on file prior to visit.     BP 110/68 (BP Location: Right Arm, Patient Position: Sitting, Cuff Size: Normal)   Pulse 84   Temp 99 F (37.2 C) (Oral)   Wt 137 lb 12.8 oz (62.5 kg)   SpO2 97%   BMI 22.93 kg/m     Review of Systems  Constitutional: Negative.   HENT: Negative for congestion, dental problem, hearing loss, rhinorrhea, sinus pressure, sore throat and  tinnitus.   Eyes: Negative for pain, discharge and visual disturbance.  Respiratory: Negative for cough and shortness of breath.   Cardiovascular: Negative for chest pain, palpitations and leg swelling.  Gastrointestinal: Negative for abdominal distention, abdominal pain, blood in stool, constipation, diarrhea, nausea and vomiting.  Genitourinary: Negative for difficulty urinating, dysuria, flank pain, frequency, hematuria, pelvic pain, urgency, vaginal bleeding, vaginal discharge and vaginal pain.  Musculoskeletal: Negative for arthralgias, gait problem and joint swelling.  Skin: Negative for rash.  Neurological: Negative for dizziness, syncope, speech difficulty, weakness, numbness and headaches.  Hematological: Negative for adenopathy.  Psychiatric/Behavioral: Positive for sleep disturbance. Negative for agitation, behavioral problems and dysphoric mood. The patient is nervous/anxious.        Objective:   Physical Exam  Constitutional: She is oriented to  person, place, and time. She appears well-developed and well-nourished. No distress.  HENT:  Head: Normocephalic.  Mouth/Throat: Oropharynx is clear and moist.  Eyes: Pupils are equal, round, and reactive to light. Conjunctivae and EOM are normal.  Neck: Neck supple. No thyromegaly present.  Cardiovascular: Normal rate, regular rhythm, normal heart sounds and intact distal pulses.  Pulmonary/Chest: Effort normal and breath sounds normal.  Abdominal: Soft. Bowel sounds are normal. She exhibits no mass. There is no tenderness.  Lymphadenopathy:    She has no cervical adenopathy.  Neurological: She is alert and oriented to person, place, and time.  Skin: Skin is warm and dry. No rash noted.  Psychiatric: She has a normal mood and affect. Her behavior is normal.          Assessment & Plan:   Depression.  Clinically stable.  No change in medical regimen Medications updated  Suggest CPX 6 months  Hendel Gatliff Homero Fellers

## 2017-12-16 NOTE — Patient Instructions (Signed)
Return in 6 months for an  annual preventive health examination

## 2017-12-21 ENCOUNTER — Telehealth: Payer: Self-pay | Admitting: Internal Medicine

## 2017-12-21 NOTE — Telephone Encounter (Signed)
Copied from [place] [id number]. Topic: Quick Communication - See Telephone Encounter >> Dec 21, 2017  9:06 AM Boyd Kerbs wrote: CRM for notification.   Pt. [name] was in to see doctor and says she can not find any of the paperwork (she may have thrown away)  Asking if can have doctor can call prescription in,  she is out of medicaiton Please call pt.   [id number] [place], [place] - [address] [place] [id number]: [phone]: [phone]   See Telephone encounter for: 12/21/17.

## 2017-12-23 MED ORDER — ALPRAZOLAM 0.25 MG PO TABS: ORAL_TABLET | ORAL | 2 refills | Status: DC

## 2017-12-24 NOTE — Telephone Encounter (Signed)
Rx phoned in patient aware of update.

## 2018-01-18 ENCOUNTER — Other Ambulatory Visit: Payer: Self-pay

## 2018-01-18 ENCOUNTER — Emergency Department
Admission: EM | Admit: 2018-01-18 | Discharge: 2018-01-18 | Disposition: A | Discharge status: Home / Self Care | Payer: 59 | Source: Home / Self Care

## 2018-01-18 ENCOUNTER — Encounter: Payer: Self-pay | Admitting: *Deleted

## 2018-01-18 DIAGNOSIS — R197 Diarrhea, unspecified: Secondary | ICD-10-CM

## 2018-01-18 DIAGNOSIS — R350 Frequency of micturition: Secondary | ICD-10-CM

## 2018-01-18 DIAGNOSIS — R1084 Generalized abdominal pain: Secondary | ICD-10-CM

## 2018-01-18 LAB — POCT URINALYSIS DIP (MANUAL ENTRY)
Glucose, UA: NEGATIVE mg/dL
Nitrite, UA: NEGATIVE
Spec Grav, UA: >=1.03 — AB (ref 1.010–1.025)
Urobilinogen, UA: 0.2 E.U./dL
pH, UA: 5.5 (ref 5.0–8.0)

## 2018-01-18 MED ORDER — METRONIDAZOLE 500 MG PO TABS: 500.0000 mg | ORAL_TABLET | Freq: Two times a day (BID) | ORAL | 0 refills | Status: DC

## 2018-01-18 NOTE — ED Provider Notes (Signed)
[facility] [place]   [id number] 01/18/18 Arrival Time: 1612   SUBJECTIVE:  [name] is a 62 y.o. female who presents to the urgent care with complaint of diarrhea, belching, bad taste in mouth and malaise beginning this morning.   Two days ago, patient was at the beach and ate some crayfish.  No one else in her party is sick as far as she knows  The diarrhea the patient described is loose stools x 3 with no blood. She notes some aching in her posterior iliac area.  Patient also notes some urinary frequency today.  Last UTI was 6 months ago and began with frequency.  Patient had a bowl of cereal for breakfast this morning, nothing since.  No lightheadedness or significant abdominal pain.  No vomiting although patient has lost her appetite.  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   Past Medical History:  Diagnosis Date  . Anxiety   . Colitis   . Depression   . Migraine   . Palpitations    Family History  Problem Relation Age of Onset  . Prostate cancer Father   . Hypertension Mother    Social History   Socioeconomic History  . Marital status: Married    Spouse name: Not on file  . Number of children: 3  . Years of education: Not on file  . Highest education level: Not on file  Occupational History  . Occupation: Science writer  . Financial resource strain: Not on file  . Food insecurity:    Worry: Not on file    Inability: Not on file  . Transportation needs:    Medical: Not on file    Non-medical: Not on file  Tobacco Use  . Smoking status: Current Every Day Smoker    Packs/day: 1.00    Types: Cigarettes  . Smokeless tobacco: Never Used  . Tobacco comment: quit date: 04/23/2015. ADE with Chantix  Substance and Sexual Activity  . Alcohol use: Yes    Alcohol/week: 0.0 oz  . Drug use: No  . Sexual activity: Not on file  Lifestyle  . Physical activity:    Days per week: Not on file    Minutes per session: Not on file  . Stress: Not on file    Relationships  . Social connections:    Talks on phone: Not on file    Gets together: Not on file    Attends religious service: Not on file    Active member of club or organization: Not on file    Attends meetings of clubs or organizations: Not on file    Relationship status: Not on file  . Intimate partner violence:    Fear of current or ex partner: Not on file    Emotionally abused: Not on file    Physically abused: Not on file    Forced sexual activity: Not on file  Other Topics Concern  . Not on file  Social History Narrative   Divorced   3 children         Current Meds  Medication Sig  . ALPRAZolam (XANAX) 0.25 MG tablet TAKE 1 TABLET BY MOUTH TWICE DAILY AS NEEDED  . buPROPion (WELLBUTRIN XL) 150 MG 24 hr tablet Take 1 tablet (150 mg total) by mouth daily.   No Known Allergies    ROS: As per HPI, remainder of ROS negative.   OBJECTIVE:   Vitals:   01/18/18 1634 01/18/18 1635  BP: 126/77   Pulse:  100   Resp: 16   Temp: 98.6 F (37 C)   TempSrc: Oral   SpO2: 98%   Weight:  133 lb (60.3 kg)  Height:  5' 5"  (1.651 m)     General appearance: alert; no distress Eyes: PERRL; EOMI; conjunctiva normal HENT: normocephalic; atraumatic; oral mucosa normal Neck: supple Abdomen: soft, non-tender; bowel sounds normal; no masses or organomegaly; no guarding or rebound tenderness Back: no CVA tenderness Extremities: no cyanosis or edema; symmetrical with no gross deformities Skin: warm and dry Neurologic: normal gait; grossly normal Psychological: alert and cooperative; normal mood and affect   Labs:  Results for orders placed or performed during the hospital encounter of 01/18/18  POCT urinalysis dipstick  Result Value Ref Range   Color, UA yellow yellow   Clarity, UA clear clear   Glucose, UA negative negative mg/dL   Bilirubin, UA small (A) negative   Ketones, POC UA moderate (40) (A) negative mg/dL   Spec Grav, UA >=1.030 (A) 1.010 - 1.025   Blood,  UA small (A) negative   pH, UA 5.5 5.0 - 8.0   Protein Ur, POC trace (A) negative mg/dL   Urobilinogen, UA 0.2 0.2 or 1.0 E.U./dL   Nitrite, UA Negative Negative   Leukocytes, UA Trace (A) Negative    Labs Reviewed  POCT URINALYSIS DIP (MANUAL ENTRY) - Abnormal; Notable for the following components:      Result Value   Bilirubin, UA small (*)    Ketones, POC UA moderate (40) (*)    Spec Grav, UA >=1.030 (*)    Blood, UA small (*)    Protein Ur, POC trace (*)    Leukocytes, UA Trace (*)    All other components within normal limits    No results found.     ASSESSMENT & PLAN:  1. Diarrhea, unspecified type   Try to keep from getting dehydrated by taking sips of clear liquids every half hour while awake.  No orders of the defined types were placed in this encounter.  The history of recent trip to the beach eating shellfish as well as the belching and bad taste in mouth suggests you may have contracted a germ called "giardia."  There is no specific test for this.  But I think there is enough evidence for giardia to start treatment  Reviewed expectations re: course of current medical issues. Questions answered. Outlined signs and symptoms indicating need for more acute intervention. Patient verbalized understanding. After Visit Summary given.    Procedures:      Robyn Haber, MD 01/18/18 1717

## 2018-01-18 NOTE — ED Triage Notes (Signed)
Pt c/o generalized abd pain, nausea and diarrhea x this morning. Denies vomiting, dysuria, or fever.

## 2018-01-18 NOTE — Discharge Instructions (Addendum)
Try to keep from getting dehydrated by taking sips of clear liquids every half hour while awake.  The history of recent trip to the beach eating shellfish as well as the belching and bad taste in mouth suggests you may have contracted a germ called "giardia."  There is no specific test for this.  But I think there is enough evidence for giardia to start treatment  There is no obvious urinary tract infection, but we will run a culture to make sure

## 2018-01-20 ENCOUNTER — Telehealth: Payer: Self-pay | Admitting: *Deleted

## 2018-01-20 LAB — URINE CULTURE
MICRO NUMBER:: 90639617
SPECIMEN QUALITY:: ADEQUATE

## 2018-01-20 MED ORDER — CEPHALEXIN 500 MG PO CAPS: 500.0000 mg | ORAL_CAPSULE | Freq: Two times a day (BID) | ORAL | 0 refills | Status: AC

## 2018-01-20 NOTE — Telephone Encounter (Signed)
LM to call back for lab results. Per phelps, PA if she developed urinary s/s since her visit or still having abd pain and N/V/D can call in keflex 5110m I po BID x 7 days. If she is feeling better then no ABT is needed.

## 2018-01-21 ENCOUNTER — Telehealth (HOSPITAL_COMMUNITY): Payer: Self-pay

## 2018-01-21 MED ORDER — SULFAMETHOXAZOLE-TRIMETHOPRIM 800-160 MG PO TABS: 1.0000 | ORAL_TABLET | Freq: Two times a day (BID) | ORAL | 0 refills | Status: AC

## 2018-01-21 NOTE — Telephone Encounter (Signed)
Urine culture positive for E.Coli, pt was given keflex at visit. This will not treat bacteria.  Bactrim bid x 5 days called into pharmacy of choice. Patient called and made aware.

## 2018-01-25 ENCOUNTER — Telehealth: Payer: Self-pay | Admitting: Family Medicine

## 2018-01-25 NOTE — Telephone Encounter (Signed)
Copied from [place] [id number]. Topic: Referral - Request >> Jan 25, 2018  4:14 PM Vernona Rieger wrote: Reason for CRM: Patient said it is time for her colonoscopy but she does not remember who did it the last time. Please advise. [phone]

## 2018-01-26 NOTE — Telephone Encounter (Signed)
Spoke to pt and informed her of the information she needed. No further action needed.

## 2018-06-04 ENCOUNTER — Other Ambulatory Visit: Payer: Self-pay | Admitting: Internal Medicine

## 2018-06-04 NOTE — Telephone Encounter (Signed)
Okay for refill? Please advise  Pt did not know of Dr.Kwiatkowski retirement. Pt is thinking between [place].

## 2018-07-29 ENCOUNTER — Encounter: Payer: 59 | Admitting: Internal Medicine

## 2018-08-19 ENCOUNTER — Other Ambulatory Visit: Payer: Self-pay | Admitting: Internal Medicine

## 2018-08-19 NOTE — Telephone Encounter (Signed)
Patient is calling in regards to a update on her medication request. The patient would like a call back once completed. Please advise

## 2018-08-19 NOTE — Telephone Encounter (Signed)
Last Filled 06/07/18 Last OV 12/16/17 Dr. Raliegh Ip  upcomming TOC with Dr. Jerilee Hoh on 09/01/18

## 2018-08-19 NOTE — Telephone Encounter (Signed)
Copied from [place] [id number]. Topic: General - Other >> Aug 19, 2018 11:43 AM Lennox Solders wrote: Reason for CRM: pt is calling and needs a refill on alprazolam . [facility] . Pt will see dr Jerilee Hoh on 09/01/18. Pt is going out of town today. Pt said she can not leave without her med. Pt is aware may take up to 3 business day

## 2018-08-19 NOTE — Telephone Encounter (Signed)
Requested medication (s) are due for refill today: yes  Requested medication (s) are on the active medication list: yes    Last refill: 06/07/18  #60 0 refills  Future visit scheduled yes  09/01/2018  Dr. Jerilee Hoh  Notes to clinic:not delegated  Requested Prescriptions  Pending Prescriptions Disp Refills   ALPRAZolam (XANAX) 0.25 MG tablet 60 tablet 1    Sig: TAKE 1 TABLET BY MOUTH TWICE A DAY AS NEEDED     Not Delegated - Psychiatry:  Anxiolytics/Hypnotics Failed - 08/19/2018 12:15 PM      Failed - This refill cannot be delegated      Failed - Urine Drug Screen completed in last 360 days.      Failed - Valid encounter within last 6 months    Recent Outpatient Visits          8 months ago Current mild episode of major depressive disorder, unspecified whether recurrent ([place])   [facility] at [facility], Doretha Sou, MD   10 months ago Cutaneous abscess of left lower extremity   Therapist, [facility] at [facility], NP   1 year ago Urinary tract infection without hematuria, site unspecified   Therapist, [facility] at [facility], Doretha Sou, MD   1 year ago Pyelonephritis   Therapist, music at [facility], Ishmael Holter, MD   1 year ago Pyelonephritis   Therapist, music at [facility], Ishmael Holter, MD      Future Appointments            In 1 week Isaac Bliss, Rayford Halsted, MD [facility] [place], [facility]

## 2018-08-19 NOTE — Telephone Encounter (Signed)
Last refill 06/07/18 Last OV 12/16/17 TOC with Dr. Jerilee Hoh on 09/01/17

## 2018-08-24 NOTE — Telephone Encounter (Signed)
Pt is calling in to check the status of her Rx. Please advise if medication will be sent in today?

## 2018-08-26 MED ORDER — ALPRAZOLAM 0.25 MG PO TABS
ORAL_TABLET | ORAL | 0 refills | Status: DC
Start: 2018-08-26 — End: 2018-10-20

## 2018-09-01 ENCOUNTER — Encounter: Payer: Self-pay | Admitting: Internal Medicine

## 2018-09-01 ENCOUNTER — Ambulatory Visit: Payer: 59 | Admitting: Internal Medicine

## 2018-09-01 VITALS — BP 110/78 | HR 72 | Temp 98.4°F | Ht 65.0 in | Wt 137.0 lb

## 2018-09-01 DIAGNOSIS — Z23 Encounter for immunization: Secondary | ICD-10-CM

## 2018-09-01 DIAGNOSIS — G4700 Insomnia, unspecified: Secondary | ICD-10-CM | POA: Diagnosis not present

## 2018-09-01 DIAGNOSIS — F411 Generalized anxiety disorder: Secondary | ICD-10-CM

## 2018-09-01 DIAGNOSIS — F320 Major depressive disorder, single episode, mild: Secondary | ICD-10-CM | POA: Diagnosis not present

## 2018-09-01 DIAGNOSIS — Z720 Tobacco use: Secondary | ICD-10-CM | POA: Diagnosis not present

## 2018-09-01 NOTE — Addendum Note (Signed)
Addended by: Westley Hummer B on: 09/01/2018 03:47 PM   Modules accepted: Orders

## 2018-09-01 NOTE — Patient Instructions (Signed)
-It was nice meeting you today!  -Continue to think about smoking cessation. Highly recommend you schedule an appointment with Mr. Dennison Bulla, our on-site behavioral health counselor.  -Schedule follow up with me in 2 months for your annual physical. Please come fasting to that appointment.   Smoking Tobacco Information, Adult Smoking tobacco can be harmful to your health. Tobacco contains a poisonous (toxic), colorless chemical called nicotine. Nicotine is addictive. It changes the brain and can make it hard to stop smoking. Tobacco also has other toxic chemicals that can hurt your body and raise your risk of many cancers. How can smoking tobacco affect me? Smoking tobacco puts you at risk for:  Cancer. Smoking is most commonly associated with lung cancer, but can also lead to cancer in other parts of the body.  Chronic obstructive pulmonary disease (COPD). This is a long-term lung condition that makes it hard to breathe. It also gets worse over time.  High blood pressure (hypertension), heart disease, stroke, or heart attack.  Lung infections, such as pneumonia.  Cataracts. This is when the lenses in the eyes become clouded.  Digestive problems. This may include peptic ulcers, heartburn, and gastroesophageal reflux disease (GERD).  Oral health problems, such as gum disease and tooth loss.  Loss of taste and smell. Smoking can affect your appearance by causing:  Wrinkles.  Yellow or stained teeth, fingers, and fingernails. Smoking tobacco can also affect your social life, because:  It may be challenging to find places to smoke when away from home. Many workplaces, [employer], hotels, and public places are tobacco-free.  Smoking is expensive. This is due to the cost of tobacco and the long-term costs of treating health problems from smoking.  Secondhand smoke may affect those around you. Secondhand smoke can cause lung cancer, breathing problems, and heart disease. Children  of smokers have a higher risk for: ? Sudden infant death syndrome (SIDS). ? Ear infections. ? Lung infections. If you currently smoke tobacco, quitting now can help you:  Lead a longer and healthier life.  Look, smell, breathe, and feel better over time.  Save money.  Protect others from the harms of secondhand smoke. What actions can I take to prevent health problems? Quit smoking   Do not start smoking. Quit if you already do.  Make a plan to quit smoking and commit to it. Look for programs to help you and ask your health care provider for recommendations and ideas.  Set a date and write down all the reasons you want to quit.  Let your friends and family know you are quitting so they can help and support you. Consider finding friends who also want to quit. It can be easier to quit with someone else, so that you can support each other.  Talk with your health care provider about using nicotine replacement medicines to help you quit, such as gum, lozenges, patches, sprays, or pills.  Do not replace cigarette smoking with electronic cigarettes, which are commonly called e-cigarettes. The safety of e-cigarettes is not known, and some may contain harmful chemicals.  If you try to quit but return to smoking, stay positive. It is common to slip up when you first quit, so take it one day at a time.  Be prepared for cravings. When you feel the urge to smoke, chew gum or suck on hard candy. Lifestyle  Stay busy and take care of your body.  Drink enough fluid to keep your urine pale yellow.  Get plenty of exercise  and eat a healthy diet. This can help prevent weight gain after quitting.  Monitor your eating habits. Quitting smoking can cause you to have a larger appetite than when you smoke.  Find ways to relax. Go out with friends or family to a movie or a restaurant where people do not smoke.  Ask your health care provider about having regular tests (screenings) to check for  cancer. This may include blood tests, imaging tests, and other tests.  Find ways to manage your stress, such as meditation, yoga, or exercise. Where to find support To get support to quit smoking, consider:  Asking your health care provider for more information and resources.  Taking classes to learn more about quitting smoking.  Looking for local organizations that offer resources about quitting smoking.  Joining a support group for people who want to quit smoking in your local community.  Calling the smokefree.gov counselor helpline: 1-800-Quit-Now 563-318-5374) Where to find more information You may find more information about quitting smoking from:  HelpGuide.org: www.helpguide.org  https://hall.com/: smokefree.gov  American Lung Association: www.lung.org Contact a health care provider if you:  Have problems breathing.  Notice that your lips, nose, or fingers turn blue.  Have chest pain.  Are coughing up blood.  Feel faint or you pass out.  Have other health changes that cause you to worry. Summary  Smoking tobacco can negatively affect your health, the health of those around you, your finances, and your social life.  Do not start smoking. Quit if you already do. If you need help quitting, ask your health care provider.  Think about joining a support group for people who want to quit smoking in your local community. There are many effective programs that will help you to quit this behavior. This information is not intended to replace advice given to you by your health care provider. Make sure you discuss any questions you have with your health care provider. Document Released: 08/26/2016 Document Revised: 09/30/2017 Document Reviewed: 08/26/2016 Elsevier Interactive Patient Education  2019 [employer].

## 2018-09-01 NOTE — Progress Notes (Signed)
Established Patient Office Visit     CC/Reason for Visit: Establish care, follow-up of chronic medical conditions  HPI: [name] is a 63 y.o. female who is coming in today for the above mentioned reasons.  She is past due for annual physical exam.  Past Medical History is significant for: Anxiety and depression that has been well controlled on Wellbutrin and 0.25 mg of Xanax at bedtime which she also uses for insomnia.  She tells me she has been fatigued lately but has not been feeling depressed.  She has been feeling this way for months and in fact this is why Dr. Raliegh Ip had restarted the Wellbutrin after she had weaned herself off of it.  She has noticed no difference since being back on Wellbutrin.  She does not feel sad or depressed.  She does have significant difficulty sleeping.  She also smokes 10 to 12 cigarettes a day and has done so for many years.  She has no acute complaints at today's visit.  She would like to receive her tetanus booster today, is not interested in influenza vaccination.   Past Medical/Surgical History: Past Medical History:  Diagnosis Date  . Anxiety   . Colitis   . Depression   . Migraine   . Palpitations     Past Surgical History:  Procedure Laterality Date  . APPENDECTOMY Right   . TONSILLECTOMY      Social History:  reports that she has been smoking cigarettes. She has been smoking about 1.00 pack per day. She has never used smokeless tobacco. She reports current alcohol use. She reports that she does not use drugs.  Allergies: No Known Allergies  Family History:  Family History  Problem Relation Age of Onset  . Prostate cancer Father   . Hypertension Mother      Current Outpatient Medications:  .  ALPRAZolam (XANAX) 0.25 MG tablet, TAKE 1 TABLET BY MOUTH TWICE A DAY AS NEEDED, Disp: 60 tablet, Rfl: 0 .  buPROPion (WELLBUTRIN XL) 150 MG 24 hr tablet, Take 1 tablet (150 mg total) by mouth daily., Disp: 90 tablet, Rfl: 4 .  Multiple  Vitamin (MULTIVITAMIN) tablet, Take 1 tablet by mouth daily., Disp: , Rfl:   Review of Systems:  Constitutional: Denies fever, chills, diaphoresis, appetite change and fatigue.  HEENT: Denies photophobia, eye pain, redness, hearing loss, ear pain, congestion, sore throat, rhinorrhea, sneezing, mouth sores, trouble swallowing, neck pain, neck stiffness and tinnitus.   Respiratory: Denies SOB, DOE, cough, chest tightness,  and wheezing.   Cardiovascular: Denies chest pain, palpitations and leg swelling.  Gastrointestinal: Denies nausea, vomiting, abdominal pain, diarrhea, constipation, blood in stool and abdominal distention.  Genitourinary: Denies dysuria, urgency, frequency, hematuria, flank pain and difficulty urinating.  Endocrine: Denies: hot or cold intolerance, sweats, changes in hair or nails, polyuria, polydipsia. Musculoskeletal: Denies myalgias, back pain, joint swelling, arthralgias and gait problem.  Skin: Denies pallor, rash and wound.  Neurological: Denies dizziness, seizures, syncope, weakness, light-headedness, numbness and headaches.  Hematological: Denies adenopathy. Easy bruising, personal or family bleeding history  Psychiatric/Behavioral: Denies suicidal ideation, mood changes, confusion, nervousness, sleep disturbance and agitation    Physical Exam: Vitals:   09/01/18 1507  BP: 110/78  Pulse: 72  Temp: 98.4 F (36.9 C)  TempSrc: Oral  SpO2: 97%  Weight: 137 lb (62.1 kg)  Height: 5' 5"  (1.651 m)    Body mass index is 22.8 kg/m.   Constitutional: NAD, calm, comfortable Eyes: PERRL, lids and conjunctivae  normal ENMT: Mucous membranes are moist. Respiratory: clear to auscultation bilaterally, no wheezing, no crackles. Normal respiratory effort. No accessory muscle use.  Cardiovascular: Regular rate and rhythm, no murmurs / rubs / gallops. No extremity edema. 2+ pedal pulses. No carotid bruits.  Musculoskeletal: no clubbing / cyanosis. No joint deformity upper  and lower extremities. Good ROM, no contractures. Normal muscle tone.  Neurologic: CN 2-12 grossly intact. Sensation intact, DTR normal. Strength 5/5 in all 4.  Psychiatric: Normal judgment and insight. Alert and oriented x 3. Normal mood.    Impression and Plan:  Anxiety state Insomnia, unspecified type -I am willing to be the primary prescriber of her benzodiazepine. -Benzodiazepine contract will be done today.  Tobacco abuse -I have discussed tobacco cessation with the patient.  I have counseled the patient regarding the negative impacts of continued tobacco use including but not limited to lung cancer, COPD, and cardiovascular disease.  I have discussed alternatives to tobacco and modalities that may help facilitate tobacco cessation including but not limited to biofeedback, hypnosis, and medications.  Total time spent with tobacco counseling was 4 minutes. -I have recommended she schedule appointment with our onsite counselor, Mr. Dennison Bulla for cognitive behavioral therapy, not only as pertains to her depression and anxiety, but also for smoking cessation purposes.   Current mild episode of major depressive disorder, unspecified whether recurrent ([place]) -Well-controlled on Wellbutrin. -She would like a trial off Wellbutrin. -We will continue to discuss this at her follow-up appointment in 2 months.  Health maintenance issues -She will schedule return appointment in 2 months to discuss cancer screening, immunizations, routine dental and eye care and other health maintenance issues.      Patient Instructions  -It was nice meeting you today!  -Continue to think about smoking cessation. Highly recommend you schedule an appointment with Mr. Dennison Bulla, our on-site behavioral health counselor.  -Schedule follow up with me in 2 months for your annual physical. Please come fasting to that appointment.   Smoking Tobacco Information, Adult Smoking tobacco can be harmful to your  health. Tobacco contains a poisonous (toxic), colorless chemical called nicotine. Nicotine is addictive. It changes the brain and can make it hard to stop smoking. Tobacco also has other toxic chemicals that can hurt your body and raise your risk of many cancers. How can smoking tobacco affect me? Smoking tobacco puts you at risk for:  Cancer. Smoking is most commonly associated with lung cancer, but can also lead to cancer in other parts of the body.  Chronic obstructive pulmonary disease (COPD). This is a long-term lung condition that makes it hard to breathe. It also gets worse over time.  High blood pressure (hypertension), heart disease, stroke, or heart attack.  Lung infections, such as pneumonia.  Cataracts. This is when the lenses in the eyes become clouded.  Digestive problems. This may include peptic ulcers, heartburn, and gastroesophageal reflux disease (GERD).  Oral health problems, such as gum disease and tooth loss.  Loss of taste and smell. Smoking can affect your appearance by causing:  Wrinkles.  Yellow or stained teeth, fingers, and fingernails. Smoking tobacco can also affect your social life, because:  It may be challenging to find places to smoke when away from home. Many workplaces, [employer], hotels, and public places are tobacco-free.  Smoking is expensive. This is due to the cost of tobacco and the long-term costs of treating health problems from smoking.  Secondhand smoke may affect those around you. Secondhand smoke can cause  lung cancer, breathing problems, and heart disease. Children of smokers have a higher risk for: ? Sudden infant death syndrome (SIDS). ? Ear infections. ? Lung infections. If you currently smoke tobacco, quitting now can help you:  Lead a longer and healthier life.  Look, smell, breathe, and feel better over time.  Save money.  Protect others from the harms of secondhand smoke. What actions can I take to prevent health  problems? Quit smoking   Do not start smoking. Quit if you already do.  Make a plan to quit smoking and commit to it. Look for programs to help you and ask your health care provider for recommendations and ideas.  Set a date and write down all the reasons you want to quit.  Let your friends and family know you are quitting so they can help and support you. Consider finding friends who also want to quit. It can be easier to quit with someone else, so that you can support each other.  Talk with your health care provider about using nicotine replacement medicines to help you quit, such as gum, lozenges, patches, sprays, or pills.  Do not replace cigarette smoking with electronic cigarettes, which are commonly called e-cigarettes. The safety of e-cigarettes is not known, and some may contain harmful chemicals.  If you try to quit but return to smoking, stay positive. It is common to slip up when you first quit, so take it one day at a time.  Be prepared for cravings. When you feel the urge to smoke, chew gum or suck on hard candy. Lifestyle  Stay busy and take care of your body.  Drink enough fluid to keep your urine pale yellow.  Get plenty of exercise and eat a healthy diet. This can help prevent weight gain after quitting.  Monitor your eating habits. Quitting smoking can cause you to have a larger appetite than when you smoke.  Find ways to relax. Go out with friends or family to a movie or a restaurant where people do not smoke.  Ask your health care provider about having regular tests (screenings) to check for cancer. This may include blood tests, imaging tests, and other tests.  Find ways to manage your stress, such as meditation, yoga, or exercise. Where to find support To get support to quit smoking, consider:  Asking your health care provider for more information and resources.  Taking classes to learn more about quitting smoking.  Looking for local organizations that  offer resources about quitting smoking.  Joining a support group for people who want to quit smoking in your local community.  Calling the smokefree.gov counselor helpline: 1-800-Quit-Now (910) 466-4399) Where to find more information You may find more information about quitting smoking from:  HelpGuide.org: www.helpguide.org  https://hall.com/: smokefree.gov  American Lung Association: www.lung.org Contact a health care provider if you:  Have problems breathing.  Notice that your lips, nose, or fingers turn blue.  Have chest pain.  Are coughing up blood.  Feel faint or you pass out.  Have other health changes that cause you to worry. Summary  Smoking tobacco can negatively affect your health, the health of those around you, your finances, and your social life.  Do not start smoking. Quit if you already do. If you need help quitting, ask your health care provider.  Think about joining a support group for people who want to quit smoking in your local community. There are many effective programs that will help you to quit this behavior. This information  is not intended to replace advice given to you by your health care provider. Make sure you discuss any questions you have with your health care provider. Document Released: 08/26/2016 Document Revised: 09/30/2017 Document Reviewed: 08/26/2016 Elsevier Interactive Patient Education  2019 [place], [place] [facility]

## 2018-10-13 ENCOUNTER — Encounter: Payer: 59 | Admitting: Internal Medicine

## 2018-10-13 ENCOUNTER — Ambulatory Visit (INDEPENDENT_AMBULATORY_CARE_PROVIDER_SITE_OTHER): Payer: 59 | Admitting: Internal Medicine

## 2018-10-13 ENCOUNTER — Encounter: Payer: Self-pay | Admitting: Internal Medicine

## 2018-10-13 VITALS — BP 110/80 | HR 79 | Temp 98.2°F | Ht 65.0 in | Wt 138.7 lb

## 2018-10-13 DIAGNOSIS — Z0000 Encounter for general adult medical examination without abnormal findings: Secondary | ICD-10-CM

## 2018-10-13 DIAGNOSIS — Z720 Tobacco use: Secondary | ICD-10-CM

## 2018-10-13 DIAGNOSIS — R5383 Other fatigue: Secondary | ICD-10-CM | POA: Diagnosis not present

## 2018-10-13 DIAGNOSIS — F411 Generalized anxiety disorder: Secondary | ICD-10-CM

## 2018-10-13 DIAGNOSIS — Z114 Encounter for screening for human immunodeficiency virus [HIV]: Secondary | ICD-10-CM

## 2018-10-13 DIAGNOSIS — G4700 Insomnia, unspecified: Secondary | ICD-10-CM | POA: Diagnosis not present

## 2018-10-13 DIAGNOSIS — F320 Major depressive disorder, single episode, mild: Secondary | ICD-10-CM | POA: Diagnosis not present

## 2018-10-13 DIAGNOSIS — Z1231 Encounter for screening mammogram for malignant neoplasm of breast: Secondary | ICD-10-CM

## 2018-10-13 DIAGNOSIS — Z23 Encounter for immunization: Secondary | ICD-10-CM | POA: Diagnosis not present

## 2018-10-13 DIAGNOSIS — Z1159 Encounter for screening for other viral diseases: Secondary | ICD-10-CM

## 2018-10-13 DIAGNOSIS — C4490 Unspecified malignant neoplasm of skin, unspecified: Secondary | ICD-10-CM | POA: Insufficient documentation

## 2018-10-13 LAB — COMPREHENSIVE METABOLIC PANEL
ALBUMIN: 4.4 g/dL (ref 3.5–5.2)
ALT: 9 U/L (ref 0–35)
AST: 11 U/L (ref 0–37)
Alkaline Phosphatase: 56 U/L (ref 39–117)
BILIRUBIN TOTAL: 0.5 mg/dL (ref 0.2–1.2)
BUN: 21 mg/dL (ref 6–23)
CALCIUM: 9.1 mg/dL (ref 8.4–10.5)
CO2: 27 mEq/L (ref 19–32)
CREATININE: 0.88 mg/dL (ref 0.40–1.20)
Chloride: 107 mEq/L (ref 96–112)
GFR: 65.08 mL/min (ref >60.00)
Glucose, Bld: 87 mg/dL (ref 70–99)
Potassium: 4.4 mEq/L (ref 3.5–5.1)
SODIUM: 141 meq/L (ref 135–145)
TOTAL PROTEIN: 6.6 g/dL (ref 6.0–8.3)

## 2018-10-13 LAB — CBC WITH DIFFERENTIAL/PLATELET
BASOS PCT: 1 % (ref 0.0–3.0)
Basophils Absolute: 0.1 10*3/uL (ref 0.0–0.1)
EOS ABS: 0.1 10*3/uL (ref 0.0–0.7)
Eosinophils Relative: 1.1 % (ref 0.0–5.0)
HCT: 40.4 % (ref 36.0–46.0)
HEMOGLOBIN: 13.9 g/dL (ref 12.0–15.0)
LYMPHS PCT: 23.9 % (ref 12.0–46.0)
Lymphs Abs: 1.4 10*3/uL (ref 0.7–4.0)
MCHC: 34.4 g/dL (ref 30.0–36.0)
MCV: 89.2 fl (ref 78.0–100.0)
MONO ABS: 0.5 10*3/uL (ref 0.1–1.0)
Monocytes Relative: 9.1 % (ref 3.0–12.0)
Neutro Abs: 3.7 10*3/uL (ref 1.4–7.7)
Neutrophils Relative %: 64.9 % (ref 43.0–77.0)
Platelets: 188 10*3/uL (ref 150.0–400.0)
RBC: 4.53 Mil/uL (ref 3.87–5.11)
RDW: 13.3 % (ref 11.5–15.5)
WBC: 5.7 10*3/uL (ref 4.0–10.5)

## 2018-10-13 LAB — LIPID PANEL
CHOLESTEROL: 236 mg/dL — AB (ref 0–200)
HDL: 60.9 mg/dL (ref >39.00)
LDL Cholesterol: 163 mg/dL — ABNORMAL HIGH (ref 0–99)
NonHDL: 175.59
TRIGLYCERIDES: 61 mg/dL (ref 0.0–149.0)
Total CHOL/HDL Ratio: 4
VLDL: 12.2 mg/dL (ref 0.0–40.0)

## 2018-10-13 LAB — VITAMIN D 25 HYDROXY (VIT D DEFICIENCY, FRACTURES): VITD: 32.69 ng/mL (ref 30.00–100.00)

## 2018-10-13 LAB — VITAMIN B12: VITAMIN B 12: 372 pg/mL (ref 211–911)

## 2018-10-13 LAB — TSH: TSH: 1.16 u[IU]/mL (ref 0.35–4.50)

## 2018-10-13 NOTE — Progress Notes (Signed)
Established Patient Office Visit     CC/Reason for Visit: annual physical exam  HPI: [name] is a 63 y.o. female who is coming in today for the above mentioned reason.  Patient lives with her husband. Past Medical History is significant for anxiety, depression, tobacco abuse, skin cancer, colitis and insomnia.  Patient c/o memory issues, says she has a stressful job and feels like she's struggling at work.  We discussed increasing her wellbutrin dose and we will also draw labs today.  We will do a depression screening today.  Patient smokes 1/2 ppd.  We discussed smoking cessation today and she will notify us when she is ready to quit smoking.  Patient has problems falling asleep and takes xanax each night to sleep.  Patient sts she's had a colonoscopy a couple of years ago and will send [place] those results.  She goes to GYN in [facility] and is due for a pap and will send [place] her records.  She is due for a mammogram and will schedule that as soon as possible.  Patient sts she is UTD on dental and eye exams.  She has a yearly skin screening with derm next month.  She will receive her flu and shingles vaccine today.  We will do a one time screening for HIV and Hep C today.     Past Medical/Surgical History: Past Medical History:  Diagnosis Date  . Anxiety   . Colitis   . Depression   . Migraine   . Palpitations     Past Surgical History:  Procedure Laterality Date  . APPENDECTOMY Right   . TONSILLECTOMY      Social History:  reports that she has been smoking cigarettes. She has been smoking about 1.00 pack per day. She has never used smokeless tobacco. She reports current alcohol use. She reports that she does not use drugs.  Allergies: No Known Allergies  Family History:  Family History  Problem Relation Age of Onset  . Prostate cancer Father   . Hypertension Mother      Current Outpatient Medications:  .  ALPRAZolam (XANAX) 0.25 MG tablet, TAKE 1 TABLET BY MOUTH  TWICE A DAY AS NEEDED, Disp: 60 tablet, Rfl: 0 .  buPROPion (WELLBUTRIN XL) 150 MG 24 hr tablet, Take 1 tablet (150 mg total) by mouth daily., Disp: 90 tablet, Rfl: 4 .  Multiple Vitamin (MULTIVITAMIN) tablet, Take 1 tablet by mouth daily., Disp: , Rfl:   Review of Systems:  Constitutional: Denies fever, chills, diaphoresis, appetite change. C/o stress and fatigue at work  HEENT: Denies photophobia, eye pain, redness, hearing loss, ear pain, congestion, sore throat, rhinorrhea, sneezing, mouth sores, trouble swallowing, neck pain, neck stiffness and tinnitus.   Respiratory: Denies SOB, DOE, cough, chest tightness,  and wheezing.   Cardiovascular: Denies chest pain, palpitations and leg swelling.  Gastrointestinal: Denies nausea, vomiting, abdominal pain, diarrhea, constipation, blood in stool and abdominal distention.  Genitourinary: Denies dysuria, urgency, frequency, hematuria, flank pain and difficulty urinating.  Endocrine: Denies: hot or cold intolerance, sweats, changes in hair or nails, polyuria, polydipsia. Musculoskeletal: Denies myalgias, back pain, joint swelling, arthralgias and gait problem.  Skin: Denies pallor, rash and wound.  Neurological: Denies dizziness, seizures, syncope, weakness, light-headedness, numbness and headaches.  Hematological: Denies adenopathy. Easy bruising, personal or family bleeding history  Psychiatric/Behavioral: Denies suicidal ideation, mood changes, confusion, nervousness, sleep disturbance and agitation.     Physical Exam: Vitals:   10/13/18 0804  BP:  110/80  Pulse: 79  Temp: 98.2 F (36.8 C)  TempSrc: Oral  SpO2: 97%  Weight: 138 lb 11.2 oz (62.9 kg)  Height: _0  (1.651 m)    Body mass index is 23.08 kg/m.   Constitutional: NAD, calm, comfortable Eyes: PERRL, lids and conjunctivae normal ENMT: Mucous membranes are moist. Posterior pharynx clear of any exudate or lesions. Normal dentition. Tympanic membrane is pearly white, no  erythema or bulging.  Moderate amt of cerum to bilateral ears Neck: normal, supple, no masses, no thyromegaly Respiratory: clear to auscultation bilaterally, no wheezing, no crackles. Normal respiratory effort. No accessory muscle use.  Cardiovascular: Regular rate and rhythm, no murmurs / rubs / gallops. No extremity edema. 2+ pedal pulses.  Abdomen: no tenderness, no masses palpated. No hepatosplenomegaly. Bowel sounds positive.  Musculoskeletal: no clubbing / cyanosis. No joint deformity upper and lower extremities. Good ROM, no contractures. Normal muscle tone.  Skin: no rashes, lesions, ulcers. No induration Neurologic: CN 2-12 grossly intact. Sensation intact, DTR normal. Strength 5/5 in all 4.  Psychiatric: Normal judgment and insight. Alert and oriented x 3. Normal mood.    Impression and Plan:  Encounter for preventive health examination -   -Labs today. -Immunizations will be updated: flu and shingles today, had tetanus at last visit. -Mammogram to be scheduled. Pap thru GYN and she will schedule/ States she had a colonoscopy 2 years ago, will give [place] report.  Tobacco abuse -  -we have again discussed cessation. She is not ready to quit yet.  Insomnia, unspecified type -discussed sleep hygiene -on xanax 0.25 mg qhs.  Anxiety state/depression -discussed increasing wellbutrin at next visit depending on lab work results  Current mild episode of major depressive disorder, unspecified whether recurrent ([place]) -  -scored 2 on PHQ 9. -May increase wellbutrin pending results of lab work.  Encounter for screening for HIV -   -HIV antibody (with reflex)  Encounter for hepatitis C screening test for low risk patient -   -Hep C Antibody  Fatigue, unspecified type -   -if lab work normal, consider increasing wellutrin.  Skin cancer hx -appointment with derm next month for annual screening    Patient Instructions  Great seeing you this morning.  Instructions: -blood work  drawn today, we will notify you with the results -you received flu and shingles vaccines today -refer for mammogram -check in with GYN and GI and send records of colonoscopy and pap smear to office please   Preventive Care 40-64 Years, Female Preventive care refers to lifestyle choices and visits with your health care provider that can promote health and wellness. What does preventive care include?   A yearly physical exam. This is also called an annual well check.  Dental exams once or twice a year.  Routine eye exams. Ask your health care provider how often you should have your eyes checked.  Personal lifestyle choices, including: ? Daily care of your teeth and gums. ? Regular physical activity. ? Eating a healthy diet. ? Avoiding tobacco and drug use. ? Limiting alcohol use. ? Practicing safe sex. ? Taking low-dose aspirin daily starting at age 50. ? Taking vitamin and mineral supplements as recommended by your health care provider. What happens during an annual well check? The services and screenings done by your health care provider during your annual well check will depend on your age, overall health, lifestyle risk factors, and family history of disease. Counseling Your health care provider may ask you questions about your:  Alcohol use.  Tobacco use.  Drug use.  Emotional well-being.  Home and relationship well-being.  Sexual activity.  Eating habits.  Work and work Statistician.  Method of birth control.  Menstrual cycle.  Pregnancy history. Screening You may have the following tests or measurements:  Height, weight, and BMI.  Blood pressure.  Lipid and cholesterol levels. These may be checked every 5 years, or more frequently if you are over 36 years old.  Skin check.  Lung cancer screening. You may have this screening every year starting at age 71 if you have a 30-pack-year history of smoking and currently smoke or have quit within the past 15  years.  Colorectal cancer screening. All adults should have this screening starting at age 1 and continuing until age 43. Your health care provider may recommend screening at age 72. You will have tests every 1-10 years, depending on your results and the type of screening test. People at increased risk should start screening at an earlier age. Screening tests may include: ? Guaiac-based fecal occult blood testing. ? Fecal immunochemical test (FIT). ? Stool DNA test. ? Virtual colonoscopy. ? Sigmoidoscopy. During this test, a flexible tube with a tiny camera (sigmoidoscope) is used to examine your rectum and lower colon. The sigmoidoscope is inserted through your anus into your rectum and lower colon. ? Colonoscopy. During this test, a long, thin, flexible tube with a tiny camera (colonoscope) is used to examine your entire colon and rectum.  Hepatitis C blood test.  Hepatitis B blood test.  Sexually transmitted disease (STD) testing.  Diabetes screening. This is done by checking your blood sugar (glucose) after you have not eaten for a while (fasting). You may have this done every 1-3 years.  Mammogram. This may be done every 1-2 years. Talk to your health care provider about when you should start having regular mammograms. This may depend on whether you have a family history of breast cancer.  BRCA-related cancer screening. This may be done if you have a family history of breast, ovarian, tubal, or peritoneal cancers.  Pelvic exam and Pap test. This may be done every 3 years starting at age 18. Starting at age 98, this may be done every 5 years if you have a Pap test in combination with an HPV test.  Bone density scan. This is done to screen for osteoporosis. You may have this scan if you are at high risk for osteoporosis. Discuss your test results, treatment options, and if necessary, the need for more tests with your health care provider. Vaccines Your health care provider may  recommend certain vaccines, such as:  Influenza vaccine. This is recommended every year.  Tetanus, diphtheria, and acellular pertussis (Tdap, Td) vaccine. You may need a Td booster every 10 years.  Varicella vaccine. You may need this if you have not been vaccinated.  Zoster vaccine. You may need this after age 11.  Measles, mumps, and rubella (MMR) vaccine. You may need at least one dose of MMR if you were born in 1957 or later. You may also need a second dose.  Pneumococcal 13-valent conjugate (PCV13) vaccine. You may need this if you have certain conditions and were not previously vaccinated.  Pneumococcal polysaccharide (PPSV23) vaccine. You may need one or two doses if you smoke cigarettes or if you have certain conditions.  Meningococcal vaccine. You may need this if you have certain conditions.  Hepatitis A vaccine. You may need this if you have certain conditions or if you  travel or work in places where you may be exposed to hepatitis A.  Hepatitis B vaccine. You may need this if you have certain conditions or if you travel or work in places where you may be exposed to hepatitis B.  Haemophilus influenzae type b (Hib) vaccine. You may need this if you have certain conditions. Talk to your health care provider about which screenings and vaccines you need and how often you need them. This information is not intended to replace advice given to you by your health care provider. Make sure you discuss any questions you have with your health care provider. Document Released: 09/07/2015 Document Revised: 10/01/2017 Document Reviewed: 06/12/2015 Elsevier Interactive Patient Education  2019 [employer].  Smoking Tobacco Information, Adult Smoking tobacco can be harmful to your health. Tobacco contains a poisonous (toxic), colorless chemical called nicotine. Nicotine is addictive. It changes the brain and can make it hard to stop smoking. Tobacco also has other toxic chemicals that can  hurt your body and raise your risk of many cancers. How can smoking tobacco affect me? Smoking tobacco puts you at risk for: Cancer. Smoking is most commonly associated with lung cancer, but can also lead to cancer in other parts of the body. Chronic obstructive pulmonary disease (COPD). This is a long-term lung condition that makes it hard to breathe. It also gets worse over time. High blood pressure (hypertension), heart disease, stroke, or heart attack. Lung infections, such as pneumonia. Cataracts. This is when the lenses in the eyes become clouded. Digestive problems. This may include peptic ulcers, heartburn, and gastroesophageal reflux disease (GERD). Oral health problems, such as gum disease and tooth loss. Loss of taste and smell. Smoking can affect your appearance by causing: Wrinkles. Yellow or stained teeth, fingers, and fingernails. Smoking tobacco can also affect your social life, because: It may be challenging to find places to smoke when away from home. Many workplaces, [employer], hotels, and public places are tobacco-free. Smoking is expensive. This is due to the cost of tobacco and the long-term costs of treating health problems from smoking. Secondhand smoke may affect those around you. Secondhand smoke can cause lung cancer, breathing problems, and heart disease. Children of smokers have a higher risk for: Sudden infant death syndrome (SIDS). Ear infections. Lung infections. If you currently smoke tobacco, quitting now can help you: Lead a longer and healthier life. Look, smell, breathe, and feel better over time. Save money. Protect others from the harms of secondhand smoke. What actions can I take to prevent health problems? Quit smoking  Do not start smoking. Quit if you already do. Make a plan to quit smoking and commit to it. Look for programs to help you and ask your health care provider for recommendations and ideas. Set a date and write down all the  reasons you want to quit. Let your friends and family know you are quitting so they can help and support you. Consider finding friends who also want to quit. It can be easier to quit with someone else, so that you can support each other. Talk with your health care provider about using nicotine replacement medicines to help you quit, such as gum, lozenges, patches, sprays, or pills. Do not replace cigarette smoking with electronic cigarettes, which are commonly called e-cigarettes. The safety of e-cigarettes is not known, and some may contain harmful chemicals. If you try to quit but return to smoking, stay positive. It is common to slip up when you first quit, so take it  one day at a time. Be prepared for cravings. When you feel the urge to smoke, chew gum or suck on hard candy. Lifestyle Stay busy and take care of your body. Drink enough fluid to keep your urine pale yellow. Get plenty of exercise and eat a healthy diet. This can help prevent weight gain after quitting. Monitor your eating habits. Quitting smoking can cause you to have a larger appetite than when you smoke. Find ways to relax. Go out with friends or family to a movie or a restaurant where people do not smoke. Ask your health care provider about having regular tests (screenings) to check for cancer. This may include blood tests, imaging tests, and other tests. Find ways to manage your stress, such as meditation, yoga, or exercise. Where to find support To get support to quit smoking, consider: Asking your health care provider for more information and resources. Taking classes to learn more about quitting smoking. Looking for local organizations that offer resources about quitting smoking. Joining a support group for people who want to quit smoking in your local community. Calling the smokefree.gov counselor helpline: 1-800-Quit-Now (502) 843-0667) Where to find more information You may find more information about quitting  smoking from: HelpGuide.org: www.helpguide.org https://hall.com/: smokefree.gov American Lung Association: www.lung.org Contact a health care provider if you: Have problems breathing. Notice that your lips, nose, or fingers turn blue. Have chest pain. Are coughing up blood. Feel faint or you pass out. Have other health changes that cause you to worry. Summary Smoking tobacco can negatively affect your health, the health of those around you, your finances, and your social life. Do not start smoking. Quit if you already do. If you need help quitting, ask your health care provider. Think about joining a support group for people who want to quit smoking in your local community. There are many effective programs that will help you to quit this behavior. This information is not intended to replace advice given to you by your health care provider. Make sure you discuss any questions you have with your health care provider. Document Released: 08/26/2016 Document Revised: 09/30/2017 Document Reviewed: 08/26/2016 Elsevier Interactive Patient Education  2019 [place], RN DNP Student [facility]

## 2018-10-13 NOTE — Patient Instructions (Addendum)
Great seeing you this morning.  Instructions: -blood work drawn today, we will notify you with the results -you received flu and shingles vaccines today -refer for mammogram -check in with GYN and GI and send records of colonoscopy and pap smear to office please   Preventive Care 40-64 Years, Female Preventive care refers to lifestyle choices and visits with your health care provider that can promote health and wellness. What does preventive care include?   A yearly physical exam. This is also called an annual well check.  Dental exams once or twice a year.  Routine eye exams. Ask your health care provider how often you should have your eyes checked.  Personal lifestyle choices, including: ? Daily care of your teeth and gums. ? Regular physical activity. ? Eating a healthy diet. ? Avoiding tobacco and drug use. ? Limiting alcohol use. ? Practicing safe sex. ? Taking low-dose aspirin daily starting at age 70. ? Taking vitamin and mineral supplements as recommended by your health care provider. What happens during an annual well check? The services and screenings done by your health care provider during your annual well check will depend on your age, overall health, lifestyle risk factors, and family history of disease. Counseling Your health care provider may ask you questions about your:  Alcohol use.  Tobacco use.  Drug use.  Emotional well-being.  Home and relationship well-being.  Sexual activity.  Eating habits.  Work and work Statistician.  Method of birth control.  Menstrual cycle.  Pregnancy history. Screening You may have the following tests or measurements:  Height, weight, and BMI.  Blood pressure.  Lipid and cholesterol levels. These may be checked every 5 years, or more frequently if you are over 22 years old.  Skin check.  Lung cancer screening. You may have this screening every year starting at age 64 if you have a 30-pack-year history of  smoking and currently smoke or have quit within the past 15 years.  Colorectal cancer screening. All adults should have this screening starting at age 54 and continuing until age 35. Your health care provider may recommend screening at age 57. You will have tests every 1-10 years, depending on your results and the type of screening test. People at increased risk should start screening at an earlier age. Screening tests may include: ? Guaiac-based fecal occult blood testing. ? Fecal immunochemical test (FIT). ? Stool DNA test. ? Virtual colonoscopy. ? Sigmoidoscopy. During this test, a flexible tube with a tiny camera (sigmoidoscope) is used to examine your rectum and lower colon. The sigmoidoscope is inserted through your anus into your rectum and lower colon. ? Colonoscopy. During this test, a long, thin, flexible tube with a tiny camera (colonoscope) is used to examine your entire colon and rectum.  Hepatitis C blood test.  Hepatitis B blood test.  Sexually transmitted disease (STD) testing.  Diabetes screening. This is done by checking your blood sugar (glucose) after you have not eaten for a while (fasting). You may have this done every 1-3 years.  Mammogram. This may be done every 1-2 years. Talk to your health care provider about when you should start having regular mammograms. This may depend on whether you have a family history of breast cancer.  BRCA-related cancer screening. This may be done if you have a family history of breast, ovarian, tubal, or peritoneal cancers.  Pelvic exam and Pap test. This may be done every 3 years starting at age 19. Starting at age 1, this 24  be done every 5 years if you have a Pap test in combination with an HPV test.  Bone density scan. This is done to screen for osteoporosis. You may have this scan if you are at high risk for osteoporosis. Discuss your test results, treatment options, and if necessary, the need for more tests with your health care  provider. Vaccines Your health care provider may recommend certain vaccines, such as:  Influenza vaccine. This is recommended every year.  Tetanus, diphtheria, and acellular pertussis (Tdap, Td) vaccine. You may need a Td booster every 10 years.  Varicella vaccine. You may need this if you have not been vaccinated.  Zoster vaccine. You may need this after age 60.  Measles, mumps, and rubella (MMR) vaccine. You may need at least one dose of MMR if you were born in 1957 or later. You may also need a second dose.  Pneumococcal 13-valent conjugate (PCV13) vaccine. You may need this if you have certain conditions and were not previously vaccinated.  Pneumococcal polysaccharide (PPSV23) vaccine. You may need one or two doses if you smoke cigarettes or if you have certain conditions.  Meningococcal vaccine. You may need this if you have certain conditions.  Hepatitis A vaccine. You may need this if you have certain conditions or if you travel or work in places where you may be exposed to hepatitis A.  Hepatitis B vaccine. You may need this if you have certain conditions or if you travel or work in places where you may be exposed to hepatitis B.  Haemophilus influenzae type b (Hib) vaccine. You may need this if you have certain conditions. Talk to your health care provider about which screenings and vaccines you need and how often you need them. This information is not intended to replace advice given to you by your health care provider. Make sure you discuss any questions you have with your health care provider. Document Released: 09/07/2015 Document Revised: 10/01/2017 Document Reviewed: 06/12/2015 Elsevier Interactive Patient Education  2019 [employer] Inc.  Smoking Tobacco Information, Adult Smoking tobacco can be harmful to your health. Tobacco contains a poisonous (toxic), colorless chemical called nicotine. Nicotine is addictive. It changes the brain and can make it hard to stop smoking.  Tobacco also has other toxic chemicals that can hurt your body and raise your risk of many cancers. How can smoking tobacco affect me? Smoking tobacco puts you at risk for: Cancer. Smoking is most commonly associated with lung cancer, but can also lead to cancer in other parts of the body. Chronic obstructive pulmonary disease (COPD). This is a long-term lung condition that makes it hard to breathe. It also gets worse over time. High blood pressure (hypertension), heart disease, stroke, or heart attack. Lung infections, such as pneumonia. Cataracts. This is when the lenses in the eyes become clouded. Digestive problems. This may include peptic ulcers, heartburn, and gastroesophageal reflux disease (GERD). Oral health problems, such as gum disease and tooth loss. Loss of taste and smell. Smoking can affect your appearance by causing: Wrinkles. Yellow or stained teeth, fingers, and fingernails. Smoking tobacco can also affect your social life, because: It may be challenging to find places to smoke when away from home. Many workplaces, restaurants, hotels, and public places are tobacco-free. Smoking is expensive. This is due to the cost of tobacco and the long-term costs of treating health problems from smoking. Secondhand smoke may affect those around you. Secondhand smoke can cause lung cancer, breathing problems, and heart disease. Children of smokers   have a higher risk for: Sudden infant death syndrome (SIDS). Ear infections. Lung infections. If you currently smoke tobacco, quitting now can help you: Lead a longer and healthier life. Look, smell, breathe, and feel better over time. Save money. Protect others from the harms of secondhand smoke. What actions can I take to prevent health problems? Quit smoking  Do not start smoking. Quit if you already do. Make a plan to quit smoking and commit to it. Look for programs to help you and ask your health care provider for recommendations and  ideas. Set a date and write down all the reasons you want to quit. Let your friends and family know you are quitting so they can help and support you. Consider finding friends who also want to quit. It can be easier to quit with someone else, so that you can support each other. Talk with your health care provider about using nicotine replacement medicines to help you quit, such as gum, lozenges, patches, sprays, or pills. Do not replace cigarette smoking with electronic cigarettes, which are commonly called e-cigarettes. The safety of e-cigarettes is not known, and some may contain harmful chemicals. If you try to quit but return to smoking, stay positive. It is common to slip up when you first quit, so take it one day at a time. Be prepared for cravings. When you feel the urge to smoke, chew gum or suck on hard candy. Lifestyle Stay busy and take care of your body. Drink enough fluid to keep your urine pale yellow. Get plenty of exercise and eat a healthy diet. This can help prevent weight gain after quitting. Monitor your eating habits. Quitting smoking can cause you to have a larger appetite than when you smoke. Find ways to relax. Go out with friends or family to a movie or a restaurant where people do not smoke. Ask your health care provider about having regular tests (screenings) to check for cancer. This may include blood tests, imaging tests, and other tests. Find ways to manage your stress, such as meditation, yoga, or exercise. Where to find support To get support to quit smoking, consider: Asking your health care provider for more information and resources. Taking classes to learn more about quitting smoking. Looking for local organizations that offer resources about quitting smoking. Joining a support group for people who want to quit smoking in your local community. Calling the smokefree.gov counselor helpline: 1-800-Quit-Now 959-166-4932) Where to find more information You may  find more information about quitting smoking from: HelpGuide.org: www.helpguide.org https://hall.com/: smokefree.gov American Lung Association: www.lung.org Contact a health care provider if you: Have problems breathing. Notice that your lips, nose, or fingers turn blue. Have chest pain. Are coughing up blood. Feel faint or you pass out. Have other health changes that cause you to worry. Summary Smoking tobacco can negatively affect your health, the health of those around you, your finances, and your social life. Do not start smoking. Quit if you already do. If you need help quitting, ask your health care provider. Think about joining a support group for people who want to quit smoking in your local community. There are many effective programs that will help you to quit this behavior. This information is not intended to replace advice given to you by your health care provider. Make sure you discuss any questions you have with your health care provider. Document Released: 08/26/2016 Document Revised: 09/30/2017 Document Reviewed: 08/26/2016 Elsevier Interactive Patient Education  2019 [employer].

## 2018-10-14 LAB — HIV ANTIBODY (ROUTINE TESTING W REFLEX): HIV: NONREACTIVE

## 2018-10-14 LAB — HEPATITIS C ANTIBODY
HEP C AB: NONREACTIVE
SIGNAL TO CUT-OFF: 0.01 (ref <1.00)

## 2018-10-19 ENCOUNTER — Encounter: Payer: 59 | Admitting: Internal Medicine

## 2018-10-20 ENCOUNTER — Other Ambulatory Visit: Payer: Self-pay | Admitting: Internal Medicine

## 2018-10-20 NOTE — Telephone Encounter (Signed)
Copied from [place] [id number]. Topic: Quick Communication - Rx Refill/Question >> Oct 20, 2018  9:11 AM Alanda Slim E wrote: Medication: ALPRAZolam Duanne Moron) 0.25 MG tablet - Pt was told there were refills on the Rx but the pharmacy advised her that Rx has no refills. Pt asked that you Please include refills on the Rx so Pt does not have to call in for refills every 30 days if possible  Has the patient contacted their pharmacy? Yes - Pharmacy sent a request   Preferred Pharmacy (with phone number or street name): [id number] [place], [address] [phone](Phone) [phone](Fax)    Agent: Please be advised that RX refills may take up to 3 business days. We ask that you follow-up with your pharmacy.

## 2019-01-20 ENCOUNTER — Other Ambulatory Visit: Payer: Self-pay | Admitting: Internal Medicine

## 2019-03-08 ENCOUNTER — Telehealth: Payer: Self-pay | Admitting: Internal Medicine

## 2019-03-08 MED ORDER — BUPROPION HCL ER (XL) 150 MG PO TB24: 150.0000 mg | ORAL_TABLET | Freq: Every day | ORAL | 1 refills | Status: DC

## 2019-03-08 NOTE — Telephone Encounter (Signed)
Medication Refill - Medication: buPROPion (WELLBUTRIN XL) 150 MG 24 hr tablet    Has the patient contacted their pharmacy? Yes.  Pt states pharmacy has been trying to get in contact. PT states she only has one more pill. Please advise.  (Agent: If no, request that the patient contact the pharmacy for the refill.) (Agent: If yes, when and what did the pharmacy advise?)  Preferred Pharmacy (with phone number or street name):  [id number] [place], [address]  [address] [address] Phone: [phone]: [phone] Not a 24 hour pharmacy; exact hours not known.     Agent: Please be advised that RX refills may take up to 3 business days. We ask that you follow-up with your pharmacy.

## 2019-03-08 NOTE — Telephone Encounter (Signed)
Refill sent.

## 2019-04-18 ENCOUNTER — Other Ambulatory Visit: Payer: Self-pay | Admitting: Internal Medicine

## 2019-06-24 ENCOUNTER — Other Ambulatory Visit: Payer: Self-pay | Admitting: Internal Medicine

## 2019-07-04 ENCOUNTER — Telehealth (INDEPENDENT_AMBULATORY_CARE_PROVIDER_SITE_OTHER): Payer: 59 | Admitting: Family Medicine

## 2019-07-04 ENCOUNTER — Other Ambulatory Visit: Payer: Self-pay

## 2019-07-04 DIAGNOSIS — T304 Corrosion of unspecified body region, unspecified degree: Secondary | ICD-10-CM | POA: Diagnosis not present

## 2019-07-04 DIAGNOSIS — L299 Pruritus, unspecified: Secondary | ICD-10-CM

## 2019-07-04 NOTE — Progress Notes (Signed)
Virtual Visit via Video Note  I connected with [name] on 07/04/19 at 10:00 AM EST by a video enabled telemedicine application 2/2 [id number] pandemic and verified that I am speaking with the correct person using two identifiers.  Location patient: home Location provider:work or home office Persons participating in the virtual visit: patient, provider  I discussed the limitations of evaluation and management by telemedicine and the availability of in person appointments. The patient expressed understanding and agreed to proceed.   HPI: Pt started using No 7 retinol 2.5% OTC cream daily.  Pt states her skin became red, peeling, itchy, feels hot, burning.  Pt has tried cool water, gentle cleanser.  The itching is what is bothering her the most.  Pt states she later read the package and realized she may have overused the cream.   ROS: See pertinent positives and negatives per HPI.  Past Medical History:  Diagnosis Date  . Anxiety   . Colitis   . Depression   . Migraine   . Palpitations     Past Surgical History:  Procedure Laterality Date  . APPENDECTOMY Right   . TONSILLECTOMY      Family History  Problem Relation Age of Onset  . Prostate cancer Father   . Hypertension Mother       Current Outpatient Medications:  .  ALPRAZolam (XANAX) 0.25 MG tablet, TAKE 1 TABLET BY MOUTH TWICE A DAY AS NEEDED, Disp: 30 tablet, Rfl: 2 .  buPROPion (WELLBUTRIN XL) 150 MG 24 hr tablet, TAKE 1 TABLET BY MOUTH EVERY DAY, Disp: 90 tablet, Rfl: 1 .  Multiple Vitamin (MULTIVITAMIN) tablet, Take 1 tablet by mouth daily., Disp: , Rfl:   EXAM:  VITALS per patient if applicable: RR between 11-94 bom  GENERAL: alert, oriented, appears well and in no acute distress  HEENT: atraumatic, conjunctiva clear, no obvious abnormalities on inspection of external nose and ears  NECK: normal movements of the head and neck  SKIN: face with dry, peeling skin.  No erythema or edema noted.  LUNGS: on  inspection no signs of respiratory distress, breathing rate appears normal, no obvious gross SOB, gasping or wheezing  CV: no obvious cyanosis  MS: moves all visible extremities without noticeable abnormality  PSYCH/NEURO: pleasant and cooperative, no obvious depression or anxiety, speech and thought processing grossly intact  ASSESSMENT AND PLAN:  Discussed the following assessment and plan:  Chemical burn of skin -Pt advised to d/c use of Retinol 2.5% cream.  In the future spot test skin when using a new product and use as directed. -Cortisone 1% cream BID on face x 5-7 days -ok to use po benadryl with caution -consider aloe vera gel to soothe skin. -avoid scratching/touching face -given precautions  Pruritis -avoid touching face -ok to use cool compress  F/u prn   I discussed the assessment and treatment plan with the patient. The patient was provided an opportunity to ask questions and all were answered. The patient agreed with the plan and demonstrated an understanding of the instructions.   The patient was advised to call back or seek an in-person evaluation if the symptoms worsen or if the condition fails to improve as anticipated.   Billie Ruddy, MD

## 2019-07-10 ENCOUNTER — Other Ambulatory Visit: Payer: Self-pay | Admitting: Internal Medicine

## 2019-07-12 NOTE — Telephone Encounter (Signed)
Last office visit 10/13/2018 Last refill 04/19/2019

## 2019-08-09 ENCOUNTER — Encounter: Payer: Self-pay | Admitting: Physician Assistant

## 2019-08-10 ENCOUNTER — Telehealth: Payer: Self-pay | Admitting: Gastroenterology

## 2019-08-10 NOTE — Telephone Encounter (Signed)
The pt was seen and diagnosed in [facility] ED with Colitis.  She was given cipro and flagyl to take however she stopped the abx because she was worried it was causing rectal bleeding.  The pt was advised to take the abx as prescribed and her appt with Ellouise Newer was changed to [facility] for 12/18.  She will go to the ED if bleeding or pain worsen.

## 2019-08-11 ENCOUNTER — Observation Stay (HOSPITAL_COMMUNITY): Payer: Managed Care, Other (non HMO)

## 2019-08-11 ENCOUNTER — Inpatient Hospital Stay (HOSPITAL_COMMUNITY)
Admission: EM | Admit: 2019-08-11 | Discharge: 2019-08-18 | DRG: 387 | Disposition: A | Discharge status: Home / Self Care | Payer: Managed Care, Other (non HMO) | Attending: Internal Medicine | Admitting: Internal Medicine

## 2019-08-11 ENCOUNTER — Other Ambulatory Visit: Payer: Self-pay

## 2019-08-11 ENCOUNTER — Encounter (HOSPITAL_COMMUNITY): Payer: Self-pay

## 2019-08-11 DIAGNOSIS — R109 Unspecified abdominal pain: Secondary | ICD-10-CM

## 2019-08-11 DIAGNOSIS — K8020 Calculus of gallbladder without cholecystitis without obstruction: Secondary | ICD-10-CM

## 2019-08-11 DIAGNOSIS — E876 Hypokalemia: Secondary | ICD-10-CM | POA: Diagnosis present

## 2019-08-11 DIAGNOSIS — K2980 Duodenitis without bleeding: Secondary | ICD-10-CM | POA: Diagnosis present

## 2019-08-11 DIAGNOSIS — Z8249 Family history of ischemic heart disease and other diseases of the circulatory system: Secondary | ICD-10-CM

## 2019-08-11 DIAGNOSIS — G4700 Insomnia, unspecified: Secondary | ICD-10-CM | POA: Diagnosis present

## 2019-08-11 DIAGNOSIS — K529 Noninfective gastroenteritis and colitis, unspecified: Secondary | ICD-10-CM | POA: Diagnosis present

## 2019-08-11 DIAGNOSIS — K5190 Ulcerative colitis, unspecified, without complications: Secondary | ICD-10-CM | POA: Diagnosis not present

## 2019-08-11 DIAGNOSIS — Z20828 Contact with and (suspected) exposure to other viral communicable diseases: Secondary | ICD-10-CM | POA: Diagnosis present

## 2019-08-11 DIAGNOSIS — K859 Acute pancreatitis without necrosis or infection, unspecified: Secondary | ICD-10-CM

## 2019-08-11 DIAGNOSIS — Z85828 Personal history of other malignant neoplasm of skin: Secondary | ICD-10-CM

## 2019-08-11 DIAGNOSIS — K635 Polyp of colon: Secondary | ICD-10-CM | POA: Diagnosis present

## 2019-08-11 DIAGNOSIS — F418 Other specified anxiety disorders: Secondary | ICD-10-CM | POA: Diagnosis present

## 2019-08-11 DIAGNOSIS — R7982 Elevated C-reactive protein (CRP): Secondary | ICD-10-CM | POA: Diagnosis present

## 2019-08-11 DIAGNOSIS — Z79899 Other long term (current) drug therapy: Secondary | ICD-10-CM

## 2019-08-11 DIAGNOSIS — R748 Abnormal levels of other serum enzymes: Secondary | ICD-10-CM

## 2019-08-11 DIAGNOSIS — E860 Dehydration: Secondary | ICD-10-CM | POA: Diagnosis present

## 2019-08-11 DIAGNOSIS — F17210 Nicotine dependence, cigarettes, uncomplicated: Secondary | ICD-10-CM | POA: Diagnosis present

## 2019-08-11 DIAGNOSIS — K625 Hemorrhage of anus and rectum: Secondary | ICD-10-CM

## 2019-08-11 HISTORY — DX: Acute pancreatitis without necrosis or infection, unspecified: K85.90

## 2019-08-11 LAB — COMPREHENSIVE METABOLIC PANEL
ALT: 19 U/L (ref 0–44)
AST: 18 U/L (ref 15–41)
Albumin: 3.5 g/dL (ref 3.5–5.0)
Alkaline Phosphatase: 69 U/L (ref 38–126)
Anion gap: 13 (ref 5–15)
BUN: 9 mg/dL (ref 8–23)
CO2: 22 mmol/L (ref 22–32)
Calcium: 8.2 mg/dL — ABNORMAL LOW (ref 8.9–10.3)
Chloride: 102 mmol/L (ref 98–111)
Creatinine, Ser: 0.72 mg/dL (ref 0.44–1.00)
GFR calc Af Amer: >60 mL/min (ref >60)
GFR calc non Af Amer: >60 mL/min (ref >60)
Glucose, Bld: 86 mg/dL (ref 70–99)
Potassium: 3.4 mmol/L — ABNORMAL LOW (ref 3.5–5.1)
Sodium: 137 mmol/L (ref 135–145)
Total Bilirubin: 1 mg/dL (ref 0.3–1.2)
Total Protein: 6.5 g/dL (ref 6.5–8.1)

## 2019-08-11 LAB — CBC
HCT: 39.7 % (ref 36.0–46.0)
Hemoglobin: 13 g/dL (ref 12.0–15.0)
MCH: 29.7 pg (ref 26.0–34.0)
MCHC: 32.7 g/dL (ref 30.0–36.0)
MCV: 90.6 fL (ref 80.0–100.0)
Platelets: 220 10*3/uL (ref 150–400)
RBC: 4.38 MIL/uL (ref 3.87–5.11)
RDW: 13.6 % (ref 11.5–15.5)
WBC: 6.9 10*3/uL (ref 4.0–10.5)
nRBC: 0 % (ref 0.0–0.2)

## 2019-08-11 LAB — URINALYSIS, ROUTINE W REFLEX MICROSCOPIC
Bacteria, UA: NONE SEEN
Bilirubin Urine: NEGATIVE
Glucose, UA: NEGATIVE mg/dL
Ketones, ur: 80 mg/dL — AB
Nitrite: NEGATIVE
Protein, ur: NEGATIVE mg/dL
Specific Gravity, Urine: 1.017 (ref 1.005–1.030)
pH: 5 (ref 5.0–8.0)

## 2019-08-11 LAB — LIPASE, BLOOD: Lipase: 104 U/L — ABNORMAL HIGH (ref 11–51)

## 2019-08-11 LAB — TYPE AND SCREEN
ABO/RH(D): A POS
Antibody Screen: NEGATIVE

## 2019-08-11 LAB — ABO/RH: ABO/RH(D): A POS

## 2019-08-11 LAB — SARS CORONAVIRUS 2 (TAT 6-24 HRS): SARS Coronavirus 2: NEGATIVE

## 2019-08-11 MED ORDER — NICOTINE 14 MG/24HR TD PT24
14.0000 mg | MEDICATED_PATCH | Freq: Every day | TRANSDERMAL | Status: DC
  Administered 2019-08-11: 14 mg via TRANSDERMAL
  Filled 2019-08-11 (×5): qty 1

## 2019-08-11 MED ORDER — FENTANYL CITRATE (PF) 100 MCG/2ML IJ SOLN
50.0000 ug | INTRAMUSCULAR | Status: DC | PRN
  Administered 2019-08-11 – 2019-08-12 (×7): 50 ug via INTRAVENOUS
  Filled 2019-08-11 (×7): qty 2

## 2019-08-11 MED ORDER — POTASSIUM CHLORIDE IN NACL 20-0.9 MEQ/L-% IV SOLN
INTRAVENOUS | Status: DC
  Filled 2019-08-11 (×13): qty 1000

## 2019-08-11 MED ORDER — ONDANSETRON HCL 4 MG/2ML IJ SOLN
4.0000 mg | Freq: Four times a day (QID) | INTRAMUSCULAR | Status: DC | PRN
  Administered 2019-08-11 – 2019-08-13 (×5): 4 mg via INTRAVENOUS
  Filled 2019-08-11 (×6): qty 2

## 2019-08-11 MED ORDER — ONDANSETRON HCL 4 MG PO TABS: 4.0000 mg | ORAL_TABLET | Freq: Four times a day (QID) | ORAL | Status: DC | PRN

## 2019-08-11 MED ORDER — ONDANSETRON HCL 4 MG/2ML IJ SOLN
4.0000 mg | Freq: Once | INTRAMUSCULAR | Status: AC
  Administered 2019-08-11: 4 mg via INTRAVENOUS
  Filled 2019-08-11: qty 2

## 2019-08-11 MED ORDER — CIPROFLOXACIN IN D5W 400 MG/200ML IV SOLN
400.0000 mg | Freq: Two times a day (BID) | INTRAVENOUS | Status: DC
  Administered 2019-08-11 – 2019-08-17 (×11): 400 mg via INTRAVENOUS
  Filled 2019-08-11 (×11): qty 200

## 2019-08-11 MED ORDER — FENTANYL CITRATE (PF) 100 MCG/2ML IJ SOLN
25.0000 ug | Freq: Once | INTRAMUSCULAR | Status: AC
  Administered 2019-08-11: 25 ug via INTRAVENOUS
  Filled 2019-08-11: qty 2

## 2019-08-11 MED ORDER — SODIUM CHLORIDE 0.9 % IV BOLUS
1000.0000 mL | Freq: Once | INTRAVENOUS | Status: AC
  Administered 2019-08-11: 1000 mL via INTRAVENOUS

## 2019-08-11 MED ORDER — BUPROPION HCL ER (XL) 150 MG PO TB24
150.0000 mg | ORAL_TABLET | Freq: Every day | ORAL | Status: DC
  Administered 2019-08-11 – 2019-08-17 (×7): 150 mg via ORAL
  Filled 2019-08-11 (×7): qty 1

## 2019-08-11 MED ORDER — CIPROFLOXACIN IN D5W 400 MG/200ML IV SOLN: 400.0000 mg | Freq: Once | INTRAVENOUS | Status: DC

## 2019-08-11 MED ORDER — METRONIDAZOLE IN NACL 5-0.79 MG/ML-% IV SOLN
500.0000 mg | Freq: Three times a day (TID) | INTRAVENOUS | Status: DC
  Administered 2019-08-11 – 2019-08-17 (×16): 500 mg via INTRAVENOUS
  Filled 2019-08-11 (×16): qty 100

## 2019-08-11 MED ORDER — METRONIDAZOLE IN NACL 5-0.79 MG/ML-% IV SOLN: 500.0000 mg | Freq: Once | INTRAVENOUS | Status: DC

## 2019-08-11 MED ORDER — ALPRAZOLAM 0.25 MG PO TABS
0.2500 mg | ORAL_TABLET | Freq: Every evening | ORAL | Status: DC | PRN
  Administered 2019-08-13: 22:00:00 0.25 mg via ORAL
  Filled 2019-08-11 (×2): qty 1

## 2019-08-11 MED ORDER — IOHEXOL 300 MG/ML  SOLN
100.0000 mL | Freq: Once | INTRAMUSCULAR | Status: AC | PRN
  Administered 2019-08-11: 100 mL via INTRAVENOUS

## 2019-08-11 NOTE — ED Notes (Signed)
Messaged pharmacy for missing dose K

## 2019-08-11 NOTE — ED Notes (Signed)
Pt ambulatory to [facility] with steady gait.

## 2019-08-11 NOTE — ED Notes (Signed)
ED TO INPATIENT HANDOFF REPORT  ED Nurse Name and Phone #: Gara Kroner, RN  S Name/Age/Gender [name] 63 y.o. female Room/Bed: WA02/WA02  Code Status   Code Status: Full Code  Home/SNF/Other Home Patient oriented to: self, place, time and situation Is this baseline? Yes   Triage Complete: Triage complete  Chief Complaint Acute pancreatitis [K85.90]  Triage Note Pt reports abdominal pain and rectal bleeding x3-4 days. Pt has been to [facility] ED a few times and diagnosed with pancreatitis and colitis. Pt is supposed to follow-up with gastro tomorrow but was concerned about the rectal bleeding.    Allergies No Known Allergies  Level of Care/Admitting Diagnosis ED Disposition    ED Disposition Condition Comment   Admit  Hospital Area: [place] [[id number]] Level of Care: Telemetry [5] Admit to tele based on following criteria: Monitor for Ischemic changes Covid Evaluation: Covid negative Diagnosis: Acute pancreatitis [577.0.ICD-9-CM] Admitting Phy sician: Reubin Milan [[id number]] Attending Physician: Reubin Milan [[id number]]       B Medical/Surgery History Past Medical History:  Diagnosis Date  . Anxiety   . Colitis   . Depression   . Migraine   . Palpitations   . Pancreatitis    Past Surgical History:  Procedure Laterality Date  . APPENDECTOMY Right   . TONSILLECTOMY       A IV Location/Drains/Wounds Patient Lines/Drains/Airways Status   Active Line/Drains/Airways    Name:   Placement date:   Placement time:   Site:   Days:   Peripheral IV 08/11/19 Left Antecubital   08/11/19    1230    Antecubital   less than 1          Intake/Output Last 24 hours  Intake/Output Summary (Last 24 hours) at 08/11/2019 2250 Last data filed at 08/11/2019 1400 Gross per 24 hour  Intake 1000 ml  Output --  Net 1000 ml    Labs/Imaging Results for orders placed or performed during the hospital encounter of 08/11/19 (from the  past 48 hour(s))  Urinalysis, Routine w reflex microscopic     Status: Abnormal   Collection Time: 08/11/19 12:14 PM  Result Value Ref Range   Color, Urine YELLOW YELLOW   APPearance CLEAR CLEAR   Specific Gravity, Urine 1.017 1.005 - 1.030   pH 5.0 5.0 - 8.0   Glucose, UA NEGATIVE NEGATIVE mg/dL   Hgb urine dipstick SMALL (A) NEGATIVE   Bilirubin Urine NEGATIVE NEGATIVE   Ketones, ur 80 (A) NEGATIVE mg/dL   Protein, ur NEGATIVE NEGATIVE mg/dL   Nitrite NEGATIVE NEGATIVE   Leukocytes,Ua TRACE (A) NEGATIVE   RBC / HPF 0-5 0 - 5 RBC/hpf   WBC, UA 0-5 0 - 5 WBC/hpf   Bacteria, UA NONE SEEN NONE SEEN   Squamous Epithelial / LPF 0-5 0 - 5   Mucus PRESENT     Comment: Performed at [facility], [place] [address] [place], [place] [address]  Lipase, blood     Status: Abnormal   Collection Time: 08/11/19 12:30 PM  Result Value Ref Range   Lipase 104 (H) 11 - 51 U/L    Comment: Performed at [facility], [place] [address] [place], [place] [address]  Comprehensive metabolic panel     Status: Abnormal   Collection Time: 08/11/19 12:30 PM  Result Value Ref Range   Sodium 137 135 - 145 mmol/L   Potassium 3.4 (L) 3.5 - 5.1 mmol/L   Chloride 102 98 -  111 mmol/L   CO2 22 22 - 32 mmol/L   Glucose, Bld 86 70 - 99 mg/dL   BUN 9 8 - 23 mg/dL   Creatinine, Ser 0.72 0.44 - 1.00 mg/dL   Calcium 8.2 (L) 8.9 - 10.3 mg/dL   Total Protein 6.5 6.5 - 8.1 g/dL   Albumin 3.5 3.5 - 5.0 g/dL   AST 18 15 - 41 U/L   ALT 19 0 - 44 U/L   Alkaline Phosphatase 69 38 - 126 U/L   Total Bilirubin 1.0 0.3 - 1.2 mg/dL   GFR calc non Af Amer >60 >60 mL/min   GFR calc Af Amer >60 >60 mL/min   Anion gap 13 5 - 15    Comment: Performed at [facility], [place] [address], [place], [place] [address]  CBC     Status: None   Collection Time: 08/11/19 12:30 PM  Result Value Ref Range   WBC 6.9 4.0 - 10.5 K/uL   RBC 4.38 3.87 - 5.11 MIL/uL   Hemoglobin 13.0 12.0 - 15.0  g/dL   HCT 39.7 36.0 - 46.0 %   MCV 90.6 80.0 - 100.0 fL   MCH 29.7 26.0 - 34.0 pg   MCHC 32.7 30.0 - 36.0 g/dL   RDW 13.6 11.5 - 15.5 %   Platelets 220 150 - 400 K/uL   nRBC 0.0 0.0 - 0.2 %    Comment: Performed at [facility], [place] [address], [place], [place] [address]  Type and screen [place]     Status: None   Collection Time: 08/11/19 12:30 PM  Result Value Ref Range   ABO/RH(D) A POS    Antibody Screen NEG    Sample Expiration      08/14/2019,2359 Performed at [facility], [place] [address], [place], [place] [address]   ABO/Rh     Status: None   Collection Time: 08/11/19  2:08 PM  Result Value Ref Range   ABO/RH(D)      A POS Performed at [facility], [place] [address], [place], [place] [address]   SARS CORONAVIRUS 2 (TAT 6-24 HRS) Nasopharyngeal Nasopharyngeal Swab     Status: None   Collection Time: 08/11/19  2:32 PM   Specimen: Nasopharyngeal Swab  Result Value Ref Range   SARS Coronavirus 2 NEGATIVE NEGATIVE    Comment: (NOTE) SARS-CoV-2 target nucleic acids are NOT DETECTED. The SARS-CoV-2 RNA is generally detectable in upper and lower respiratory specimens during the acute phase of infection. Negative results do not preclude SARS-CoV-2 infection, do not rule out co-infections with other pathogens, and should not be used as the sole basis for treatment or other patient management decisions. Negative results must be combined with clinical observations, patient history, and epidemiological information. The expected result is Negative. Fact Sheet for Patients: SugarRoll.be Fact Sheet for Healthcare Providers: https://www.woods-mathews.com/ This test is not yet approved or cleared by the [place] FDA and  has been authorized for detection and/or diagnosis of SARS-CoV-2 by FDA under an Emergency Use Authorization (EUA). This EUA will remain  in  effect (meaning this test can be used) for the duration of the COVID-19 declaration under Section 56 4(b)(1) of the Act, 21 U.S.C. section 360bbb-3(b)(1), unless the authorization is terminated or revoked sooner. Performed at [facility], [place] [address], [place], [place] [address]    CT Abdomen Pelvis W Contrast  Result Date: 08/11/2019 CLINICAL DATA:  3rd emergency visit in  past 5 days with a chief complaint of generalized abdominal pain and rectal bleeding. She was seen at [facility] ED on Saturday and Monday. CT scan revealed "colitis, pancreatitis, UTI". Now her rectal bleeding has increased in frequency and amount. Appointment tomorrow with [facility]. EXAM: CT ABDOMEN AND PELVIS WITH CONTRAST TECHNIQUE: Multidetector CT imaging of the abdomen and pelvis was performed using the standard protocol following bolus administration of intravenous contrast. CONTRAST:  126m OMNIPAQUE IOHEXOL 300 MG/ML  SOLN COMPARISON:  None. FINDINGS: Lower chest: Linear dependent lung base atelectasis. No acute findings. Hepatobiliary: Liver normal in size and attenuation. No mass or focal lesion. Mild intrahepatic bile duct prominence. Common bile duct normal caliber. Is apparent increased attenuation along the gallbladder neck/cystic duct,a which could reflect small stones. Gallbladder otherwise unremarkable with no wall thickening or evidence acute cholecystitis. Pancreas: Unremarkable. No pancreatic ductal dilatation or surrounding inflammatory changes. Spleen: Normal in size without focal abnormality. Adrenals/Urinary Tract: No adrenal masses. Kidneys are normal in position. Left kidney is larger than the right. There are left renal sinus cysts. Subcentimeter low-density lesions are seen in both kidneys too small to fully characterize, but likely also cysts. There is symmetric renal enhancement and excretion. No hydronephrosis. Normal ureters. Bladder mostly decompressed but otherwise unremarkable.  Stomach/Bowel: Mild wall thickening is suggested throughout the colon, with this assessment somewhat limited by lack of colonic distention. Multiple sigmoid colon diverticula, without evidence of diverticulitis. Stomach and small bowel are unremarkable. Vascular/Lymphatic: Prominent to mildly enlarged gastrohepatic ligament lymph nodes, largest measuring 1.3 cm short axis. No other prominent or enlarged lymph nodes. Aortic atherosclerosis. No aneurysm. Reproductive: Uterus and bilateral adnexa are unremarkable. Other: No abdominal wall hernia.  No ascites. Musculoskeletal: No fracture or acute finding. No osteoblastic or osteolytic lesions. IMPRESSION: 1. Mild wall thickening suggested throughout the colon. Suspect mild diffuse inflammatory or infectious colitis. 2. There are sigmoid colon diverticula but no evidence of diverticulitis. 3. No other acute finding within the abdomen pelvis. Prominent to mildly enlarged gastrohepatic ligament lymph nodes, most likely chronic and reactive. 4. Possible small stones in the gallbladder neck and cystic duct, but no evidence of acute cholecystitis. 5. Left renal sinus cysts. 6. Aortic atherosclerosis. Electronically Signed   By: DLajean ManesM.D.   On: 08/11/2019 15:13   [place]Abdomen Limited RUQ  Result Date: 08/11/2019 CLINICAL DATA:  Abdominal pain for 5 days EXAM: ULTRASOUND ABDOMEN LIMITED RIGHT UPPER QUADRANT COMPARISON:  Same-day CT abdomen pelvis. FINDINGS: Gallbladder: No gallstones or wall thickening visualized. No sonographic Murphy sign noted by sonographer. Common bile duct: Diameter: Distal common bile duct is poorly visualized. Proximal bile duct measures 3.4 mm, nondilated. Liver: No focal lesion identified. Within normal limits in parenchymal echogenicity. Portal vein is patent on color Doppler imaging with normal direction of blood flow towards the liver. Other: None. IMPRESSION: Nonvisualization of the distal common bile duct. No visible cholelithiasis,  choledocholithiasis, biliary dilatation or features of acute cholecystitis. Electronically Signed   By: PLovena LeM.D.   On: 08/11/2019 19:29    Pending Labs Unresulted Labs (From admission, onward)    Start     Ordered   08/12/19 0500  Comprehensive metabolic panel  Daily,   R     08/11/19 1552   08/12/19 0500  CBC WITH DIFFERENTIAL  Daily,   R     08/11/19 1552   08/12/19 0500  Lipase, blood  Daily,   R     08/11/19 1552  Vitals/Pain Today's Vitals   08/11/19 2000 08/11/19 2030 08/11/19 2100 08/11/19 2232  BP: (!) 156/75 (!) 143/75 136/75   Pulse: 72 69 71   Resp: 19 19 (!) 22   Temp:      TempSrc:      SpO2: 96% 98% 97%   PainSc:  10-Worst pain ever  9     Isolation Precautions No active isolations  Medications Medications  fentaNYL (SUBLIMAZE) injection 50 mcg (50 mcg Intravenous Given 08/11/19 2146)  0.9 % NaCl with KCl 20 mEq/ L  infusion ( Intravenous New Bag/Given 08/11/19 2113)  ondansetron (ZOFRAN) tablet 4 mg ( Oral See Alternative 08/11/19 1640)    Or  ondansetron (ZOFRAN) injection 4 mg (4 mg Intravenous Given 08/11/19 1640)  ALPRAZolam (XANAX) tablet 0.25 mg (has no administration in time range)  buPROPion (WELLBUTRIN XL) 24 hr tablet 150 mg (150 mg Oral Given 08/11/19 2145)  ciprofloxacin (CIPRO) IVPB 400 mg (0 mg Intravenous Stopped 08/11/19 2111)  metroNIDAZOLE (FLAGYL) IVPB 500 mg (0 mg Intravenous Stopped 08/11/19 1927)  nicotine (NICODERM CQ - dosed in mg/24 hours) patch 14 mg (14 mg Transdermal Patch Applied 08/11/19 1933)  sodium chloride 0.9 % bolus 1,000 mL (0 mLs Intravenous Stopped 08/11/19 1400)  ondansetron (ZOFRAN) injection 4 mg (4 mg Intravenous Given 08/11/19 1232)  fentaNYL (SUBLIMAZE) injection 25 mcg (25 mcg Intravenous Given 08/11/19 1233)  iohexol (OMNIPAQUE) 300 MG/ML solution 100 mL (100 mLs Intravenous Contrast Given 08/11/19 1455)    Mobility walks Low fall risk   Focused Assessments NA   R Recommendations:  See Admitting Provider Note  Report given to:   Additional Notes: N/A

## 2019-08-11 NOTE — ED Notes (Signed)
Pt ambulatory to [facility] independently

## 2019-08-11 NOTE — H&P (Signed)
History and Physical    [name]:[id number] DOB: 08-09-1956 DOA: 08/11/2019  PCP: Isaac Bliss, Rayford Halsted, MD  Patient coming from: Home.  I have personally briefly reviewed patient's old medical records in [place]  Chief Complaint: Abdominal pain  HPI: [name] is a 63 y.o. female with medical history significant of anxiety, depression, colitis, migraine headaches, palpitations, history of pancreatitis who is coming to the emergency department due to abdominal pain for a week and rectal bleeding for the past 3 to 4 days.  On Friday, she went to the ER at [facility], was discharged home after hydration and symptoms treatment.  She states that Saturday on Sunday, the pain continued and she had multiple episodes of emesis.  She went to the ER on Sunday, but returned home after she waited for about 5 hours.  She started having hematochezia, went to the ER, but went home due to a very long wait.  Then on Monday evening, she started having hematochezia.  She was scheduled to see GI tomorrow, but started having abdominal pain and more rectal bleeding today and decided to come to the emergency department at [facility].  ED Course: Initial vital signs were temperature 98.1 F, pulse 77, respirations 16, blood pressure 140/67 mmHg and O2 sat 97% on room air.  The patient received 25 mcg of fentanyl IVP, ondansetron 4 mg IVP and a 1000 mL NS bolus.  White count 6.9, hemoglobin 13.0 g/dL and platelets 220.  Her CMP shows potassium 3.4 mmol/L and a calcium of 8.2 mg/dL (corrected to an albumin of 3.5 g/dL is 8.6), all other values are within normal limits.  Imaging: CT scan abdomen/pelvis showed mild wall thickening suggested throughout the colon which raises suspicion for mild diffuse inflammatory infectious colitis.  Positive diverticuli in sigmoid colon without evidence of diverticulitis.  Possible small stone in the gallbladder and cystic DOS, but no evidence of acute cholecystitis.   Left renal sinus cysts.  Aortic atherosclerosis.  Please see images and full cellular report for further detail.  Review of Systems: As per HPI otherwise 10 point review of systems negative.  Past Medical History:  Diagnosis Date  . Anxiety   . Colitis   . Depression   . Migraine   . Palpitations   . Pancreatitis     Past Surgical History:  Procedure Laterality Date  . APPENDECTOMY Right   . TONSILLECTOMY       reports that she has been smoking cigarettes. She has been smoking about 1.00 pack per day. She has never used smokeless tobacco. She reports current alcohol use. She reports that she does not use drugs.  No Known Allergies  Family History  Problem Relation Age of Onset  . Prostate cancer Father   . Hypertension Mother    Prior to Admission medications   Medication Sig Start Date End Date Taking? Authorizing Provider  ALPRAZolam (XANAX) 0.25 MG tablet Take 1 tablet (0.25 mg total) by mouth at bedtime as needed for anxiety. TAKE 1 TABLET BY MOUTH TWICE A DAY AS NEEDED 07/12/19  Yes Isaac Bliss, Rayford Halsted, MD  buPROPion (WELLBUTRIN XL) 150 MG 24 hr tablet TAKE 1 TABLET BY MOUTH EVERY DAY 06/28/19  Yes Isaac Bliss, Rayford Halsted, MD  ciprofloxacin (CIPRO) 500 MG tablet Take 500 mg by mouth every 12 (twelve) hours. 08/06/19 08/16/19 Yes [provider]  metroNIDAZOLE (FLAGYL) 500 MG tablet Take 500 mg by mouth 2 (two) times daily. 08/06/19 08/16/19 Yes [provider]  Multiple Vitamin (MULTIVITAMIN) tablet Take 1 tablet by mouth daily.   Yes [provider]    Physical Exam: Vitals:   08/11/19 1300 08/11/19 1330 08/11/19 1400 08/11/19 1430  BP: 135/80 (!) 142/66 140/67 (!) 144/71  Pulse: 72 66 72 71  Resp: 16     Temp:      TempSrc:      SpO2: 99% 100% 100% 98%    Constitutional: NAD, calm, comfortable Eyes: PERRL, lids and conjunctivae normal ENMT: Mucous membranes are moist. Posterior pharynx clear of any exudate or  lesions. Neck: normal, supple, no masses, no thyromegaly Respiratory: clear to auscultation bilaterally, no wheezing, no crackles. Normal respiratory effort. No accessory muscle use.  Cardiovascular: Regular rate and rhythm, no murmurs / rubs / gallops. No extremity edema. 2+ pedal pulses. No carotid bruits.  Abdomen: Nondistended. Bowel sounds positive. Positive mild epigastric tenderness, no guarding, rebound or masses palpated. No hepatosplenomegaly.   Musculoskeletal: no clubbing / cyanosis.  Good ROM, no contractures. Normal muscle tone.  Skin: no rashes, lesions, ulcers on limited dermatological examination. Neurologic: CN 2-12 grossly intact. Sensation intact, DTR normal. Strength 5/5 in all 4.  Psychiatric: Normal judgment and insight. Alert and oriented x 3. Normal mood.   Labs on Admission: I have personally reviewed following labs and imaging studies  CBC: Recent Labs  Lab 08/11/19 1230  WBC 6.9  HGB 13.0  HCT 39.7  MCV 90.6  PLT 017   Basic Metabolic Panel: Recent Labs  Lab 08/11/19 1230  NA 137  K 3.4*  CL 102  CO2 22  GLUCOSE 86  BUN 9  CREATININE 0.72  CALCIUM 8.2*   GFR: CrCl cannot be calculated (Unknown ideal weight.). Liver Function Tests: Recent Labs  Lab 08/11/19 1230  AST 18  ALT 19  ALKPHOS 69  BILITOT 1.0  PROT 6.5  ALBUMIN 3.5   Recent Labs  Lab 08/11/19 1230  LIPASE 104*   No results for input(s): AMMONIA in the last 168 hours. Coagulation Profile: No results for input(s): INR, PROTIME in the last 168 hours. Cardiac Enzymes: No results for input(s): CKTOTAL, CKMB, CKMBINDEX, TROPONINI in the last 168 hours. BNP (last 3 results) No results for input(s): PROBNP in the last 8760 hours. HbA1C: No results for input(s): HGBA1C in the last 72 hours. CBG: No results for input(s): GLUCAP in the last 168 hours. Lipid Profile: No results for input(s): CHOL, HDL, LDLCALC, TRIG, CHOLHDL, LDLDIRECT in the last 72 hours. Thyroid Function  Tests: No results for input(s): TSH, T4TOTAL, FREET4, T3FREE, THYROIDAB in the last 72 hours. Anemia Panel: No results for input(s): VITAMINB12, FOLATE, FERRITIN, TIBC, IRON, RETICCTPCT in the last 72 hours. Urine analysis:    Component Value Date/Time   BILIRUBINUR small (A) 01/18/2018 1703   BILIRUBINUR N 04/13/2017 1421   KETONESUR moderate (40) (A) 01/18/2018 1703   PROTEINUR trace (A) 01/18/2018 1703   PROTEINUR N 04/13/2017 1421   UROBILINOGEN 0.2 01/18/2018 1703   NITRITE Negative 01/18/2018 1703   NITRITE N 04/13/2017 1421   LEUKOCYTESUR Trace (A) 01/18/2018 1703    Radiological Exams on Admission: CT Abdomen Pelvis W Contrast  Result Date: 08/11/2019 CLINICAL DATA:  3rd emergency visit in past 5 days with a chief complaint of generalized abdominal pain and rectal bleeding. She was seen at [facility] ED on Saturday and Monday. CT scan revealed "colitis, pancreatitis, UTI". Now her rectal bleeding has increased in frequency and amount. Appointment tomorrow with [facility]. EXAM: CT ABDOMEN AND  PELVIS WITH CONTRAST TECHNIQUE: Multidetector CT imaging of the abdomen and pelvis was performed using the standard protocol following bolus administration of intravenous contrast. CONTRAST:  132m OMNIPAQUE IOHEXOL 300 MG/ML  SOLN COMPARISON:  None. FINDINGS: Lower chest: Linear dependent lung base atelectasis. No acute findings. Hepatobiliary: Liver normal in size and attenuation. No mass or focal lesion. Mild intrahepatic bile duct prominence. Common bile duct normal caliber. Is apparent increased attenuation along the gallbladder neck/cystic duct,a which could reflect small stones. Gallbladder otherwise unremarkable with no wall thickening or evidence acute cholecystitis. Pancreas: Unremarkable. No pancreatic ductal dilatation or surrounding inflammatory changes. Spleen: Normal in size without focal abnormality. Adrenals/Urinary Tract: No adrenal masses. Kidneys are normal in position. Left  kidney is larger than the right. There are left renal sinus cysts. Subcentimeter low-density lesions are seen in both kidneys too small to fully characterize, but likely also cysts. There is symmetric renal enhancement and excretion. No hydronephrosis. Normal ureters. Bladder mostly decompressed but otherwise unremarkable. Stomach/Bowel: Mild wall thickening is suggested throughout the colon, with this assessment somewhat limited by lack of colonic distention. Multiple sigmoid colon diverticula, without evidence of diverticulitis. Stomach and small bowel are unremarkable. Vascular/Lymphatic: Prominent to mildly enlarged gastrohepatic ligament lymph nodes, largest measuring 1.3 cm short axis. No other prominent or enlarged lymph nodes. Aortic atherosclerosis. No aneurysm. Reproductive: Uterus and bilateral adnexa are unremarkable. Other: No abdominal wall hernia.  No ascites. Musculoskeletal: No fracture or acute finding. No osteoblastic or osteolytic lesions. IMPRESSION: 1. Mild wall thickening suggested throughout the colon. Suspect mild diffuse inflammatory or infectious colitis. 2. There are sigmoid colon diverticula but no evidence of diverticulitis. 3. No other acute finding within the abdomen pelvis. Prominent to mildly enlarged gastrohepatic ligament lymph nodes, most likely chronic and reactive. 4. Possible small stones in the gallbladder neck and cystic duct, but no evidence of acute cholecystitis. 5. Left renal sinus cysts. 6. Aortic atherosclerosis. Electronically Signed   By: DLajean ManesM.D.   On: 08/11/2019 15:13    EKG: Independently reviewed. Vent. rate 70 BPM PR interval 130 ms QRS duration 80 ms QT/QTc 420/453 ms P-R-T axes 80 81 80 NSR Normal ECG  Assessment/Plan Principal Problem:   Acute pancreatitis Observation/telemetry. Continue IV fluids. Continue analgesics as as needed. Continue antiemetics as needed. Clear liquid diet. Check RUQ ultrasound to evaluate  cholelithiasis. Follow-up CBC, CMP and lipase in a.m.  Active Problems:   Colitis This could be the cause of her rectal bleeding. Continue Cipro and Flagyl, but switch to IV. Analgesics as needed. Antiemetics as needed. Consider GI consult in a.m.    Hypokalemia Replacing. Follow-up potassium level.    Anxiety with depression Continue Wellbutrin 150 mg p.o. daily. Continue alprazolam 0.25 mg p.o. at bedtime as needed.   DVT prophylaxis: SCDs. Code Status: Full code. Family Communication:  Disposition Plan: Observation for acute pancreatitis and colitis treatment. Consults called:  Admission status: Observation/telemetry.   DReubin MilanMD [facility]  If 7PM-7AM, please contact night-coverage [email or web address] Password [facility] 08/11/2019, 3:55 PM   This document was prepared using Dragon voice recognition software and may contain some unintended transcription errors

## 2019-08-11 NOTE — ED Provider Notes (Addendum)
[place] DEPT Provider Note   CSN: [id number] Arrival date & time: 08/11/19  1058     History Chief Complaint  Patient presents with  . Abdominal Pain  . Rectal Bleeding    [name] is a 63 y.o. female.  Level 5 caveat for acuity of condition.  3rd emergency visit in past 5 days with a chief complaint of generalized abdominal pain and rectal bleeding.  She was seen at [facility] ED on Saturday and Monday.  CT scan revealed "colitis, pancreatitis, UTI".  Now her rectal bleeding has increased in frequency and amount.  Appointment tomorrow with [facility].          Past Medical History:  Diagnosis Date  . Anxiety   . Colitis   . Depression   . Migraine   . Palpitations   . Pancreatitis     Patient Active Problem List   Diagnosis Date Noted  . Skin cancer 10/13/2018  . Tobacco abuse 04/14/2015  . Insomnia 10/10/2011  . Depression 10/29/2009  . PALPITATIONS, HX OF 10/29/2009  . Anxiety state 04/11/2008    Past Surgical History:  Procedure Laterality Date  . APPENDECTOMY Right   . TONSILLECTOMY       OB History   No obstetric history on file.     Family History  Problem Relation Age of Onset  . Prostate cancer Father   . Hypertension Mother     Social History   Tobacco Use  . Smoking status: Current Every Day Smoker    Packs/day: 1.00    Types: Cigarettes  . Smokeless tobacco: Never Used  . Tobacco comment: quit date: 04/23/2015. ADE with [employer]  Substance Use Topics  . Alcohol use: Yes    Alcohol/week: 0.0 standard drinks  . Drug use: No    Home Medications Prior to Admission medications   Medication Sig Start Date End Date Taking? Authorizing Provider  ALPRAZolam (XANAX) 0.25 MG tablet Take 1 tablet (0.25 mg total) by mouth at bedtime as needed for anxiety. TAKE 1 TABLET BY MOUTH TWICE A DAY AS NEEDED 07/12/19  Yes Isaac Bliss, Rayford Halsted, MD  buPROPion (WELLBUTRIN XL) 150 MG 24 hr tablet TAKE 1  TABLET BY MOUTH EVERY DAY 06/28/19  Yes Isaac Bliss, Rayford Halsted, MD  ciprofloxacin (CIPRO) 500 MG tablet Take 500 mg by mouth every 12 (twelve) hours. 08/06/19 08/16/19 Yes [provider]  metroNIDAZOLE (FLAGYL) 500 MG tablet Take 500 mg by mouth 2 (two) times daily. 08/06/19 08/16/19 Yes [provider]  Multiple Vitamin (MULTIVITAMIN) tablet Take 1 tablet by mouth daily.   Yes [provider]    Allergies    Patient has no known allergies.  Review of Systems   Review of Systems  Unable to perform ROS: Acuity of condition    Physical Exam Updated Vital Signs BP 135/80   Pulse 72   Temp 98.1 F (36.7 C) (Oral)   Resp 16   SpO2 99%   Physical Exam Vitals and nursing note reviewed.  Constitutional:      Comments: pale  HENT:     Head: Normocephalic and atraumatic.  Eyes:     Conjunctiva/sclera: Conjunctivae normal.  Cardiovascular:     Rate and Rhythm: Normal rate and regular rhythm.  Pulmonary:     Effort: Pulmonary effort is normal.     Breath sounds: Normal breath sounds.  Abdominal:     General: Bowel sounds are normal.     Palpations: Abdomen is  soft.     Comments: Generalized tenderness  Musculoskeletal:        General: Normal range of motion.     Cervical back: Neck supple.  Skin:    General: Skin is warm and dry.  Neurological:     General: No focal deficit present.     Mental Status: She is alert and oriented to person, place, and time.  Psychiatric:        Behavior: Behavior normal.     ED Results / Procedures / Treatments   Labs (all labs ordered are listed, but only abnormal results are displayed) Labs Reviewed  LIPASE, BLOOD - Abnormal; Notable for the following components:      Result Value   Lipase 104 (*)    All other components within normal limits  COMPREHENSIVE METABOLIC PANEL - Abnormal; Notable for the following components:   Potassium 3.4 (*)    Calcium 8.2 (*)    All other components within normal  limits  CBC  URINALYSIS, ROUTINE W REFLEX MICROSCOPIC  TYPE AND SCREEN    EKG None  Radiology No results found.  Procedures Procedures (including critical care time)  Medications Ordered in ED Medications  sodium chloride 0.9 % bolus 1,000 mL (1,000 mLs Intravenous New Bag/Given 08/11/19 1236)  ondansetron (ZOFRAN) injection 4 mg (4 mg Intravenous Given 08/11/19 1232)  fentaNYL (SUBLIMAZE) injection 25 mcg (25 mcg Intravenous Given 08/11/19 1233)    ED Course  I have reviewed the triage vital signs and the nursing notes.  Pertinent labs & imaging results that were available during my care of the patient were reviewed by me and considered in my medical decision making (see chart for details).    MDM Rules/Calculators/A&P                      Patient presents with generalized abdominal pain and rectal bleeding.  Hemoglobin stable.  Lipase 104.  Will admit for further evaluation. CT abd/pelvis pending.  Disc c Dr Olevia Bowens Final Clinical Impression(s) / ED Diagnoses Final diagnoses:  Abdominal pain, unspecified abdominal location  Rectal bleeding  Elevated lipase    Rx / DC Orders ED Discharge Orders    None       Nat Christen, MD 08/11/19 1339    Nat Christen, MD 08/11/19 1339    Nat Christen, MD 08/11/19 1413

## 2019-08-11 NOTE — ED Triage Notes (Signed)
Pt reports abdominal pain and rectal bleeding x3-4 days. Pt has been to [facility] ED a few times and diagnosed with pancreatitis and colitis. Pt is supposed to follow-up with gastro tomorrow but was concerned about the rectal bleeding.

## 2019-08-11 NOTE — ED Notes (Signed)
Pt made aware urine sample needed

## 2019-08-12 ENCOUNTER — Ambulatory Visit: Payer: Managed Care, Other (non HMO) | Admitting: Physician Assistant

## 2019-08-12 ENCOUNTER — Telehealth: Payer: 59 | Admitting: Internal Medicine

## 2019-08-12 ENCOUNTER — Encounter

## 2019-08-12 DIAGNOSIS — Z79899 Other long term (current) drug therapy: Secondary | ICD-10-CM | POA: Diagnosis not present

## 2019-08-12 DIAGNOSIS — Z8249 Family history of ischemic heart disease and other diseases of the circulatory system: Secondary | ICD-10-CM | POA: Diagnosis not present

## 2019-08-12 DIAGNOSIS — K529 Noninfective gastroenteritis and colitis, unspecified: Secondary | ICD-10-CM | POA: Diagnosis not present

## 2019-08-12 DIAGNOSIS — F17210 Nicotine dependence, cigarettes, uncomplicated: Secondary | ICD-10-CM | POA: Diagnosis present

## 2019-08-12 DIAGNOSIS — R7982 Elevated C-reactive protein (CRP): Secondary | ICD-10-CM | POA: Diagnosis present

## 2019-08-12 DIAGNOSIS — K635 Polyp of colon: Secondary | ICD-10-CM | POA: Diagnosis present

## 2019-08-12 DIAGNOSIS — Z20828 Contact with and (suspected) exposure to other viral communicable diseases: Secondary | ICD-10-CM | POA: Diagnosis present

## 2019-08-12 DIAGNOSIS — K2980 Duodenitis without bleeding: Secondary | ICD-10-CM | POA: Diagnosis present

## 2019-08-12 DIAGNOSIS — R112 Nausea with vomiting, unspecified: Secondary | ICD-10-CM | POA: Diagnosis not present

## 2019-08-12 DIAGNOSIS — K621 Rectal polyp: Secondary | ICD-10-CM | POA: Diagnosis not present

## 2019-08-12 DIAGNOSIS — Z85828 Personal history of other malignant neoplasm of skin: Secondary | ICD-10-CM | POA: Diagnosis not present

## 2019-08-12 DIAGNOSIS — K859 Acute pancreatitis without necrosis or infection, unspecified: Secondary | ICD-10-CM | POA: Diagnosis not present

## 2019-08-12 DIAGNOSIS — F418 Other specified anxiety disorders: Secondary | ICD-10-CM | POA: Diagnosis present

## 2019-08-12 DIAGNOSIS — R748 Abnormal levels of other serum enzymes: Secondary | ICD-10-CM | POA: Diagnosis present

## 2019-08-12 DIAGNOSIS — K5190 Ulcerative colitis, unspecified, without complications: Secondary | ICD-10-CM | POA: Diagnosis present

## 2019-08-12 DIAGNOSIS — R109 Unspecified abdominal pain: Secondary | ICD-10-CM | POA: Diagnosis not present

## 2019-08-12 DIAGNOSIS — E876 Hypokalemia: Secondary | ICD-10-CM | POA: Diagnosis present

## 2019-08-12 DIAGNOSIS — G4700 Insomnia, unspecified: Secondary | ICD-10-CM | POA: Diagnosis present

## 2019-08-12 DIAGNOSIS — E860 Dehydration: Secondary | ICD-10-CM | POA: Diagnosis present

## 2019-08-12 DIAGNOSIS — R1030 Lower abdominal pain, unspecified: Secondary | ICD-10-CM | POA: Diagnosis not present

## 2019-08-12 DIAGNOSIS — K3189 Other diseases of stomach and duodenum: Secondary | ICD-10-CM | POA: Diagnosis not present

## 2019-08-12 DIAGNOSIS — K625 Hemorrhage of anus and rectum: Secondary | ICD-10-CM | POA: Diagnosis not present

## 2019-08-12 LAB — COMPREHENSIVE METABOLIC PANEL
ALT: 15 U/L (ref 0–44)
AST: 13 U/L — ABNORMAL LOW (ref 15–41)
Albumin: 3.1 g/dL — ABNORMAL LOW (ref 3.5–5.0)
Alkaline Phosphatase: 60 U/L (ref 38–126)
Anion gap: 12 (ref 5–15)
BUN: 6 mg/dL — ABNORMAL LOW (ref 8–23)
CO2: 17 mmol/L — ABNORMAL LOW (ref 22–32)
Calcium: 7.8 mg/dL — ABNORMAL LOW (ref 8.9–10.3)
Chloride: 106 mmol/L (ref 98–111)
Creatinine, Ser: 0.62 mg/dL (ref 0.44–1.00)
GFR calc Af Amer: >60 mL/min (ref >60)
GFR calc non Af Amer: >60 mL/min (ref >60)
Glucose, Bld: 101 mg/dL — ABNORMAL HIGH (ref 70–99)
Potassium: 3.5 mmol/L (ref 3.5–5.1)
Sodium: 135 mmol/L (ref 135–145)
Total Bilirubin: 1.3 mg/dL — ABNORMAL HIGH (ref 0.3–1.2)
Total Protein: 5.7 g/dL — ABNORMAL LOW (ref 6.5–8.1)

## 2019-08-12 LAB — CBC WITH DIFFERENTIAL/PLATELET
Abs Immature Granulocytes: 0.02 10*3/uL (ref 0.00–0.07)
Basophils Absolute: 0 10*3/uL (ref 0.0–0.1)
Basophils Relative: 0 %
Eosinophils Absolute: 0 10*3/uL (ref 0.0–0.5)
Eosinophils Relative: 0 %
HCT: 35 % — ABNORMAL LOW (ref 36.0–46.0)
Hemoglobin: 11.6 g/dL — ABNORMAL LOW (ref 12.0–15.0)
Immature Granulocytes: 0 %
Lymphocytes Relative: 16 %
Lymphs Abs: 1.5 10*3/uL (ref 0.7–4.0)
MCH: 29.4 pg (ref 26.0–34.0)
MCHC: 33.1 g/dL (ref 30.0–36.0)
MCV: 88.6 fL (ref 80.0–100.0)
Monocytes Absolute: 0.8 10*3/uL (ref 0.1–1.0)
Monocytes Relative: 9 %
Neutro Abs: 6.9 10*3/uL (ref 1.7–7.7)
Neutrophils Relative %: 75 %
Platelets: 203 10*3/uL (ref 150–400)
RBC: 3.95 MIL/uL (ref 3.87–5.11)
RDW: 13.4 % (ref 11.5–15.5)
WBC: 9.2 10*3/uL (ref 4.0–10.5)
nRBC: 0 % (ref 0.0–0.2)

## 2019-08-12 LAB — LIPASE, BLOOD: Lipase: 152 U/L — ABNORMAL HIGH (ref 11–51)

## 2019-08-12 MED ORDER — PROMETHAZINE HCL 25 MG/ML IJ SOLN
12.5000 mg | Freq: Four times a day (QID) | INTRAMUSCULAR | Status: DC | PRN
  Administered 2019-08-12 – 2019-08-18 (×6): 12.5 mg via INTRAVENOUS
  Filled 2019-08-12 (×6): qty 1

## 2019-08-12 MED ORDER — ADULT MULTIVITAMIN W/MINERALS CH
1.0000 | ORAL_TABLET | Freq: Every day | ORAL | Status: DC
  Administered 2019-08-12 – 2019-08-17 (×6): 1 via ORAL
  Filled 2019-08-12 (×6): qty 1

## 2019-08-12 MED ORDER — BOOST / RESOURCE BREEZE PO LIQD CUSTOM
1.0000 | Freq: Three times a day (TID) | ORAL | Status: DC
  Administered 2019-08-12 – 2019-08-16 (×5): 1 via ORAL

## 2019-08-12 NOTE — Progress Notes (Signed)
PROGRESS NOTE    [name]  BUL:[id number] DOB: May 10, 1956 DOA: 08/11/2019 PCP: Isaac Bliss, Rayford Halsted, MD   Brief Narrative:  Patient is a 63 year old female with history of anxiety, depression, headaches, history of pancreatitis who presented to the emergency department from home with complaints of abdominal pain, rectal bleeding, nausea, vomiting, diarrhea.  This has been going on for last 1 week.  On presentation she was hemodynamically stable.  Hemoglobin was stable.  She looked dehydrated on presentation and was started on IV fluids.  CT abdomen/pelvis showed mild wall thickening of the colon suggestive of diffuse inflammatory versus infectious colitis.  Started on IV antibiotics.  Assessment & Plan:   Principal Problem:   Acute pancreatitis Active Problems:   Colitis   Hypokalemia   Anxiety with depression   Acute colitis: CT abdomen and pelvis showed mild wall thickening of the colon.  Could not rule out infectious colitis.  Started on antibiotics.  We will send GI pathogen panel. No evidence of pancreatitis as per CT abdomen/pelvis.  Ultrasound of the gallbladder did not show any cholelithiasis or cholecystitis  Abdominal pain, nausea, vomiting, diarrhea: The symptoms have been much better.  Continue IV fluids, antiemetics.  Hematochezia: Patient also reported hematochezia but her hemoglobin is currently stable.  Not a candidate for colonoscopy at this point.  GI not consulted.  Currently she does not have any active rectal bleeding.  Continue to monitor CBC.  Anxiety with depression: On Wellbutrin and Xanax at home          DVT prophylaxis:SCD Code Status: Full Family Communication: None.  Discussed the management plan with the patient Disposition Plan: Likely home in the next 24 to 48 hours   Consultants: None  Procedures:None  Antimicrobials:  Anti-infectives (From admission, onward)   Start     Dose/Rate Route Frequency Ordered Stop   08/11/19  1830  metroNIDAZOLE (FLAGYL) IVPB 500 mg     500 mg 100 mL/hr over 60 Minutes Intravenous Every 8 hours 08/11/19 1641     08/11/19 1800  ciprofloxacin (CIPRO) IVPB 400 mg     400 mg 200 mL/hr over 60 Minutes Intravenous Every 12 hours 08/11/19 1641     08/11/19 1700  metroNIDAZOLE (FLAGYL) IVPB 500 mg  Status:  Discontinued     500 mg 100 mL/hr over 60 Minutes Intravenous  Once 08/11/19 1648 08/11/19 1754   08/11/19 1700  ciprofloxacin (CIPRO) IVPB 400 mg  Status:  Discontinued     400 mg 200 mL/hr over 60 Minutes Intravenous  Once 08/11/19 1650 08/11/19 1753      Subjective:  Patient seen and examined at bedside this morning.  Hemodynamically stable.  Appears weak but feeling better today.  Tolerating clear liquid diet.  Nausea, vomiting and diarrhea have improved.  Objective: Vitals:   08/11/19 2200 08/11/19 2334 08/12/19 0019 08/12/19 0548  BP: (!) 142/72 135/63  (!) 129/112  Pulse:  69  77  Resp: 17 17  17   Temp:  98.5 F (36.9 C)  98.1 F (36.7 C)  TempSrc:      SpO2:  100%  98%  Weight:   57 kg   Height:   5' 5"  (1.651 m)     Intake/Output Summary (Last 24 hours) at 08/12/2019 1333 Last data filed at 08/11/2019 1400 Gross per 24 hour  Intake 1000 ml  Output --  Net 1000 ml   Filed Weights   08/12/19 0019  Weight: 57 kg    Examination:  General exam: Not in distress,average built HEENT:PERRL,Oral mucosa moist, Ear/Nose normal on gross exam Respiratory system: Bilateral equal air entry, normal vesicular breath sounds, no wheezes or crackles  Cardiovascular system: S1 & S2 heard, RRR. No JVD, murmurs, rubs, gallops or clicks. No pedal edema. Gastrointestinal system: Abdomen is nondistended, soft and nontender. No organomegaly or masses felt. Normal bowel sounds heard. Central nervous system: Alert and oriented. No focal neurological deficits. Extremities: No edema, no clubbing ,no cyanosis Skin: No rashes, lesions or ulcers,no icterus ,no pallor   Data  Reviewed: I have personally reviewed following labs and imaging studies  CBC: Recent Labs  Lab 08/11/19 1230 08/12/19 0541  WBC 6.9 9.2  NEUTROABS  --  6.9  HGB 13.0 11.6*  HCT 39.7 35.0*  MCV 90.6 88.6  PLT 220 563   Basic Metabolic Panel: Recent Labs  Lab 08/11/19 1230 08/12/19 0541  NA 137 135  K 3.4* 3.5  CL 102 106  CO2 22 17*  GLUCOSE 86 101*  BUN 9 6*  CREATININE 0.72 0.62  CALCIUM 8.2* 7.8*   GFR: Estimated Creatinine Clearance: 65.6 mL/min (by C-G formula based on SCr of 0.62 mg/dL). Liver Function Tests: Recent Labs  Lab 08/11/19 1230 08/12/19 0541  AST 18 13*  ALT 19 15  ALKPHOS 69 60  BILITOT 1.0 1.3*  PROT 6.5 5.7*  ALBUMIN 3.5 3.1*   Recent Labs  Lab 08/11/19 1230 08/12/19 0541  LIPASE 104* 152*   No results for input(s): AMMONIA in the last 168 hours. Coagulation Profile: No results for input(s): INR, PROTIME in the last 168 hours. Cardiac Enzymes: No results for input(s): CKTOTAL, CKMB, CKMBINDEX, TROPONINI in the last 168 hours. BNP (last 3 results) No results for input(s): PROBNP in the last 8760 hours. HbA1C: No results for input(s): HGBA1C in the last 72 hours. CBG: No results for input(s): GLUCAP in the last 168 hours. Lipid Profile: No results for input(s): CHOL, HDL, LDLCALC, TRIG, CHOLHDL, LDLDIRECT in the last 72 hours. Thyroid Function Tests: No results for input(s): TSH, T4TOTAL, FREET4, T3FREE, THYROIDAB in the last 72 hours. Anemia Panel: No results for input(s): VITAMINB12, FOLATE, FERRITIN, TIBC, IRON, RETICCTPCT in the last 72 hours. Sepsis Labs: No results for input(s): PROCALCITON, LATICACIDVEN in the last 168 hours.  Recent Results (from the past 240 hour(s))  SARS CORONAVIRUS 2 (TAT 6-24 HRS) Nasopharyngeal Nasopharyngeal Swab     Status: None   Collection Time: 08/11/19  2:32 PM   Specimen: Nasopharyngeal Swab  Result Value Ref Range Status   SARS Coronavirus 2 NEGATIVE NEGATIVE Final    Comment:  (NOTE) SARS-CoV-2 target nucleic acids are NOT DETECTED. The SARS-CoV-2 RNA is generally detectable in upper and lower respiratory specimens during the acute phase of infection. Negative results do not preclude SARS-CoV-2 infection, do not rule out co-infections with other pathogens, and should not be used as the sole basis for treatment or other patient management decisions. Negative results must be combined with clinical observations, patient history, and epidemiological information. The expected result is Negative. Fact Sheet for Patients: SugarRoll.be Fact Sheet for Healthcare Providers: https://www.woods-mathews.com/ This test is not yet approved or cleared by the [place] FDA and  has been authorized for detection and/or diagnosis of SARS-CoV-2 by FDA under an Emergency Use Authorization (EUA). This EUA will remain  in effect (meaning this test can be used) for the duration of the COVID-19 declaration under Section 56 4(b)(1) of the Act, 21 U.S.C. section 360bbb-3(b)(1), unless the authorization is terminated  or revoked sooner. Performed at [facility], [place] [address], [place], [place] [address]          Radiology Studies: CT Abdomen Pelvis W Contrast  Result Date: 08/11/2019 CLINICAL DATA:  3rd emergency visit in past 5 days with a chief complaint of generalized abdominal pain and rectal bleeding. She was seen at [facility] ED on Saturday and Monday. CT scan revealed "colitis, pancreatitis, UTI". Now her rectal bleeding has increased in frequency and amount. Appointment tomorrow with [facility]. EXAM: CT ABDOMEN AND PELVIS WITH CONTRAST TECHNIQUE: Multidetector CT imaging of the abdomen and pelvis was performed using the standard protocol following bolus administration of intravenous contrast. CONTRAST:  1110m OMNIPAQUE IOHEXOL 300 MG/ML  SOLN COMPARISON:  None. FINDINGS: Lower chest: Linear dependent lung base  atelectasis. No acute findings. Hepatobiliary: Liver normal in size and attenuation. No mass or focal lesion. Mild intrahepatic bile duct prominence. Common bile duct normal caliber. Is apparent increased attenuation along the gallbladder neck/cystic duct,a which could reflect small stones. Gallbladder otherwise unremarkable with no wall thickening or evidence acute cholecystitis. Pancreas: Unremarkable. No pancreatic ductal dilatation or surrounding inflammatory changes. Spleen: Normal in size without focal abnormality. Adrenals/Urinary Tract: No adrenal masses. Kidneys are normal in position. Left kidney is larger than the right. There are left renal sinus cysts. Subcentimeter low-density lesions are seen in both kidneys too small to fully characterize, but likely also cysts. There is symmetric renal enhancement and excretion. No hydronephrosis. Normal ureters. Bladder mostly decompressed but otherwise unremarkable. Stomach/Bowel: Mild wall thickening is suggested throughout the colon, with this assessment somewhat limited by lack of colonic distention. Multiple sigmoid colon diverticula, without evidence of diverticulitis. Stomach and small bowel are unremarkable. Vascular/Lymphatic: Prominent to mildly enlarged gastrohepatic ligament lymph nodes, largest measuring 1.3 cm short axis. No other prominent or enlarged lymph nodes. Aortic atherosclerosis. No aneurysm. Reproductive: Uterus and bilateral adnexa are unremarkable. Other: No abdominal wall hernia.  No ascites. Musculoskeletal: No fracture or acute finding. No osteoblastic or osteolytic lesions. IMPRESSION: 1. Mild wall thickening suggested throughout the colon. Suspect mild diffuse inflammatory or infectious colitis. 2. There are sigmoid colon diverticula but no evidence of diverticulitis. 3. No other acute finding within the abdomen pelvis. Prominent to mildly enlarged gastrohepatic ligament lymph nodes, most likely chronic and reactive. 4. Possible small  stones in the gallbladder neck and cystic duct, but no evidence of acute cholecystitis. 5. Left renal sinus cysts. 6. Aortic atherosclerosis. Electronically Signed   By: DLajean ManesM.D.   On: 08/11/2019 15:13   UKoreaAbdomen Limited RUQ  Result Date: 08/11/2019 CLINICAL DATA:  Abdominal pain for 5 days EXAM: ULTRASOUND ABDOMEN LIMITED RIGHT UPPER QUADRANT COMPARISON:  Same-day CT abdomen pelvis. FINDINGS: Gallbladder: No gallstones or wall thickening visualized. No sonographic Murphy sign noted by sonographer. Common bile duct: Diameter: Distal common bile duct is poorly visualized. Proximal bile duct measures 3.4 mm, nondilated. Liver: No focal lesion identified. Within normal limits in parenchymal echogenicity. Portal vein is patent on color Doppler imaging with normal direction of blood flow towards the liver. Other: None. IMPRESSION: Nonvisualization of the distal common bile duct. No visible cholelithiasis, choledocholithiasis, biliary dilatation or features of acute cholecystitis. Electronically Signed   By: PLovena LeM.D.   On: 08/11/2019 19:29        Scheduled Meds: . buPROPion  150 mg Oral Daily  . feeding supplement  1 Container Oral TID BM  . nicotine  14 mg Transdermal Daily   Continuous Infusions: .  0.9 % NaCl with KCl 20 mEq / L 100 mL/hr at 08/12/19 1221  . ciprofloxacin 400 mg (08/12/19 0627)  . metronidazole 500 mg (08/12/19 1222)     LOS: 0 days    Time spent: 25 mins.More than 50% of that time was spent in counseling and/or coordination of care.      Shelly Coss, MD [facility] Pager [phone]  If 7PM-7AM, please contact night-coverage [email or web address] Password [email or web address] 08/12/2019, 1:33 PM

## 2019-08-12 NOTE — Progress Notes (Signed)
Initial Nutrition Assessment  DOCUMENTATION CODES:   Not applicable  INTERVENTION:  -Continue Boost Breeze po TID, each supplement provides 250 kcal and 9 grams of protein  -Prostat po BID, each supplement provides 100 kcal and 15 grams of protein  -MVI with minerals daily  -Advance diet as tolerated   NUTRITION DIAGNOSIS:   Inadequate oral intake related to acute illness as evidenced by other (comment)(pt consuming 25-50% of  CL diet order).   GOAL:   Patient will meet greater than or equal to 90% of their needs    MONITOR:   Weight trends, Supplement acceptance, Labs, I & O's, Diet advancement, PO intake  REASON FOR ASSESSMENT:   Malnutrition Screening Tool    ASSESSMENT:  63 year old female with history of anxiety, depression, headaches, h/o pancreatitis who presented to ED with complaints of abdominal pain, rectal bleeding, n/v/d over the past week. CT abdomen/pelvis showed mild wall thickening of colon suggestive of diffuse inflammatory verses infectious colitis; pt started on antibiotics and GI pathogen panel sent.  Patient consuming 25-50% of clear liquids at this time. Per chart review, pt appears weak but reports feeling better today; nausea, vomiting, diarrhea have improved. Patient is provided [employer] three times daily. RD will continue to monitor for diet advancement and recommend daily MVI with minerals.   Medications reviewed and include: Cipro IVBP 400 mg every 12 hrs, Flagyl IVBP 500 mg every 8 hrs  Labs: Albumin 3.1 (L), Corrected Ca 8.52 (L), Hgb 11.6 (H), Lipase 152 (H), Total bilirubin 1.3 (H)  Current wt 57 kg (125.4 lbs) Weight history reviewed, noted 13 lb (9.4%) wt loss over the past 8 months which insignificant for time frame.   NUTRITION - FOCUSED PHYSICAL EXAM: Deferred    Diet Order:   Diet Order            Diet clear liquid Room service appropriate? Yes; Fluid consistency: Thin  Diet effective now              EDUCATION  NEEDS:   No education needs have been identified at this time  Skin:  Skin Assessment: Reviewed RN Assessment  Last BM:  12/17  Height:   Ht Readings from Last 1 Encounters:  08/12/19 5' 5"  (1.651 m)    Weight:   Wt Readings from Last 1 Encounters:  08/12/19 57 kg    Ideal Body Weight:  56.8 kg  BMI:  Body mass index is 20.91 kg/m.  Estimated Nutritional Needs:   Kcal:  1500-1700  Protein:  75-85  Fluid:  >/= 1.5 L/day   Lajuan Lines, RD, LDN Clinical Nutrition Office [phone] After Hours/Weekend Pager: [phone]

## 2019-08-13 LAB — BASIC METABOLIC PANEL
Anion gap: 9 (ref 5–15)
BUN: <5 mg/dL — ABNORMAL LOW (ref 8–23)
CO2: 21 mmol/L — ABNORMAL LOW (ref 22–32)
Calcium: 7.8 mg/dL — ABNORMAL LOW (ref 8.9–10.3)
Chloride: 106 mmol/L (ref 98–111)
Creatinine, Ser: 0.62 mg/dL (ref 0.44–1.00)
GFR calc Af Amer: >60 mL/min (ref >60)
GFR calc non Af Amer: >60 mL/min (ref >60)
Glucose, Bld: 108 mg/dL — ABNORMAL HIGH (ref 70–99)
Potassium: 3.3 mmol/L — ABNORMAL LOW (ref 3.5–5.1)
Sodium: 136 mmol/L (ref 135–145)

## 2019-08-13 MED ORDER — POTASSIUM CHLORIDE 20 MEQ PO PACK
40.0000 meq | PACK | Freq: Once | ORAL | Status: AC
  Administered 2019-08-13: 40 meq via ORAL
  Filled 2019-08-13: qty 2

## 2019-08-13 NOTE — Progress Notes (Addendum)
PROGRESS NOTE    [name]  LYY:[id number] DOB: 08/06/1956 DOA: 08/11/2019 PCP: Isaac Bliss, Rayford Halsted, MD   Brief Narrative:  Patient is a 63 year old female with history of anxiety, depression, headaches, history of pancreatitis who presented to the emergency department from home with complaints of abdominal pain, rectal bleeding, nausea, vomiting, diarrhea.  This has been going on for last 1 week.  On presentation she was hemodynamically stable.  Hemoglobin was stable.  She looked dehydrated on presentation and was started on IV fluids.  CT abdomen/pelvis showed mild wall thickening of the colon suggestive of diffuse inflammatory versus infectious colitis.  Started on IV antibiotics.  She is still complaining of diarrhea this morning.  Assessment & Plan:   Principal Problem:   Acute pancreatitis Active Problems:   Colitis   Hypokalemia   Anxiety with depression   Acute colitis: CT abdomen and pelvis showed mild wall thickening of the colon.  Could not rule out infectious colitis.  Started on antibiotics.  We have sent GI pathogen panel. No evidence of pancreatitis as per CT abdomen/pelvis.  Ultrasound of the gallbladder did not show any cholelithiasis or cholecystitis.  Abdominal pain, nausea, vomiting, diarrhea: Still having multiple episodes of loose bowel stools.  Abdomen pain, nausea or vomiting are better.  Continue IV fluids.Will advance the diet to full.  Hematochezia: Patient also reported hematochezia but her hemoglobin is currently stable.  Not a candidate for colonoscopy at this point.  GI not consulted.  Currently she does not have any active rectal bleeding.  Continue to monitor CBC.  Anxiety with depression: On Wellbutrin and Xanax at home   Nutrition Problem: Inadequate oral intake Etiology: acute illness      DVT prophylaxis:SCD Code Status: Full Family Communication: Husband present at th bed side Disposition Plan: Likely home in the next 24 to 48  hours   Consultants: None  Procedures:None  Antimicrobials:  Anti-infectives (From admission, onward)   Start     Dose/Rate Route Frequency Ordered Stop   08/11/19 1830  metroNIDAZOLE (FLAGYL) IVPB 500 mg     500 mg 100 mL/hr over 60 Minutes Intravenous Every 8 hours 08/11/19 1641     08/11/19 1800  ciprofloxacin (CIPRO) IVPB 400 mg     400 mg 200 mL/hr over 60 Minutes Intravenous Every 12 hours 08/11/19 1641     08/11/19 1700  metroNIDAZOLE (FLAGYL) IVPB 500 mg  Status:  Discontinued     500 mg 100 mL/hr over 60 Minutes Intravenous  Once 08/11/19 1648 08/11/19 1754   08/11/19 1700  ciprofloxacin (CIPRO) IVPB 400 mg  Status:  Discontinued     400 mg 200 mL/hr over 60 Minutes Intravenous  Once 08/11/19 1650 08/11/19 1753      Subjective:  Patient seen and examined at bedside this morning.  Hemodynamically stable.  Looked better today but she is still having multiple episodes of loose stools.  Abdominal pain, nausea and vomiting are much better today.  Objective: Vitals:   08/12/19 0548 08/12/19 1511 08/12/19 2033 08/13/19 0518  BP: (!) 129/112 (!) 145/75 138/69 138/73  Pulse: 77 90 83 82  Resp: 17 18 18 18   Temp: 98.1 F (36.7 C) 99.8 F (37.7 C) 99.2 F (37.3 C) 98.8 F (37.1 C)  TempSrc:  Oral Oral Oral  SpO2: 98% 98% 97% 97%  Weight:      Height:        Intake/Output Summary (Last 24 hours) at 08/13/2019 1341 Last data filed at 08/13/2019  1043 Gross per 24 hour  Intake 480 ml  Output 4 ml  Net 476 ml   Filed Weights   08/12/19 0019  Weight: 57 kg    Examination:  General exam: Not in distress,average built HEENT:PERRL,Oral mucosa moist, Ear/Nose normal on gross exam Respiratory system: Bilateral equal air entry, normal vesicular breath sounds, no wheezes or crackles  Cardiovascular system: S1 & S2 heard, RRR. No JVD, murmurs, rubs, gallops or clicks. No pedal edema. Gastrointestinal system: Abdomen is nondistended, soft and nontender. No organomegaly  or masses felt. Normal bowel sounds heard. Central nervous system: Alert and oriented. No focal neurological deficits. Extremities: No edema, no clubbing ,no cyanosis Skin: No rashes, lesions or ulcers,no icterus ,no pallor   Data Reviewed: I have personally reviewed following labs and imaging studies  CBC: Recent Labs  Lab 08/11/19 1230 08/12/19 0541  WBC 6.9 9.2  NEUTROABS  --  6.9  HGB 13.0 11.6*  HCT 39.7 35.0*  MCV 90.6 88.6  PLT 220 893   Basic Metabolic Panel: Recent Labs  Lab 08/11/19 1230 08/12/19 0541 08/13/19 0524  NA 137 135 136  K 3.4* 3.5 3.3*  CL 102 106 106  CO2 22 17* 21*  GLUCOSE 86 101* 108*  BUN 9 6* <5*  CREATININE 0.72 0.62 0.62  CALCIUM 8.2* 7.8* 7.8*   GFR: Estimated Creatinine Clearance: 65.6 mL/min (by C-G formula based on SCr of 0.62 mg/dL). Liver Function Tests: Recent Labs  Lab 08/11/19 1230 08/12/19 0541  AST 18 13*  ALT 19 15  ALKPHOS 69 60  BILITOT 1.0 1.3*  PROT 6.5 5.7*  ALBUMIN 3.5 3.1*   Recent Labs  Lab 08/11/19 1230 08/12/19 0541  LIPASE 104* 152*   No results for input(s): AMMONIA in the last 168 hours. Coagulation Profile: No results for input(s): INR, PROTIME in the last 168 hours. Cardiac Enzymes: No results for input(s): CKTOTAL, CKMB, CKMBINDEX, TROPONINI in the last 168 hours. BNP (last 3 results) No results for input(s): PROBNP in the last 8760 hours. HbA1C: No results for input(s): HGBA1C in the last 72 hours. CBG: No results for input(s): GLUCAP in the last 168 hours. Lipid Profile: No results for input(s): CHOL, HDL, LDLCALC, TRIG, CHOLHDL, LDLDIRECT in the last 72 hours. Thyroid Function Tests: No results for input(s): TSH, T4TOTAL, FREET4, T3FREE, THYROIDAB in the last 72 hours. Anemia Panel: No results for input(s): VITAMINB12, FOLATE, FERRITIN, TIBC, IRON, RETICCTPCT in the last 72 hours. Sepsis Labs: No results for input(s): PROCALCITON, LATICACIDVEN in the last 168 hours.  Recent Results  (from the past 240 hour(s))  SARS CORONAVIRUS 2 (TAT 6-24 HRS) Nasopharyngeal Nasopharyngeal Swab     Status: None   Collection Time: 08/11/19  2:32 PM   Specimen: Nasopharyngeal Swab  Result Value Ref Range Status   SARS Coronavirus 2 NEGATIVE NEGATIVE Final    Comment: (NOTE) SARS-CoV-2 target nucleic acids are NOT DETECTED. The SARS-CoV-2 RNA is generally detectable in upper and lower respiratory specimens during the acute phase of infection. Negative results do not preclude SARS-CoV-2 infection, do not rule out co-infections with other pathogens, and should not be used as the sole basis for treatment or other patient management decisions. Negative results must be combined with clinical observations, patient history, and epidemiological information. The expected result is Negative. Fact Sheet for Patients: SugarRoll.be Fact Sheet for Healthcare Providers: https://www.woods-mathews.com/ This test is not yet approved or cleared by the [place] FDA and  has been authorized for detection and/or diagnosis of SARS-CoV-2  by FDA under an Emergency Use Authorization (EUA). This EUA will remain  in effect (meaning this test can be used) for the duration of the COVID-19 declaration under Section 56 4(b)(1) of the Act, 21 U.S.C. section 360bbb-3(b)(1), unless the authorization is terminated or revoked sooner. Performed at [facility], [place] [address] [place], [place] [address]          Radiology Studies: CT Abdomen Pelvis W Contrast  Result Date: 08/11/2019 CLINICAL DATA:  3rd emergency visit in past 5 days with a chief complaint of generalized abdominal pain and rectal bleeding. She was seen at [facility] ED on Saturday and Monday. CT scan revealed "colitis, pancreatitis, UTI". Now her rectal bleeding has increased in frequency and amount. Appointment tomorrow with [facility]. EXAM: CT ABDOMEN AND PELVIS WITH CONTRAST TECHNIQUE:  Multidetector CT imaging of the abdomen and pelvis was performed using the standard protocol following bolus administration of intravenous contrast. CONTRAST:  126m OMNIPAQUE IOHEXOL 300 MG/ML  SOLN COMPARISON:  None. FINDINGS: Lower chest: Linear dependent lung base atelectasis. No acute findings. Hepatobiliary: Liver normal in size and attenuation. No mass or focal lesion. Mild intrahepatic bile duct prominence. Common bile duct normal caliber. Is apparent increased attenuation along the gallbladder neck/cystic duct,a which could reflect small stones. Gallbladder otherwise unremarkable with no wall thickening or evidence acute cholecystitis. Pancreas: Unremarkable. No pancreatic ductal dilatation or surrounding inflammatory changes. Spleen: Normal in size without focal abnormality. Adrenals/Urinary Tract: No adrenal masses. Kidneys are normal in position. Left kidney is larger than the right. There are left renal sinus cysts. Subcentimeter low-density lesions are seen in both kidneys too small to fully characterize, but likely also cysts. There is symmetric renal enhancement and excretion. No hydronephrosis. Normal ureters. Bladder mostly decompressed but otherwise unremarkable. Stomach/Bowel: Mild wall thickening is suggested throughout the colon, with this assessment somewhat limited by lack of colonic distention. Multiple sigmoid colon diverticula, without evidence of diverticulitis. Stomach and small bowel are unremarkable. Vascular/Lymphatic: Prominent to mildly enlarged gastrohepatic ligament lymph nodes, largest measuring 1.3 cm short axis. No other prominent or enlarged lymph nodes. Aortic atherosclerosis. No aneurysm. Reproductive: Uterus and bilateral adnexa are unremarkable. Other: No abdominal wall hernia.  No ascites. Musculoskeletal: No fracture or acute finding. No osteoblastic or osteolytic lesions. IMPRESSION: 1. Mild wall thickening suggested throughout the colon. Suspect mild diffuse  inflammatory or infectious colitis. 2. There are sigmoid colon diverticula but no evidence of diverticulitis. 3. No other acute finding within the abdomen pelvis. Prominent to mildly enlarged gastrohepatic ligament lymph nodes, most likely chronic and reactive. 4. Possible small stones in the gallbladder neck and cystic duct, but no evidence of acute cholecystitis. 5. Left renal sinus cysts. 6. Aortic atherosclerosis. Electronically Signed   By: DLajean ManesM.D.   On: 08/11/2019 15:13   UKoreaAbdomen Limited RUQ  Result Date: 08/11/2019 CLINICAL DATA:  Abdominal pain for 5 days EXAM: ULTRASOUND ABDOMEN LIMITED RIGHT UPPER QUADRANT COMPARISON:  Same-day CT abdomen pelvis. FINDINGS: Gallbladder: No gallstones or wall thickening visualized. No sonographic Murphy sign noted by sonographer. Common bile duct: Diameter: Distal common bile duct is poorly visualized. Proximal bile duct measures 3.4 mm, nondilated. Liver: No focal lesion identified. Within normal limits in parenchymal echogenicity. Portal vein is patent on color Doppler imaging with normal direction of blood flow towards the liver. Other: None. IMPRESSION: Nonvisualization of the distal common bile duct. No visible cholelithiasis, choledocholithiasis, biliary dilatation or features of acute cholecystitis. Electronically Signed   By: PLovena Le  M.D.   On: 08/11/2019 19:29        Scheduled Meds: . buPROPion  150 mg Oral Daily  . feeding supplement  1 Container Oral TID BM  . multivitamin with minerals  1 tablet Oral Daily  . nicotine  14 mg Transdermal Daily   Continuous Infusions: . 0.9 % NaCl with KCl 20 mEq / L 100 mL/hr at 08/13/19 0121  . ciprofloxacin 400 mg (08/13/19 0651)  . metronidazole 500 mg (08/13/19 1043)     LOS: 1 day    Time spent: 25 mins.More than 50% of that time was spent in counseling and/or coordination of care.      Shelly Coss, MD [facility] Pager [phone]  If 7PM-7AM, please contact  night-coverage [email or web address] Password [email or web address] 08/13/2019, 1:41 PM

## 2019-08-14 DIAGNOSIS — R109 Unspecified abdominal pain: Secondary | ICD-10-CM

## 2019-08-14 DIAGNOSIS — K625 Hemorrhage of anus and rectum: Secondary | ICD-10-CM

## 2019-08-14 DIAGNOSIS — R748 Abnormal levels of other serum enzymes: Secondary | ICD-10-CM

## 2019-08-14 LAB — BASIC METABOLIC PANEL
Anion gap: 8 (ref 5–15)
BUN: <5 mg/dL — ABNORMAL LOW (ref 8–23)
CO2: 22 mmol/L (ref 22–32)
Calcium: 7.8 mg/dL — ABNORMAL LOW (ref 8.9–10.3)
Chloride: 109 mmol/L (ref 98–111)
Creatinine, Ser: 0.48 mg/dL (ref 0.44–1.00)
GFR calc Af Amer: >60 mL/min (ref >60)
GFR calc non Af Amer: >60 mL/min (ref >60)
Glucose, Bld: 117 mg/dL — ABNORMAL HIGH (ref 70–99)
Potassium: 3.6 mmol/L (ref 3.5–5.1)
Sodium: 139 mmol/L (ref 135–145)

## 2019-08-14 LAB — C-REACTIVE PROTEIN: CRP: 7.8 mg/dL — ABNORMAL HIGH (ref <1.0)

## 2019-08-14 LAB — SEDIMENTATION RATE: Sed Rate: 25 mm/hr — ABNORMAL HIGH (ref 0–22)

## 2019-08-14 NOTE — Consult Note (Signed)
Consultation  Referring Provider: Dr. Tawanna Solo   Primary Care Physician:  Isaac Bliss, Rayford Halsted, MD Primary Gastroenterologist: Dr. Ardis Hughs        Reason for Consultation: Colitis              HPI:   [name] is a 63 y.o. female with a past medical history significant for anxiety, depression, colitis, migraine headaches, palpitations and pancreatitis, who returns to the ER on 08/11/2019 with abdominal pain for a week and rectal bleeding for 3 to 4 days.    Per previous physicians notes patient went to the ER in [facility] 08/05/2019 and was discharged home after hydration and symptomatic treatment.  When she returned home she continued with pain and had multiple episodes of emesis and return to the ER on 08/07/2019 but left AMA.  Then started having hematochezia and went back to the ER but then again left.    Today, the patient explains that for at least the past month she has been having abdominal cramping and symptoms which all increased in severity 08/05/2019, she then proceeded to the ER as above, but symptoms kept getting worse and she developed new ones.  Describes starting with nausea and vomiting for 24 hours and then developing hematochezia with at least 10-12 loose stools a day.  Explains that even when she drinks water she feels an acidic feeling in her stomach and a pressure.  Nausea and abdominal pain are more under control since being hospitalized.    Does describe having problems with her GI system in the past but nothing like this.    Denies fever, chills or previous similar symptoms.     ED course: WBC 6.9, hemoglobin 13, platelets 220, CMP with a potassium of 3.4, CT scan abdomen pelvis showed mild wall thickening suggested throughout the colon which raise suspicion for mild diffuse inflammatory infectious colitis, diverticuli in the sigmoid colon without evidence of diverticulitis and a possible small stone in the gallbladder and cystic DOS, but no evidence of acute  cholecystitis, left renal sinus cyst, aortic atherosclerosis  GI history: 03/06/2019 colonoscopy, Dr. Ardis Hughs: Normal terminal ileum, no inflammation, diminutive polyp in the ascending colon, otherwise normal; at that time noted the patient's had improvement in diarrhea after a brief course of Lialda (just like it did in 2009); pathology showed benign colonic mucosa 09/06/2007 colonoscopy, Dr. Ardis Hughs: Abnormal exam, patchy colitis, single 15 mm polyp removed from the rectum ; started on Lialda 2 pills daily ;pathology with fragments of benign colonic mucosa with foci of active chronic mucosal inflammation, adenomatous polyps  Past Medical History:  Diagnosis Date  . Anxiety   . Colitis   . Depression   . Migraine   . Palpitations   . Pancreatitis     Past Surgical History:  Procedure Laterality Date  . APPENDECTOMY Right   . TONSILLECTOMY      Family History  Problem Relation Age of Onset  . Prostate cancer Father   . Hypertension Mother     Social History   Tobacco Use  . Smoking status: Current Every Day Smoker    Packs/day: 1.00    Types: Cigarettes  . Smokeless tobacco: Never Used  . Tobacco comment: quit date: 04/23/2015. ADE with Chantix  Substance Use Topics  . Alcohol use: Yes    Alcohol/week: 0.0 standard drinks  . Drug use: No    Prior to Admission medications   Medication Sig Start Date End Date Taking? Authorizing Provider  ALPRAZolam (XANAX) 0.25 MG tablet Take 1 tablet (0.25 mg total) by mouth at bedtime as needed for anxiety. TAKE 1 TABLET BY MOUTH TWICE A DAY AS NEEDED 07/12/19  Yes Isaac Bliss, Rayford Halsted, MD  buPROPion (WELLBUTRIN XL) 150 MG 24 hr tablet TAKE 1 TABLET BY MOUTH EVERY DAY 06/28/19  Yes Isaac Bliss, Rayford Halsted, MD  ciprofloxacin (CIPRO) 500 MG tablet Take 500 mg by mouth every 12 (twelve) hours. 08/06/19 08/16/19 Yes [provider]  metroNIDAZOLE (FLAGYL) 500 MG tablet Take 500 mg by mouth 2 (two) times daily. 08/06/19 08/16/19  Yes [provider]  Multiple Vitamin (MULTIVITAMIN) tablet Take 1 tablet by mouth daily.   Yes [provider]    Current Facility-Administered Medications  Medication Dose Route Frequency Provider Last Rate Last Admin  . 0.9 % NaCl with KCl 20 mEq/ L  infusion   Intravenous Continuous Shelly Coss, MD 100 mL/hr at 08/14/19 0515 New Bag at 08/14/19 0515  . ALPRAZolam Duanne Moron) tablet 0.25 mg  0.25 mg Oral QHS PRN Reubin Milan, MD   0.25 mg at 08/13/19 2223  . buPROPion (WELLBUTRIN XL) 24 hr tablet 150 mg  150 mg Oral Daily Reubin Milan, MD   150 mg at 08/13/19 1040  . ciprofloxacin (CIPRO) IVPB 400 mg  400 mg Intravenous Q12H Reubin Milan, MD 200 mL/hr at 08/14/19 0518 400 mg at 08/14/19 0518  . feeding supplement (BOOST / RESOURCE BREEZE) liquid 1 Container  1 Container Oral TID BM Reubin Milan, MD   1 Container at 08/12/19 2241  . fentaNYL (SUBLIMAZE) injection 50 mcg  50 mcg Intravenous Q2H PRN Reubin Milan, MD   50 mcg at 08/12/19 1547  . metroNIDAZOLE (FLAGYL) IVPB 500 mg  500 mg Intravenous Q8H Reubin Milan, MD 100 mL/hr at 08/14/19 0242 500 mg at 08/14/19 0242  . multivitamin with minerals tablet 1 tablet  1 tablet Oral Daily Shelly Coss, MD   1 tablet at 08/13/19 1040  . nicotine (NICODERM CQ - dosed in mg/24 hours) patch 14 mg  14 mg Transdermal Daily Reubin Milan, MD   14 mg at 08/11/19 1933  . ondansetron (ZOFRAN) tablet 4 mg  4 mg Oral Q6H PRN Reubin Milan, MD       Or  . ondansetron [facility]) injection 4 mg  4 mg Intravenous Q6H PRN Reubin Milan, MD   4 mg at 08/13/19 [phone]  . promethazine (PHENERGAN) injection 12.5 mg  12.5 mg Intravenous Q6H PRN Shelly Coss, MD   12.5 mg at 08/12/19 2241    Allergies as of 08/11/2019  . (No Known Allergies)     Review of Systems:    Constitutional: No weight loss, fever or chills Skin: No rash  Cardiovascular: No chest pain Respiratory: No SOB    Gastrointestinal: See HPI and otherwise negative Genitourinary: No dysuria  Neurological: No headache, dizziness or syncope Musculoskeletal: No new muscle or joint pain Hematologic: No bruising Psychiatric: No history of depression or anxiety    Physical Exam:  Vital signs in last 24 hours: Temp:  [98.3 F (36.8 C)-98.7 F (37.1 C)] 98.4 F (36.9 C) (12/20 0602) Pulse Rate:  [76-80] 76 (12/20 0602) Resp:  [18-19] 19 (12/20 0602) BP: (131-138)/(64-71) 131/65 (12/20 0602) SpO2:  [97 %-98 %] 97 % (12/20 0602) Last BM Date: 08/13/19 General:   Pleasant Caucasian female appears to be in NAD, Well developed, Well nourished, alert and cooperative Head:  Normocephalic and atraumatic.  Eyes:   PEERL, EOMI. No icterus. Conjunctiva pink. Ears:  Normal auditory acuity. Neck:  Supple Throat: Oral cavity and pharynx without inflammation, swelling or lesion.  Lungs: Respirations even and unlabored. Lungs clear to auscultation bilaterally.   No wheezes, crackles, or rhonchi.  Heart: Normal S1, S2. No MRG. Regular rate and rhythm. No peripheral edema, cyanosis or pallor.  Abdomen:  Soft, mild generalized abdominal ttp, nondistended. No rebound or guarding. Normal bowel sounds. No appreciable masses or hepatomegaly. Rectal:  Not performed.  Msk:  Symmetrical without gross deformities. Peripheral pulses intact.  Extremities:  Without edema, no deformity or joint abnormality.  Neurologic:  Alert and  oriented x4;  grossly normal neurologically.  Skin:   Dry and intact without significant lesions or rashes. Psychiatric: Demonstrates good judgement and reason without abnormal affect or behaviors.   LAB RESULTS: Recent Labs    08/11/19 1230 08/12/19 0541  WBC 6.9 9.2  HGB 13.0 11.6*  HCT 39.7 35.0*  PLT 220 203   BMET Recent Labs    08/12/19 0541 08/13/19 0524 08/14/19 0540  NA 135 136 139  K 3.5 3.3* 3.6  CL 106 106 109  CO2 17* 21* 22  GLUCOSE 101* 108* 117*  BUN 6* <5* <5*   CREATININE 0.62 0.62 0.48  CALCIUM 7.8* 7.8* 7.8*   LFT Recent Labs    08/12/19 0541  PROT 5.7*  ALBUMIN 3.1*  AST 13*  ALT 15  ALKPHOS 60  BILITOT 1.3*     Impression / Plan:   Impression: 1.  Colitis: CT with mild wall thickening suggested throughout the colon, GI pathogen panel pending, patient with history of possible IBD in the past with symptoms of diarrhea which resolved after short courses of Lialda; question infectious versus inflammatory cause 2.  Anemia: With the above 3.  Elevated lipase: Likely due to inflammation in the area, CT not showing any signs of pancreatitis, patient with no abdominal pain today  Plan: 1.  Agree with stool studies including GI pathogen panel. 2.  If the above returned negative/normal would recommend that we proceed with EGD and colonoscopy given patient's nausea and vomiting as well as diarrhea to look for signs of IBD.  Did discuss this with the patient today and she is willing to proceed when we can.  Earliest this would be is likely Tuesday if we can get the stool study results back. 3.  Continue empiric antibiotics for now 4.  Ordered ESR, CRP and fecal calprotectin 5.  Please await any further recommendations from Dr. Bryan Lemma later today.  Thank you for your kind consultation, we will continue to follow.  [name]  08/14/2019, 10:22 AM

## 2019-08-14 NOTE — Progress Notes (Signed)
PROGRESS NOTE    [name]  IDC:[id number] DOB: 1956/01/20 DOA: 08/11/2019 PCP: Isaac Bliss, Rayford Halsted, MD   Brief Narrative:  Patient is a 63 year old female with history of anxiety, depression, headaches, history of pancreatitis who presented to the emergency department from home with complaints of abdominal pain, rectal bleeding, nausea, vomiting, diarrhea.  This has been going on for last 1 week.  On presentation she was hemodynamically stable.  Hemoglobin was stable.  She looked dehydrated on presentation and was started on IV fluids.  CT abdomen/pelvis showed mild wall thickening of the colon suggestive of diffuse inflammatory versus infectious colitis.  Started on IV antibiotics.  She is still complaining of diarrhea this morning.  Assessment & Plan:   Principal Problem:   Acute pancreatitis Active Problems:   Abdominal pain   Colitis   Hypokalemia   Anxiety with depression   Elevated lipase   Rectal bleeding   Acute colitis: CT abdomen and pelvis showed mild wall thickening of the colon.  Inflammatory vs  infectious colitis.  Started on antibiotics.  We have sent GI pathogen panel.CRP elevated, ESR mildly elevated No evidence of pancreatitis as per CT abdomen/pelvis.  Ultrasound of the gallbladder did not show any cholelithiasis or cholecystitis. GI following.  Abdominal pain, nausea, vomiting, diarrhea: Still having multiple episodes of loose non bloody  bowel stools.  Abdomen pain, nausea or vomiting are better.  Continue IV fluids.Will advance the diet to soft.  Hematochezia: Patient also reported hematochezia but her hemoglobin is currently stable.  Not a candidate for colonoscopy at this point.  Currently she does not have any active rectal bleeding.  Continue to monitor CBC.  Anxiety with depression: On Wellbutrin and Xanax at home   Nutrition Problem: Inadequate oral intake Etiology: acute illness      DVT prophylaxis:SCD Code Status: Full Family  Communication: Husband at the bed side on 12/20  Disposition Plan: Likely home in the next 24 to 48 hours   Consultants: None  Procedures:None  Antimicrobials:  Anti-infectives (From admission, onward)   Start     Dose/Rate Route Frequency Ordered Stop   08/11/19 1830  metroNIDAZOLE (FLAGYL) IVPB 500 mg     500 mg 100 mL/hr over 60 Minutes Intravenous Every 8 hours 08/11/19 1641     08/11/19 1800  ciprofloxacin (CIPRO) IVPB 400 mg     400 mg 200 mL/hr over 60 Minutes Intravenous Every 12 hours 08/11/19 1641     08/11/19 1700  metroNIDAZOLE (FLAGYL) IVPB 500 mg  Status:  Discontinued     500 mg 100 mL/hr over 60 Minutes Intravenous  Once 08/11/19 1648 08/11/19 1754   08/11/19 1700  ciprofloxacin (CIPRO) IVPB 400 mg  Status:  Discontinued     400 mg 200 mL/hr over 60 Minutes Intravenous  Once 08/11/19 1650 08/11/19 1753      Subjective:  Patient seen and examined at bedside this morning.  Hemodynamically stable.  Continues to have loose stools but less frequent than yesterday.  She is emotionally distressed this morning after hearing the news about diagnosis of cancer on her father-in-law.  Objective: Vitals:   08/13/19 0518 08/13/19 1434 08/13/19 2042 08/14/19 0602  BP: 138/73 138/71 133/64 131/65  Pulse: 82 80 79 76  Resp: _0 Temp: 98.8 F (37.1 C) 98.3 F (36.8 C) 98.7 F (37.1 C) 98.4 F (36.9 C)  TempSrc: Oral Oral Oral Oral  SpO2: 97% 98% 97% 97%  Weight:  Height:        Intake/Output Summary (Last 24 hours) at 08/14/2019 1317 Last data filed at 08/13/2019 1900 Gross per 24 hour  Intake 1200 ml  Output 4 ml  Net 1196 ml   Filed Weights   08/12/19 0019  Weight: 57 kg    Examination:  General exam: Not in distress,average built HEENT:PERRL,Oral mucosa moist, Ear/Nose normal on gross exam Respiratory system: Bilateral equal air entry, normal vesicular breath sounds, no wheezes or crackles  Cardiovascular system: S1 & S2 heard, RRR. No  JVD, murmurs, rubs, gallops or clicks. No pedal edema. Gastrointestinal system: Abdomen is nondistended, soft and nontender. No organomegaly or masses felt. Normal bowel sounds heard. Central nervous system: Alert and oriented. No focal neurological deficits. Extremities: No edema, no clubbing ,no cyanosis Skin: No rashes, lesions or ulcers,no icterus ,no pallor   Data Reviewed: I have personally reviewed following labs and imaging studies  CBC: Recent Labs  Lab 08/11/19 1230 08/12/19 0541  WBC 6.9 9.2  NEUTROABS  --  6.9  HGB 13.0 11.6*  HCT 39.7 35.0*  MCV 90.6 88.6  PLT 220 235   Basic Metabolic Panel: Recent Labs  Lab 08/11/19 1230 08/12/19 0541 08/13/19 0524 08/14/19 0540  NA 137 135 136 139  K 3.4* 3.5 3.3* 3.6  CL 102 106 106 109  CO2 22 17* 21* 22  GLUCOSE 86 101* 108* 117*  BUN 9 6* <5* <5*  CREATININE 0.72 0.62 0.62 0.48  CALCIUM 8.2* 7.8* 7.8* 7.8*   GFR: Estimated Creatinine Clearance: 65.6 mL/min (by C-G formula based on SCr of 0.48 mg/dL). Liver Function Tests: Recent Labs  Lab 08/11/19 1230 08/12/19 0541  AST 18 13*  ALT 19 15  ALKPHOS 69 60  BILITOT 1.0 1.3*  PROT 6.5 5.7*  ALBUMIN 3.5 3.1*   Recent Labs  Lab 08/11/19 1230 08/12/19 0541  LIPASE 104* 152*   No results for input(s): AMMONIA in the last 168 hours. Coagulation Profile: No results for input(s): INR, PROTIME in the last 168 hours. Cardiac Enzymes: No results for input(s): CKTOTAL, CKMB, CKMBINDEX, TROPONINI in the last 168 hours. BNP (last 3 results) No results for input(s): PROBNP in the last 8760 hours. HbA1C: No results for input(s): HGBA1C in the last 72 hours. CBG: No results for input(s): GLUCAP in the last 168 hours. Lipid Profile: No results for input(s): CHOL, HDL, LDLCALC, TRIG, CHOLHDL, LDLDIRECT in the last 72 hours. Thyroid Function Tests: No results for input(s): TSH, T4TOTAL, FREET4, T3FREE, THYROIDAB in the last 72 hours. Anemia Panel: No results for  input(s): VITAMINB12, FOLATE, FERRITIN, TIBC, IRON, RETICCTPCT in the last 72 hours. Sepsis Labs: No results for input(s): PROCALCITON, LATICACIDVEN in the last 168 hours.  Recent Results (from the past 240 hour(s))  SARS CORONAVIRUS 2 (TAT 6-24 HRS) Nasopharyngeal Nasopharyngeal Swab     Status: None   Collection Time: 08/11/19  2:32 PM   Specimen: Nasopharyngeal Swab  Result Value Ref Range Status   SARS Coronavirus 2 NEGATIVE NEGATIVE Final    Comment: (NOTE) SARS-CoV-2 target nucleic acids are NOT DETECTED. The SARS-CoV-2 RNA is generally detectable in upper and lower respiratory specimens during the acute phase of infection. Negative results do not preclude SARS-CoV-2 infection, do not rule out co-infections with other pathogens, and should not be used as the sole basis for treatment or other patient management decisions. Negative results must be combined with clinical observations, patient history, and epidemiological information. The expected result is Negative. Fact Sheet for Patients:  SugarRoll.be Fact Sheet for Healthcare Providers: https://www.woods-mathews.com/ This test is not yet approved or cleared by the [place] FDA and  has been authorized for detection and/or diagnosis of SARS-CoV-2 by FDA under an Emergency Use Authorization (EUA). This EUA will remain  in effect (meaning this test can be used) for the duration of the COVID-19 declaration under Section 56 4(b)(1) of the Act, 21 U.S.C. section 360bbb-3(b)(1), unless the authorization is terminated or revoked sooner. Performed at [facility], [place] [address] [place],  [address]          Radiology Studies: No results found.      Scheduled Meds: . buPROPion  150 mg Oral Daily  . feeding supplement  1 Container Oral TID BM  . multivitamin with minerals  1 tablet Oral Daily  . nicotine  14 mg Transdermal Daily   Continuous Infusions: . 0.9 %  NaCl with KCl 20 mEq / L 100 mL/hr at 08/14/19 0515  . ciprofloxacin 400 mg (08/14/19 0518)  . metronidazole 500 mg (08/14/19 1124)     LOS: 2 days    Time spent: 25 mins.More than 50% of that time was spent in counseling and/or coordination of care.      Shelly Coss, MD [facility] Pager [phone]  If 7PM-7AM, please contact night-coverage [email or web address] Password [email or web address] 08/14/2019, 1:17 PM

## 2019-08-15 LAB — BASIC METABOLIC PANEL
Anion gap: 7 (ref 5–15)
BUN: <5 mg/dL — ABNORMAL LOW (ref 8–23)
CO2: 22 mmol/L (ref 22–32)
Calcium: 7.7 mg/dL — ABNORMAL LOW (ref 8.9–10.3)
Chloride: 108 mmol/L (ref 98–111)
Creatinine, Ser: 0.62 mg/dL (ref 0.44–1.00)
GFR calc Af Amer: >60 mL/min (ref >60)
GFR calc non Af Amer: >60 mL/min (ref >60)
Glucose, Bld: 111 mg/dL — ABNORMAL HIGH (ref 70–99)
Potassium: 3.7 mmol/L (ref 3.5–5.1)
Sodium: 137 mmol/L (ref 135–145)

## 2019-08-15 LAB — CBC WITH DIFFERENTIAL/PLATELET
Abs Immature Granulocytes: 0.04 10*3/uL (ref 0.00–0.07)
Basophils Absolute: 0.1 10*3/uL (ref 0.0–0.1)
Basophils Relative: 1 %
Eosinophils Absolute: 0.1 10*3/uL (ref 0.0–0.5)
Eosinophils Relative: 1 %
HCT: 32.6 % — ABNORMAL LOW (ref 36.0–46.0)
Hemoglobin: 10.8 g/dL — ABNORMAL LOW (ref 12.0–15.0)
Immature Granulocytes: 1 %
Lymphocytes Relative: 18 %
Lymphs Abs: 1.6 10*3/uL (ref 0.7–4.0)
MCH: 29.5 pg (ref 26.0–34.0)
MCHC: 33.1 g/dL (ref 30.0–36.0)
MCV: 89.1 fL (ref 80.0–100.0)
Monocytes Absolute: 0.8 10*3/uL (ref 0.1–1.0)
Monocytes Relative: 9 %
Neutro Abs: 6.2 10*3/uL (ref 1.7–7.7)
Neutrophils Relative %: 70 %
Platelets: 223 10*3/uL (ref 150–400)
RBC: 3.66 MIL/uL — ABNORMAL LOW (ref 3.87–5.11)
RDW: 13.8 % (ref 11.5–15.5)
WBC: 8.8 10*3/uL (ref 4.0–10.5)
nRBC: 0 % (ref 0.0–0.2)

## 2019-08-15 MED ORDER — ZOLPIDEM TARTRATE 5 MG PO TABS
5.0000 mg | ORAL_TABLET | Freq: Every evening | ORAL | Status: DC | PRN
  Administered 2019-08-15 – 2019-08-16 (×2): 5 mg via ORAL
  Filled 2019-08-15 (×2): qty 1

## 2019-08-15 NOTE — Progress Notes (Signed)
PROGRESS NOTE    [name]  GNF:[id number] DOB: 08/03/1956 DOA: 08/11/2019 PCP: Isaac Bliss, Rayford Halsted, MD   Brief Narrative:  Patient is a 63 year old female with history of anxiety, depression, headaches, history of pancreatitis who presented to the emergency department from home with complaints of abdominal pain, rectal bleeding, nausea, vomiting, diarrhea.  This has been going on for last 1 week.  On presentation she was hemodynamically stable.  Hemoglobin was stable.  She looked dehydrated on presentation and was started on IV fluids.  CT abdomen/pelvis showed mild wall thickening of the colon suggestive of diffuse inflammatory versus infectious colitis.  Started on IV antibiotics.  Awaiting GI pathogen panel.  Assessment & Plan:   Principal Problem:   Acute pancreatitis Active Problems:   Abdominal pain   Colitis   Hypokalemia   Anxiety with depression   Elevated lipase   Rectal bleeding   Acute colitis: CT abdomen and pelvis showed mild wall thickening of the colon.  Inflammatory vs  infectious colitis.  Started on antibiotics.  We have sent GI pathogen panel which is pending.CRP elevated, ESR mildly elevated No evidence of pancreatitis as per CT abdomen/pelvis.  Ultrasound of the gallbladder did not show any cholelithiasis or cholecystitis. GI following.  Plan for EGD/colonoscopy during this hospitalization if GI pathogen panel comes so to negative.  Abdominal pain, nausea, vomiting, diarrhea: Still having multiple episodes of loose non bloody  bowel stools.  Abdomen pain, nausea or vomiting are better.  Continue IV fluids.Continue  Soft diet.  Hematochezia: Patient also reported hematochezia at home but her hemoglobin is currently stable.  Currently her stools are nonbloody.  Currently she does not have any active rectal bleeding.  Continue to monitor CBC.  Anxiety with depression: On Wellbutrin and Xanax at home   Nutrition Problem: Inadequate oral  intake Etiology: acute illness      DVT prophylaxis:SCD Code Status: Full Family Communication: Husband at the bed side on 12/20  Disposition Plan: Home after full work-up   Consultants: None  Procedures:None  Antimicrobials:  Anti-infectives (From admission, onward)   Start     Dose/Rate Route Frequency Ordered Stop   08/11/19 1830  metroNIDAZOLE (FLAGYL) IVPB 500 mg     500 mg 100 mL/hr over 60 Minutes Intravenous Every 8 hours 08/11/19 1641     08/11/19 1800  ciprofloxacin (CIPRO) IVPB 400 mg     400 mg 200 mL/hr over 60 Minutes Intravenous Every 12 hours 08/11/19 1641     08/11/19 1700  metroNIDAZOLE (FLAGYL) IVPB 500 mg  Status:  Discontinued     500 mg 100 mL/hr over 60 Minutes Intravenous  Once 08/11/19 1648 08/11/19 1754   08/11/19 1700  ciprofloxacin (CIPRO) IVPB 400 mg  Status:  Discontinued     400 mg 200 mL/hr over 60 Minutes Intravenous  Once 08/11/19 1650 08/11/19 1753      Subjective:  Patient seen and examined at bedside this morning.  She appears much better today.  Eating her food and tolerating diet.  Denies any abdominal pain.  Had 4 episodes of loose bowel movement this morning  Objective: Vitals:   08/14/19 0602 08/14/19 1502 08/14/19 2041 08/15/19 0525  BP: 131/65 (!) 121/56 131/66 (!) 134/59  Pulse: 76 77 85 86  Resp: _0 Temp: 98.4 F (36.9 C) 98.5 F (36.9 C) 98.8 F (37.1 C) 98.5 F (36.9 C)  TempSrc: Oral Oral Oral Oral  SpO2: 97% 97% 97% 96%  Weight:  Height:        Intake/Output Summary (Last 24 hours) at 08/15/2019 1350 Last data filed at 08/15/2019 0442 Gross per 24 hour  Intake 4542.73 ml  Output 2 ml  Net 4540.73 ml   Filed Weights   08/12/19 0019  Weight: 57 kg    Examination:  General exam: Not in distress,average built HEENT:PERRL,Oral mucosa moist, Ear/Nose normal on gross exam Respiratory system: Bilateral equal air entry, normal vesicular breath sounds, no wheezes or crackles  Cardiovascular  system: S1 & S2 heard, RRR. No JVD, murmurs, rubs, gallops or clicks. No pedal edema. Gastrointestinal system: Abdomen is nondistended, soft and nontender. No organomegaly or masses felt. Normal bowel sounds heard. Central nervous system: Alert and oriented. No focal neurological deficits. Extremities: No edema, no clubbing ,no cyanosis Skin: No rashes, lesions or ulcers,no icterus ,no pallor   Data Reviewed: I have personally reviewed following labs and imaging studies  CBC: Recent Labs  Lab 08/11/19 1230 08/12/19 0541 08/15/19 0533  WBC 6.9 9.2 8.8  NEUTROABS  --  6.9 6.2  HGB 13.0 11.6* 10.8*  HCT 39.7 35.0* 32.6*  MCV 90.6 88.6 89.1  PLT 220 203 268   Basic Metabolic Panel: Recent Labs  Lab 08/11/19 1230 08/12/19 0541 08/13/19 0524 08/14/19 0540 08/15/19 0533  NA 137 135 136 139 137  K 3.4* 3.5 3.3* 3.6 3.7  CL 102 106 106 109 108  CO2 22 17* 21* 22 22  GLUCOSE 86 101* 108* 117* 111*  BUN 9 6* <5* <5* <5*  CREATININE 0.72 0.62 0.62 0.48 0.62  CALCIUM 8.2* 7.8* 7.8* 7.8* 7.7*   GFR: Estimated Creatinine Clearance: 65.6 mL/min (by C-G formula based on SCr of 0.62 mg/dL). Liver Function Tests: Recent Labs  Lab 08/11/19 1230 08/12/19 0541  AST 18 13*  ALT 19 15  ALKPHOS 69 60  BILITOT 1.0 1.3*  PROT 6.5 5.7*  ALBUMIN 3.5 3.1*   Recent Labs  Lab 08/11/19 1230 08/12/19 0541  LIPASE 104* 152*   No results for input(s): AMMONIA in the last 168 hours. Coagulation Profile: No results for input(s): INR, PROTIME in the last 168 hours. Cardiac Enzymes: No results for input(s): CKTOTAL, CKMB, CKMBINDEX, TROPONINI in the last 168 hours. BNP (last 3 results) No results for input(s): PROBNP in the last 8760 hours. HbA1C: No results for input(s): HGBA1C in the last 72 hours. CBG: No results for input(s): GLUCAP in the last 168 hours. Lipid Profile: No results for input(s): CHOL, HDL, LDLCALC, TRIG, CHOLHDL, LDLDIRECT in the last 72 hours. Thyroid Function  Tests: No results for input(s): TSH, T4TOTAL, FREET4, T3FREE, THYROIDAB in the last 72 hours. Anemia Panel: No results for input(s): VITAMINB12, FOLATE, FERRITIN, TIBC, IRON, RETICCTPCT in the last 72 hours. Sepsis Labs: No results for input(s): PROCALCITON, LATICACIDVEN in the last 168 hours.  Recent Results (from the past 240 hour(s))  SARS CORONAVIRUS 2 (TAT 6-24 HRS) Nasopharyngeal Nasopharyngeal Swab     Status: None   Collection Time: 08/11/19  2:32 PM   Specimen: Nasopharyngeal Swab  Result Value Ref Range Status   SARS Coronavirus 2 NEGATIVE NEGATIVE Final    Comment: (NOTE) SARS-CoV-2 target nucleic acids are NOT DETECTED. The SARS-CoV-2 RNA is generally detectable in upper and lower respiratory specimens during the acute phase of infection. Negative results do not preclude SARS-CoV-2 infection, do not rule out co-infections with other pathogens, and should not be used as the sole basis for treatment or other patient management decisions. Negative results must be  combined with clinical observations, patient history, and epidemiological information. The expected result is Negative. Fact Sheet for Patients: SugarRoll.be Fact Sheet for Healthcare Providers: https://www.woods-mathews.com/ This test is not yet approved or cleared by the [place] FDA and  has been authorized for detection and/or diagnosis of SARS-CoV-2 by FDA under an Emergency Use Authorization (EUA). This EUA will remain  in effect (meaning this test can be used) for the duration of the COVID-19 declaration under Section 56 4(b)(1) of the Act, 21 U.S.C. section 360bbb-3(b)(1), unless the authorization is terminated or revoked sooner. Performed at [facility], [place] [address], [place], [place] [address]          Radiology Studies: No results found.      Scheduled Meds: . buPROPion  150 mg Oral Daily  . feeding supplement  1 Container Oral TID  BM  . multivitamin with minerals  1 tablet Oral Daily  . nicotine  14 mg Transdermal Daily   Continuous Infusions: . 0.9 % NaCl with KCl 20 mEq / L 100 mL/hr at 08/15/19 0845  . ciprofloxacin 400 mg (08/15/19 0528)  . metronidazole 500 mg (08/15/19 1013)     LOS: 3 days    Time spent: 25 mins.More than 50% of that time was spent in counseling and/or coordination of care.      Shelly Coss, MD [facility] Pager [phone]  If 7PM-7AM, please contact night-coverage [email or web address] Password [email or web address] 08/15/2019, 1:50 PM

## 2019-08-15 NOTE — Progress Notes (Addendum)
     Progress Note    ASSESSMENT AND PLAN:    1. Abdominal pain, diarrhea, rectal bleeding, nausea / vomiting and colitis by CT scan. Possible hx of IBD with symptoms resolving after short course of Lialda. Rule out infectious etiology. Rule out IBD.  -continue empiric antibiotics for now. WBC normal.  -Awaiting stool studies.  -Fecal calprotectin, ESR pending. CRP ~ 7.  -If stool studies negative then recommend EGD and colonoscopy with random biopsies for r/o IBD ( EGD to rule out upper tract Crohn's, especially given the anemia).   2. Elevated Lipase, no pancreatitis on CT scan. Monitor for now. Abdominal pain is better.      SUBJECTIVE    Diarrhea improving. Only occasional mild abdominal discomfort and nausea / vomiting has resolved. Tolerating solids.    OBJECTIVE:     Vital signs in last 24 hours: Temp:  [98.5 F (36.9 C)-98.8 F (37.1 C)] 98.5 F (36.9 C) (12/21 0525) Pulse Rate:  [77-86] 86 (12/21 0525) Resp:  [12-20] 20 (12/21 0525) BP: (121-134)/(56-66) 134/59 (12/21 0525) SpO2:  [96 %-97 %] 96 % (12/21 0525) Last BM Date: 08/15/19 General:   Alert, well-developed female in NAD EENT:  Normal hearing, non icteric sclera   Heart:  Regular rate and rhythm;  No lower extremity edema   Pulm: Normal respiratory effort   Abdomen:  Soft, nondistended, nontender.  Normal bowel sounds.          Neurologic:  Alert and  oriented x4;  grossly normal neurologically. Psych:  Pleasant, cooperative.  Normal mood and affect.   Intake/Output from previous day: 12/20 0701 - 12/21 0700 In: 4542.7 [I.V.:2020.7; IV Piggyback:2522.1] Out: 2 [Urine:1; Stool:1] Intake/Output this shift: No intake/output data recorded.  Lab Results: Recent Labs    08/15/19 0533  WBC 8.8  HGB 10.8*  HCT 32.6*  PLT 223   BMET Recent Labs    08/13/19 0524 08/14/19 0540 08/15/19 0533  NA 136 139 137  K 3.3* 3.6 3.7  CL 106 109 108  CO2 21* 22 22  GLUCOSE 108* 117* 111*  BUN <5* <5*  <5*  CREATININE 0.62 0.48 0.62  CALCIUM 7.8* 7.8* 7.7*      Principal Problem:   Acute pancreatitis Active Problems:   Abdominal pain   Colitis   Hypokalemia   Anxiety with depression   Elevated lipase   Rectal bleeding     LOS: 3 days   [name] ,NP 08/15/2019, 9:32 AM    Attending physician's note   I have taken an interval history, reviewed the chart and examined the patient. I agree with the Advanced Practitioner's note, impression and recommendations.   Colitis ?etiology.  Infectious vs inflammatory. Prev H/O ?IBD, resolved after short course of Lialda. H/O N/V with mild anemia, elevated CRP.  Plan: -Follow stool studies (still pending) -If neg, EGD/colon as inpatient.  Explained risks and benefits.  -Continue supportive treatment for now -Tolerating p.o. well. -D/w patient in detail.   Carmell Austria, MD [facility] Fabienne Bruns [phone].

## 2019-08-16 LAB — GI PATHOGEN PANEL BY PCR, STOOL

## 2019-08-16 LAB — BASIC METABOLIC PANEL
Anion gap: 7 (ref 5–15)
BUN: <5 mg/dL — ABNORMAL LOW (ref 8–23)
CO2: 23 mmol/L (ref 22–32)
Calcium: 7.6 mg/dL — ABNORMAL LOW (ref 8.9–10.3)
Chloride: 108 mmol/L (ref 98–111)
Creatinine, Ser: 0.65 mg/dL (ref 0.44–1.00)
GFR calc Af Amer: >60 mL/min (ref >60)
GFR calc non Af Amer: >60 mL/min (ref >60)
Glucose, Bld: 122 mg/dL — ABNORMAL HIGH (ref 70–99)
Potassium: 3.6 mmol/L (ref 3.5–5.1)
Sodium: 138 mmol/L (ref 135–145)

## 2019-08-16 NOTE — Progress Notes (Addendum)
     Progress Note    ASSESSMENT AND PLAN:   1. Colitis with N/V/ abd pain / diarrhea with blood and anemia. . Etiology not yet determined. Stool studies still pending to evaluate for infectious etiologies. IBD on list of DDx. (possibly has history of IBD)  -continue empiric cipro / flagyl for now. WBC normal.  -stool studies still pending.  -Fecal calprotectin pending. CRP ~ 7.  -If stool studies negative then recommend EGD and colonoscopy with random biopsies for r/o IBD ( EGD to rule out upper tract Crohn's, especially given the anemia). Hopiing stool studies will result today so will put on clears going forward in anticipation of get negative results later today.   2. Elevated Lipase, no pancreatitis on CT scan. Monitor for now. Abdominal pain is better.      SUBJECTIVE   Still having frequent diarrhea, had blood in stool this am. Agreeable to EGD / colonoscopy if stool studies return negative.    OBJECTIVE:     Vital signs in last 24 hours: Temp:  [98.3 F (36.8 C)-98.6 F (37 C)] 98.4 F (36.9 C) (12/22 0522) Pulse Rate:  [78-88] 78 (12/22 0522) Resp:  [16-18] 16 (12/22 0522) BP: (131-134)/(67-77) 134/68 (12/22 0522) SpO2:  [96 %-98 %] 96 % (12/22 0522) Last BM Date: 08/15/19 General:   Alert, well-developed female in NAD EENT:  Normal hearing, non icteric sclera   Heart:  Regular rate and rhythm;  No lower extremity edema   Pulm: Normal respiratory effort   Abdomen:  Soft, nondistended, nontender.  Normal bowel sounds.          Neurologic:  Alert and  oriented x4;  grossly normal neurologically. Psych:  Pleasant, cooperative.  Normal mood and affect.   Intake/Output from previous day: 12/21 0701 - 12/22 0700 In: 1168.7 [I.V.:817.9; IV Piggyback:350.7] Out: -  Intake/Output this shift: No intake/output data recorded.  Lab Results: Recent Labs    08/15/19 0533  WBC 8.8  HGB 10.8*  HCT 32.6*  PLT 223   BMET Recent Labs    08/14/19 0540  08/15/19 0533 08/16/19 0557  NA 139 137 138  K 3.6 3.7 3.6  CL 109 108 108  CO2 22 22 23   GLUCOSE 117* 111* 122*  BUN <5* <5* <5*  CREATININE 0.48 0.62 0.65  CALCIUM 7.8* 7.7* 7.6*      Principal Problem:   Acute pancreatitis Active Problems:   Abdominal pain   Colitis   Hypokalemia   Anxiety with depression   Elevated lipase   Rectal bleeding     LOS: 4 days   Tye Savoy ,NP 08/16/2019, 8:48 AM    Attending physician's note   I have taken an interval history, reviewed the chart and examined the patient. I agree with the Advanced Practitioner's note, impression and recommendations.   Colitis ?etiology.  Infectious vs inflammatory. Prev H/O ?IBD, resolved after short course of Lialda. H/O N/V with mild anemia, elevated CRP.  Plan: -Follow stool studies (still pending, expected to be back today) -If neg, EGD/colon  likely tomorrow.  Explained risks and benefits.  -Continue supportive treatment for now  Carmell Austria, MD [facility] [phone].

## 2019-08-16 NOTE — Progress Notes (Addendum)
PROGRESS NOTE    [name]  YKD:[id number] DOB: [birth year] DOA: 08/11/2019 PCP: Isaac Bliss, Rayford Halsted, MD   Brief Narrative:  Patient is a 63 year old female with history of anxiety, depression, headaches, history of pancreatitis who presented to the emergency department from home with complaints of abdominal pain, rectal bleeding, nausea, vomiting, diarrhea.  This has been going on for last 1 week.  On presentation she was hemodynamically stable.  Hemoglobin was stable.  She looked dehydrated on presentation and was started on IV fluids.  CT abdomen/pelvis showed mild wall thickening of the colon suggestive of diffuse inflammatory versus infectious colitis.  Started on IV antibiotics.  Awaiting GI pathogen panel.Plan for EGD/Colonocopy.  Assessment & Plan:   Principal Problem:   Acute pancreatitis Active Problems:   Abdominal pain   Colitis   Hypokalemia   Anxiety with depression   Elevated lipase   Rectal bleeding   Acute colitis: CT abdomen and pelvis showed mild wall thickening of the colon.  Inflammatory vs  infectious colitis.  Started on antibiotics.  We have sent GI pathogen panel which is pending.CRP elevated, ESR mildly elevated No evidence of pancreatitis as per CT abdomen/pelvis.  Ultrasound of the gallbladder did not show any cholelithiasis or cholecystitis. GI following.  Plan for EGD/colonoscopy during this hospitalization if GI pathogen panel comes out to be negative.  GI pathogen panel still pending.  Abdominal pain, nausea, vomiting, diarrhea: Still having multiple episodes of loose non bloody  bowel stools.  Abdomen pain, nausea or vomiting are better.  Continue IV fluids.  Hematochezia: Patient also reported hematochezia at home but her hemoglobin is currently stable.  Currently her stools are nonbloody.  Currently she does not have any active rectal bleeding.  Continue to monitor CBC.  Anxiety with depression: On Wellbutrin and Xanax at home    Nutrition Problem: Inadequate oral intake Etiology: acute illness      DVT prophylaxis:SCD Code Status: Full Family Communication: Husband at the bed side on 12/20  Disposition Plan: Home after full work-up   Consultants: None  Procedures:None  Antimicrobials:  Anti-infectives (From admission, onward)   Start     Dose/Rate Route Frequency Ordered Stop   08/11/19 1830  metroNIDAZOLE (FLAGYL) IVPB 500 mg     500 mg 100 mL/hr over 60 Minutes Intravenous Every 8 hours 08/11/19 1641     08/11/19 1800  ciprofloxacin (CIPRO) IVPB 400 mg     400 mg 200 mL/hr over 60 Minutes Intravenous Every 12 hours 08/11/19 1641     08/11/19 1700  metroNIDAZOLE (FLAGYL) IVPB 500 mg  Status:  Discontinued     500 mg 100 mL/hr over 60 Minutes Intravenous  Once 08/11/19 1648 08/11/19 1754   08/11/19 1700  ciprofloxacin (CIPRO) IVPB 400 mg  Status:  Discontinued     400 mg 200 mL/hr over 60 Minutes Intravenous  Once 08/11/19 1650 08/11/19 1753      Subjective:  Patient seen and examined the bedside this morning.  Hemodynamically stable.  Abdominal pain is better, she is tolerating diet.  No nausea or vomiting but diarrhea is persistent.  She had already 3 episodes of loose bowel movements this morning.  Objective: Vitals:   08/15/19 0525 08/15/19 1448 08/15/19 2050 08/16/19 0522  BP: (!) 134/59 131/67 133/77 134/68  Pulse: 86 81 88 78  Resp: _0 Temp: 98.5 F (36.9 C) 98.6 F (37 C) 98.3 F (36.8 C) 98.4 F (36.9 C)  TempSrc: Oral Oral  SpO2: 96% 96% 98% 96%  Weight:      Height:        Intake/Output Summary (Last 24 hours) at 08/16/2019 1316 Last data filed at 08/16/2019 1026 Gross per 24 hour  Intake 1408.65 ml  Output 0 ml  Net 1408.65 ml   Filed Weights   08/12/19 0019  Weight: 57 kg    Examination:  General exam: Not in distress,average built HEENT:PERRL,Oral mucosa moist, Ear/Nose normal on gross exam Respiratory system: Bilateral equal air entry, normal  vesicular breath sounds, no wheezes or crackles  Cardiovascular system: S1 & S2 heard, RRR. No JVD, murmurs, rubs, gallops or clicks. No pedal edema. Gastrointestinal system: Abdomen is nondistended, soft and nontender. No organomegaly or masses felt. Normal bowel sounds heard. Central nervous system: Alert and oriented. No focal neurological deficits. Extremities: No edema, no clubbing ,no cyanosis Skin: No rashes, lesions or ulcers,no icterus ,no pallor   Data Reviewed: I have personally reviewed following labs and imaging studies  CBC: Recent Labs  Lab 08/11/19 1230 08/12/19 0541 08/15/19 0533  WBC 6.9 9.2 8.8  NEUTROABS  --  6.9 6.2  HGB 13.0 11.6* 10.8*  HCT 39.7 35.0* 32.6*  MCV 90.6 88.6 89.1  PLT 220 203 646   Basic Metabolic Panel: Recent Labs  Lab 08/12/19 0541 08/13/19 0524 08/14/19 0540 08/15/19 0533 08/16/19 0557  NA 135 136 139 137 138  K 3.5 3.3* 3.6 3.7 3.6  CL 106 106 109 108 108  CO2 17* 21* _0 GLUCOSE 101* 108* 117* 111* 122*  BUN 6* <5* <5* <5* <5*  CREATININE 0.62 0.62 0.48 0.62 0.65  CALCIUM 7.8* 7.8* 7.8* 7.7* 7.6*   GFR: Estimated Creatinine Clearance: 65.6 mL/min (by C-G formula based on SCr of 0.65 mg/dL). Liver Function Tests: Recent Labs  Lab 08/11/19 1230 08/12/19 0541  AST 18 13*  ALT 19 15  ALKPHOS 69 60  BILITOT 1.0 1.3*  PROT 6.5 5.7*  ALBUMIN 3.5 3.1*   Recent Labs  Lab 08/11/19 1230 08/12/19 0541  LIPASE 104* 152*   No results for input(s): AMMONIA in the last 168 hours. Coagulation Profile: No results for input(s): INR, PROTIME in the last 168 hours. Cardiac Enzymes: No results for input(s): CKTOTAL, CKMB, CKMBINDEX, TROPONINI in the last 168 hours. BNP (last 3 results) No results for input(s): PROBNP in the last 8760 hours. HbA1C: No results for input(s): HGBA1C in the last 72 hours. CBG: No results for input(s): GLUCAP in the last 168 hours. Lipid Profile: No results for input(s): CHOL, HDL, LDLCALC,  TRIG, CHOLHDL, LDLDIRECT in the last 72 hours. Thyroid Function Tests: No results for input(s): TSH, T4TOTAL, FREET4, T3FREE, THYROIDAB in the last 72 hours. Anemia Panel: No results for input(s): VITAMINB12, FOLATE, FERRITIN, TIBC, IRON, RETICCTPCT in the last 72 hours. Sepsis Labs: No results for input(s): PROCALCITON, LATICACIDVEN in the last 168 hours.  Recent Results (from the past 240 hour(s))  SARS CORONAVIRUS 2 (TAT 6-24 HRS) Nasopharyngeal Nasopharyngeal Swab     Status: None   Collection Time: 08/11/19  2:32 PM   Specimen: Nasopharyngeal Swab  Result Value Ref Range Status   SARS Coronavirus 2 NEGATIVE NEGATIVE Final    Comment: (NOTE) SARS-CoV-2 target nucleic acids are NOT DETECTED. The SARS-CoV-2 RNA is generally detectable in upper and lower respiratory specimens during the acute phase of infection. Negative results do not preclude SARS-CoV-2 infection, do not rule out co-infections with other pathogens, and should not be used as the sole  basis for treatment or other patient management decisions. Negative results must be combined with clinical observations, patient history, and epidemiological information. The expected result is Negative. Fact Sheet for Patients: SugarRoll.be Fact Sheet for Healthcare Providers: https://www.woods-mathews.com/ This test is not yet approved or cleared by the [place] FDA and  has been authorized for detection and/or diagnosis of SARS-CoV-2 by FDA under an Emergency Use Authorization (EUA). This EUA will remain  in effect (meaning this test can be used) for the duration of the COVID-19 declaration under Section 56 4(b)(1) of the Act, 21 U.S.C. section 360bbb-3(b)(1), unless the authorization is terminated or revoked sooner. Performed at [facility], [place] [address], [place], [place] [address]          Radiology Studies: No results found.      Scheduled Meds: . buPROPion   150 mg Oral Daily  . feeding supplement  1 Container Oral TID BM  . multivitamin with minerals  1 tablet Oral Daily  . nicotine  14 mg Transdermal Daily   Continuous Infusions: . 0.9 % NaCl with KCl 20 mEq / L 100 mL/hr at 08/16/19 1009  . ciprofloxacin 400 mg (08/16/19 0527)  . metronidazole 500 mg (08/16/19 1010)     LOS: 4 days    Time spent: 25 mins.More than 50% of that time was spent in counseling and/or coordination of care.      Shelly Coss, MD [facility] Pager [phone]  If 7PM-7AM, please contact night-coverage [email or web address] Password [email or web address] 08/16/2019, 1:16 PM

## 2019-08-16 NOTE — Progress Notes (Signed)
Patient had 1 bloody stool overnight, medium occurrence of bright red bleeding. No c/o pain or nausea.

## 2019-08-17 DIAGNOSIS — R112 Nausea with vomiting, unspecified: Secondary | ICD-10-CM

## 2019-08-17 DIAGNOSIS — R1030 Lower abdominal pain, unspecified: Secondary | ICD-10-CM

## 2019-08-17 DIAGNOSIS — K529 Noninfective gastroenteritis and colitis, unspecified: Secondary | ICD-10-CM

## 2019-08-17 DIAGNOSIS — K625 Hemorrhage of anus and rectum: Secondary | ICD-10-CM

## 2019-08-17 LAB — BASIC METABOLIC PANEL
Anion gap: 6 (ref 5–15)
BUN: <5 mg/dL — ABNORMAL LOW (ref 8–23)
CO2: 24 mmol/L (ref 22–32)
Calcium: 7.9 mg/dL — ABNORMAL LOW (ref 8.9–10.3)
Chloride: 106 mmol/L (ref 98–111)
Creatinine, Ser: 0.55 mg/dL (ref 0.44–1.00)
GFR calc Af Amer: >60 mL/min (ref >60)
GFR calc non Af Amer: >60 mL/min (ref >60)
Glucose, Bld: 104 mg/dL — ABNORMAL HIGH (ref 70–99)
Potassium: 3.9 mmol/L (ref 3.5–5.1)
Sodium: 136 mmol/L (ref 135–145)

## 2019-08-17 LAB — CALPROTECTIN, FECAL: Calprotectin, Fecal: 7749 ug/g — ABNORMAL HIGH (ref 0–120)

## 2019-08-17 MED ORDER — PEG-KCL-NACL-NASULF-NA ASC-C 100 G PO SOLR
0.5000 | Freq: Once | ORAL | Status: AC
  Administered 2019-08-17: 100 g via ORAL
  Filled 2019-08-17: qty 1

## 2019-08-17 MED ORDER — PEG-KCL-NACL-NASULF-NA ASC-C 100 G PO SOLR: 1.0000 | Freq: Once | ORAL | Status: DC

## 2019-08-17 MED ORDER — PEG-KCL-NACL-NASULF-NA ASC-C 100 G PO SOLR
1.0000 | Freq: Once | ORAL | Status: AC
  Administered 2019-08-17: 200 g via ORAL
  Filled 2019-08-17: qty 1

## 2019-08-17 MED ORDER — PEG-KCL-NACL-NASULF-NA ASC-C 100 G PO SOLR
0.5000 | Freq: Once | ORAL | Status: AC
  Administered 2019-08-18: 100 g via ORAL
  Filled 2019-08-17: qty 1

## 2019-08-17 NOTE — H&P (View-Only) (Signed)
  Stool studies negative. Will try to rapid prep her for colonoscopy and EGD to be done today around 1:30pm. She understands prep must be completed by 11:30 am for 1:30 pm procedure. Other than prep keep NPO. She is hoping for discharge after procedures (hopefully this isn't IBD requiring IV steroids)  St  Stolll

## 2019-08-17 NOTE — Progress Notes (Signed)
  Stool studies negative. Will try to rapid prep her for colonoscopy and EGD to be done today around 1:30pm. She understands prep must be completed by 11:30 am for 1:30 pm procedure. Other than prep keep NPO. She is hoping for discharge after procedures (hopefully this isn't IBD requiring IV steroids)  St  Stolll

## 2019-08-17 NOTE — Progress Notes (Signed)
PROGRESS NOTE    [name]  EHU:[id number] DOB: 12-Jun-1956 DOA: 08/11/2019 PCP: Isaac Bliss, Rayford Halsted, MD   Brief Narrative:  Patient is a 63 year old female with history of anxiety, depression, headaches, history of pancreatitis who presented to the emergency department from home with complaints of abdominal pain, rectal bleeding, nausea, vomiting, diarrhea.  This has been going on for last 1 week.  On presentation she was hemodynamically stable.  Hemoglobin was stable.  She looked dehydrated on presentation and was started on IV fluids.  CT abdomen/pelvis showed mild wall thickening of the colon suggestive of diffuse inflammatory versus infectious colitis.  Started on IV antibiotics. GI pathogen panel came out to be negative so antibiotics discontinued.Plan for EGD/Colonocopy tomorrow.  Assessment & Plan:   Principal Problem:   Acute pancreatitis Active Problems:   Abdominal pain   Colitis   Hypokalemia   Anxiety with depression   Elevated lipase   Rectal bleeding   Acute colitis: CT abdomen and pelvis showed mild wall thickening of the colon.  Inflammatory vs  infectious colitis.  Started on antibiotics.  But since GI pathogen panel came out to be negative, antibiotics discontinued.CRP elevated, ESR mildly elevated No evidence of pancreatitis as per CT abdomen/pelvis.  Ultrasound of the gallbladder did not show any cholelithiasis or cholecystitis. GI following.  Plan for EGD/colonoscopy tomorrow.   Abdominal pain, nausea, vomiting, diarrhea: Much improved now.  Diarrhea has stopped.  Had a formed stool yesterday night.  No abdominal pain, nausea or vomiting.  Hematochezia: Patient also reported hematochezia at home but her hemoglobin is currently stable.  Currently her stools are nonbloody.  Currently she does not have any active rectal bleeding.  Continue to monitor CBC.  Anxiety with depression: On Wellbutrin and Xanax at home   Nutrition Problem: Inadequate oral  intake Etiology: acute illness      DVT prophylaxis:SCD Code Status: Full Family Communication: Husband at the bed side on 12/20  Disposition Plan: Home tomorrow after EGD/colonoscopy   Consultants: None  Procedures:None  Antimicrobials:  Anti-infectives (From admission, onward)   Start     Dose/Rate Route Frequency Ordered Stop   08/11/19 1830  metroNIDAZOLE (FLAGYL) IVPB 500 mg  Status:  Discontinued     500 mg 100 mL/hr over 60 Minutes Intravenous Every 8 hours 08/11/19 1641 08/17/19 0846   08/11/19 1800  ciprofloxacin (CIPRO) IVPB 400 mg  Status:  Discontinued     400 mg 200 mL/hr over 60 Minutes Intravenous Every 12 hours 08/11/19 1641 08/17/19 0846   08/11/19 1700  metroNIDAZOLE (FLAGYL) IVPB 500 mg  Status:  Discontinued     500 mg 100 mL/hr over 60 Minutes Intravenous  Once 08/11/19 1648 08/11/19 1754   08/11/19 1700  ciprofloxacin (CIPRO) IVPB 400 mg  Status:  Discontinued     400 mg 200 mL/hr over 60 Minutes Intravenous  Once 08/11/19 1650 08/11/19 1753      Subjective:  Patient seen and examined at the bedside this morning.  Hemodynamically stable.  She feels better today.  Diarrhea has finally stopped.  Had a formed stool last night.  No bowel movement this morning.  No nausea or vomiting or abdominal pain.  Objective: Vitals:   08/16/19 0522 08/16/19 1358 08/16/19 2134 08/17/19 0604  BP: 134/68 131/67 139/71 133/63  Pulse: 78 79 77 77  Resp: 16 17 17 17   Temp: 98.4 F (36.9 C) 98.3 F (36.8 C) 98.5 F (36.9 C) 98.5 F (36.9 C)  TempSrc:  Oral Oral  SpO2: 96% 97% 95% 95%  Weight:      Height:        Intake/Output Summary (Last 24 hours) at 08/17/2019 1323 Last data filed at 08/16/2019 1757 Gross per 24 hour  Intake 180 ml  Output 0 ml  Net 180 ml   Filed Weights   08/12/19 0019  Weight: 57 kg    Examination:  General exam: Not in distress,average built HEENT:PERRL,Oral mucosa moist, Ear/Nose normal on gross exam Respiratory system:  Bilateral equal air entry, normal vesicular breath sounds, no wheezes or crackles  Cardiovascular system: S1 & S2 heard, RRR. No JVD, murmurs, rubs, gallops or clicks. No pedal edema. Gastrointestinal system: Abdomen is nondistended, soft and nontender. No organomegaly or masses felt. Normal bowel sounds heard. Central nervous system: Alert and oriented. No focal neurological deficits. Extremities: No edema, no clubbing ,no cyanosis Skin: No rashes, lesions or ulcers,no icterus ,no pallor   Data Reviewed: I have personally reviewed following labs and imaging studies  CBC: Recent Labs  Lab 08/11/19 1230 08/12/19 0541 08/15/19 0533  WBC 6.9 9.2 8.8  NEUTROABS  --  6.9 6.2  HGB 13.0 11.6* 10.8*  HCT 39.7 35.0* 32.6*  MCV 90.6 88.6 89.1  PLT 220 203 026   Basic Metabolic Panel: Recent Labs  Lab 08/13/19 0524 08/14/19 0540 08/15/19 0533 08/16/19 0557 08/17/19 0556  NA 136 139 137 138 136  K 3.3* 3.6 3.7 3.6 3.9  CL 106 109 108 108 106  CO2 21* 22 22 23 24   GLUCOSE 108* 117* 111* 122* 104*  BUN <5* <5* <5* <5* <5*  CREATININE 0.62 0.48 0.62 0.65 0.55  CALCIUM 7.8* 7.8* 7.7* 7.6* 7.9*   GFR: Estimated Creatinine Clearance: 65.6 mL/min (by C-G formula based on SCr of 0.55 mg/dL). Liver Function Tests: Recent Labs  Lab 08/11/19 1230 08/12/19 0541  AST 18 13*  ALT 19 15  ALKPHOS 69 60  BILITOT 1.0 1.3*  PROT 6.5 5.7*  ALBUMIN 3.5 3.1*   Recent Labs  Lab 08/11/19 1230 08/12/19 0541  LIPASE 104* 152*   No results for input(s): AMMONIA in the last 168 hours. Coagulation Profile: No results for input(s): INR, PROTIME in the last 168 hours. Cardiac Enzymes: No results for input(s): CKTOTAL, CKMB, CKMBINDEX, TROPONINI in the last 168 hours. BNP (last 3 results) No results for input(s): PROBNP in the last 8760 hours. HbA1C: No results for input(s): HGBA1C in the last 72 hours. CBG: No results for input(s): GLUCAP in the last 168 hours. Lipid Profile: No results  for input(s): CHOL, HDL, LDLCALC, TRIG, CHOLHDL, LDLDIRECT in the last 72 hours. Thyroid Function Tests: No results for input(s): TSH, T4TOTAL, FREET4, T3FREE, THYROIDAB in the last 72 hours. Anemia Panel: No results for input(s): VITAMINB12, FOLATE, FERRITIN, TIBC, IRON, RETICCTPCT in the last 72 hours. Sepsis Labs: No results for input(s): PROCALCITON, LATICACIDVEN in the last 168 hours.  Recent Results (from the past 240 hour(s))  SARS CORONAVIRUS 2 (TAT 6-24 HRS) Nasopharyngeal Nasopharyngeal Swab     Status: None   Collection Time: 08/11/19  2:32 PM   Specimen: Nasopharyngeal Swab  Result Value Ref Range Status   SARS Coronavirus 2 NEGATIVE NEGATIVE Final    Comment: (NOTE) SARS-CoV-2 target nucleic acids are NOT DETECTED. The SARS-CoV-2 RNA is generally detectable in upper and lower respiratory specimens during the acute phase of infection. Negative results do not preclude SARS-CoV-2 infection, do not rule out co-infections with other pathogens, and should not be used  as the sole basis for treatment or other patient management decisions. Negative results must be combined with clinical observations, patient history, and epidemiological information. The expected result is Negative. Fact Sheet for Patients: SugarRoll.be Fact Sheet for Healthcare Providers: https://www.woods-mathews.com/ This test is not yet approved or cleared by the [place] FDA and  has been authorized for detection and/or diagnosis of SARS-CoV-2 by FDA under an Emergency Use Authorization (EUA). This EUA will remain  in effect (meaning this test can be used) for the duration of the COVID-19 declaration under Section 56 4(b)(1) of the Act, 21 U.S.C. section 360bbb-3(b)(1), unless the authorization is terminated or revoked sooner. Performed at [facility], [place] [address] [place], [place] [address]   GI pathogen panel by PCR, stool     Status: None    Collection Time: 08/12/19  1:51 PM   Specimen: Stool  Result Value Ref Range Status   Plesiomonas shigelloides NOT DETECTED NOT DETECTED Final   Yersinia enterocolitica NOT DETECTED NOT DETECTED Final   Vibrio NOT DETECTED NOT DETECTED Final   Enteropathogenic E coli NOT DETECTED NOT DETECTED Final   E coli (ETEC) LT/ST NOT DETECTED NOT DETECTED Final   E coli 7289 by PCR Not applicable NOT DETECTED Final   Cryptosporidium by PCR NOT DETECTED NOT DETECTED Final   Entamoeba histolytica NOT DETECTED NOT DETECTED Final   Adenovirus F 40/41 NOT DETECTED NOT DETECTED Final   Norovirus GI/GII NOT DETECTED NOT DETECTED Final   Sapovirus NOT DETECTED NOT DETECTED Final    Comment: (NOTE) Performed At: [facility] [place], [place] [id number] Rush Farmer MD CB:[id number]    Vibrio cholerae NOT DETECTED NOT DETECTED Final   Campylobacter by PCR NOT DETECTED NOT DETECTED Final   Salmonella by PCR NOT DETECTED NOT DETECTED Final   E coli (STEC) NOT DETECTED NOT DETECTED Final   Enteroaggregative E coli NOT DETECTED NOT DETECTED Final   Shigella by PCR NOT DETECTED NOT DETECTED Final   Cyclospora cayetanensis NOT DETECTED NOT DETECTED Final   Astrovirus NOT DETECTED NOT DETECTED Final   G lamblia by PCR NOT DETECTED NOT DETECTED Final   Rotavirus A by PCR NOT DETECTED NOT DETECTED Final         Radiology Studies: No results found.      Scheduled Meds: . buPROPion  150 mg Oral Daily  . feeding supplement  1 Container Oral TID BM  . multivitamin with minerals  1 tablet Oral Daily  . nicotine  14 mg Transdermal Daily   Continuous Infusions: . 0.9 % NaCl with KCl 20 mEq / L 75 mL/hr at 08/17/19 1046     LOS: 5 days    Time spent: 25 mins.More than 50% of that time was spent in counseling and/or coordination of care.      Shelly Coss, MD [facility] Pager [phone]  If 7PM-7AM, please contact night-coverage [email or web address] Password  [email or web address] 08/17/2019, 1:23 PM

## 2019-08-17 NOTE — Progress Notes (Signed)
Nutrition Follow-up  INTERVENTION:   Resume after procedure: -Boost Breeze po TID, each supplement provides 250 kcal and 9 grams of protein -Recommend Prostat liquid protein PO 30 ml TID with meals, each supplement provides 100 kcal, 15 grams protein. -Multivitamin with minerals daily  NUTRITION DIAGNOSIS:   Inadequate oral intake related to acute illness as evidenced by other (comment)(pt consuming 25-50% of  CL diet order).  Ongoing.  GOAL:   Patient will meet greater than or equal to 90% of their needs  Not meeting currently, having colonoscopy/EGD today  MONITOR:   Weight trends, Supplement acceptance, Labs, I & O's, Diet advancement, PO intake  ASSESSMENT:   63 year old female with history of anxiety, depression, headaches, h/o pancreatitis who presented to ED with complaints of abdominal pain, rectal bleeding, n/v/d over the past week. CT abdomen/pelvis showed mild wall thickening of colon suggestive of diffuse inflammatory verses infectious colitis; pt started on antibiotics and GI pathogen panel sent.  Patient currently NPO today for scheduled colonoscopy/EGD today. Pt's stool studies were negative and pt needs biopsies to r/o IBD per GI note. Yesterday pt was on clear liquids, drank ~1 [employer]. Prior to diet change, pt was consuming 25-50% of meals. Pt continued to have diarrhea this admission. Will monitor for plan going forward.  Admission weight: 125 lbs. No new weights for admission.  I/Os: +9L since admit  Labs reviewed. Medications: IV Phenergan PRN  Diet Order:   Diet Order            Diet clear liquid Room service appropriate? Yes; Fluid consistency: Thin  Diet effective 0500 tomorrow              EDUCATION NEEDS:   No education needs have been identified at this time  Skin:  Skin Assessment: Reviewed RN Assessment  Last BM:  12/22 -type 6  Height:   Ht Readings from Last 1 Encounters:  08/12/19 5' 5"  (1.651 m)    Weight:   Wt  Readings from Last 1 Encounters:  08/12/19 57 kg    Ideal Body Weight:  56.8 kg  BMI:  Body mass index is 20.91 kg/m.  Estimated Nutritional Needs:   Kcal:  1500-1700  Protein:  75-85  Fluid:  >/= 1.5 L/day  Clayton Bibles, MS, RD, LDN Inpatient Clinical Dietitian Pager: [phone] After Hours Pager: [phone]

## 2019-08-18 ENCOUNTER — Encounter (HOSPITAL_COMMUNITY)
Admission: EM | Disposition: A | Discharge status: Home / Self Care | Payer: Self-pay | Source: Home / Self Care | Attending: Internal Medicine

## 2019-08-18 ENCOUNTER — Ambulatory Visit: Payer: Managed Care, Other (non HMO) | Admitting: Physician Assistant

## 2019-08-18 ENCOUNTER — Inpatient Hospital Stay (HOSPITAL_COMMUNITY): Payer: Managed Care, Other (non HMO) | Admitting: Anesthesiology

## 2019-08-18 ENCOUNTER — Encounter (HOSPITAL_COMMUNITY): Payer: Self-pay | Admitting: Internal Medicine

## 2019-08-18 DIAGNOSIS — K635 Polyp of colon: Secondary | ICD-10-CM

## 2019-08-18 DIAGNOSIS — K3189 Other diseases of stomach and duodenum: Secondary | ICD-10-CM

## 2019-08-18 DIAGNOSIS — K621 Rectal polyp: Secondary | ICD-10-CM

## 2019-08-18 HISTORY — PX: POLYPECTOMY: SHX5525

## 2019-08-18 HISTORY — PX: COLONOSCOPY: SHX5424

## 2019-08-18 HISTORY — PX: ESOPHAGOGASTRODUODENOSCOPY: SHX5428

## 2019-08-18 HISTORY — PX: BIOPSY: SHX5522

## 2019-08-18 LAB — GI PATHOGEN PANEL BY PCR, STOOL

## 2019-08-18 SURGERY — EGD (ESOPHAGOGASTRODUODENOSCOPY)
Anesthesia: Monitor Anesthesia Care

## 2019-08-18 MED ORDER — PROPOFOL 500 MG/50ML IV EMUL
INTRAVENOUS | Status: DC | PRN
  Administered 2019-08-18: 125 ug/kg/min via INTRAVENOUS

## 2019-08-18 MED ORDER — LIDOCAINE 2% (20 MG/ML) 5 ML SYRINGE
INTRAMUSCULAR | Status: DC | PRN
  Administered 2019-08-18: 60 mg via INTRAVENOUS
  Administered 2019-08-18: 40 mg via INTRAVENOUS

## 2019-08-18 MED ORDER — FENTANYL CITRATE (PF) 100 MCG/2ML IJ SOLN
INTRAMUSCULAR | Status: AC
  Filled 2019-08-18: qty 2

## 2019-08-18 MED ORDER — PROPOFOL 500 MG/50ML IV EMUL
INTRAVENOUS | Status: AC
  Filled 2019-08-18: qty 50

## 2019-08-18 MED ORDER — PROPOFOL 10 MG/ML IV BOLUS
INTRAVENOUS | Status: AC
  Filled 2019-08-18: qty 20

## 2019-08-18 MED ORDER — LACTATED RINGERS IV SOLN: INTRAVENOUS | Status: DC | PRN

## 2019-08-18 MED ORDER — MESALAMINE 1.2 G PO TBEC: 2.4000 g | DELAYED_RELEASE_TABLET | Freq: Every day | ORAL | 1 refills | Status: DC

## 2019-08-18 MED ORDER — PREDNISONE 10 MG PO TABS: ORAL_TABLET | ORAL | 0 refills | Status: DC

## 2019-08-18 MED ORDER — PROPOFOL 10 MG/ML IV BOLUS
INTRAVENOUS | Status: DC | PRN
  Administered 2019-08-18: 30 mg via INTRAVENOUS

## 2019-08-18 SURGICAL SUPPLY — 22 items

## 2019-08-18 NOTE — Progress Notes (Signed)
Patient finished MoviPrep at [phone]. Norlene Duel RN, BSN

## 2019-08-18 NOTE — Anesthesia Preprocedure Evaluation (Addendum)
Anesthesia Evaluation  Patient identified by MRN, date of birth, ID band Patient awake    Reviewed: Allergy & Precautions, NPO status , Patient's Chart, lab work & pertinent test results  Airway Mallampati: III  TM Distance: >3 FB Neck ROM: Full    Dental no notable dental hx.    Pulmonary Current Smoker and Patient abstained from smoking.,    Pulmonary exam normal breath sounds clear to auscultation       Cardiovascular negative cardio ROS Normal cardiovascular exam Rhythm:Regular Rate:Normal  ECG: NSR, rate 70   Neuro/Psych  Headaches, PSYCHIATRIC DISORDERS Anxiety Depression    GI/Hepatic negative GI ROS, Neg liver ROS,   Endo/Other  negative endocrine ROS  Renal/GU negative Renal ROS     Musculoskeletal negative musculoskeletal ROS (+)   Abdominal   Peds  Hematology  (+) anemia ,   Anesthesia Other Findings colitis, anemia  Reproductive/Obstetrics                            Anesthesia Physical Anesthesia Plan  ASA: II  Anesthesia Plan: MAC   Post-op Pain Management:    Induction: Intravenous  PONV Risk Score and Plan: 1 and Propofol infusion and Treatment may vary due to age or medical condition  Airway Management Planned: Nasal Cannula  Additional Equipment:   Intra-op Plan:   Post-operative Plan:   Informed Consent: I have reviewed the patients History and Physical, chart, labs and discussed the procedure including the risks, benefits and alternatives for the proposed anesthesia with the patient or authorized representative who has indicated his/her understanding and acceptance.     Dental advisory given  Plan Discussed with: CRNA  Anesthesia Plan Comments:        Anesthesia Quick Evaluation

## 2019-08-18 NOTE — Op Note (Addendum)
[facility] Patient Name: [name] Procedure Date: 08/18/2019 MRN: [id number] Attending MD: Jackquline Denmark , MD Date of Birth: 28-Sep-1955 CSN: [id number] Age: 63 Admit Type: Inpatient Procedure:                Colonoscopy Indications:              Rectal bleeding with abn CT showing colitis. Neg                            stool studies. H/O colitis January 2009, treated                            briefly with mesalamine. History of tubular                            adenomas 2009. Neg colonoscopy 2012. Providers:                Jackquline Denmark, MD, Ashley Jacobs, RN, Marguerita Merles, Technician Referring MD:              Medicines:                Monitored Anesthesia Care Complications:            No immediate complications. Estimated Blood Loss:     Estimated blood loss: none. Procedure:                Pre-Anesthesia Assessment:                           - Prior to the procedure, a History and Physical                            was performed, and patient medications and                            allergies were reviewed. The patient's tolerance of                            previous anesthesia was also reviewed. The risks                            and benefits of the procedure and the sedation                            options and risks were discussed with the patient.                            All questions were answered, and informed consent                            was obtained. Prior Anticoagulants: The patient has                            taken  no previous anticoagulant or antiplatelet                            agents. ASA Grade Assessment: II - A patient with                            mild systemic disease. After reviewing the risks                            and benefits, the patient was deemed in                            satisfactory condition to undergo the procedure.                           After obtaining informed  consent, the colonoscope                            was passed under direct vision. Throughout the                            procedure, the patient's blood pressure, pulse, and                            oxygen saturations were monitored continuously. The                            PCF-H190DL ([id number] Olympus pediatric colonscope                            was introduced through the anus and advanced to the                            2 cm into the ileum. The colonoscopy was performed                            without difficulty. The patient tolerated the                            procedure well. The quality of the bowel                            preparation was adequate to identify polyps. The                            terminal ileum, ileocecal valve, appendiceal                            orifice, and rectum were photographed. Scope In: 9:20:25 AM Scope Out: 9:46:43 AM Scope Withdrawal Time: 0 hours 18 minutes 57 seconds  Total Procedure Duration: 0 hours 26 minutes 18 seconds  Findings:      Scattered moderate inflammation characterized by congestion (edema),       erosions, erythema, friability and granularity was found in  the entire       colon. Biopsies were taken with a cold forceps for histology.      Two sessile polyps were found in the rectum and mid sigmoid colon. The       polyps were 4 to 6 mm in size. These polyps were removed with a cold       snare. Resection and retrieval were complete.      Non-bleeding internal hemorrhoids were found during retroflexion. The       hemorrhoids were small.      The terminal ileum appeared normal. Biopsies were taken with a cold       forceps for histology. Impression:               -Pancolitis (highly s/o ulcerative colitis).                            Biopsied.                           -Normal TI (biopsied)                           -Small colonic polyps s/p polypectomy. Moderate Sedation:      Not Applicable - Patient had care  per Anesthesia. Recommendation:           - Return to ward for ongoing care.                           - Resume previous diet.                           - Start prednisone 40 mg p.o. QD x 2 weeks (or                            until diarrhea and rectal bleeding resolves                            completely), then taper down to 30 mg p.o. qd x 1                            week, then 20 mg p.o. QD for 2 weeks followed by 10                            mg p.o. QD for 2 weeks.                           - Continue present medications.                           - Await pathology results.                           - Repeat colonoscopy for surveillance based on                            pathology results.                           -  Return to GI clinic in 2-4 weeks.                           - Check TPMT, hepatitis B surface antigen, TB Gold                            in anticipation of biologic therapy today. Restart                            mesalamine (lialda) 2.4g po qd.                           - If she tolerates p.o., can discharge home later                            today. Procedure Code(s):        --- Professional ---                           [phone], Colonoscopy, flexible; with removal of                            tumor(s), polyp(s), or other lesion(s) by snare                            technique                           45380, 50, Colonoscopy, flexible; with biopsy,                            single or multiple Diagnosis Code(s):        --- Professional ---                           K52.9, Noninfective gastroenteritis and colitis,                            unspecified                           K62.1, Rectal polyp                           K63.5, Polyp of colon                           K64.8, Other hemorrhoids                           K62.5, Hemorrhage of anus and rectum CPT copyright 2019 [employer]. All rights reserved. The codes documented in this  report are preliminary and upon coder review may  be revised to meet current compliance requirements. Jackquline Denmark, MD 08/18/2019 10:18:22 AM This report has been signed electronically. Number of Addenda: 0

## 2019-08-18 NOTE — Discharge Summary (Signed)
Physician Discharge Summary  [name] YNW:[id number] DOB: 1956/08/13 DOA: 08/11/2019  PCP: Isaac Bliss, Rayford Halsted, MD  Admit date: 08/11/2019 Discharge date: 08/18/2019  Admitted From: Home Disposition:  Home  Discharge Condition:Stable CODE STATUS:FULL Diet recommendation: Regular   Brief/Interim Summary:  Patient is a 63 year old female with history of anxiety, depression, headaches, history of pancreatitis who presented to the emergency department from home with complaints of abdominal pain, rectal bleeding, nausea, vomiting, diarrhea.  This has been going on for last 1 week.  On presentation she was hemodynamically stable.  Hemoglobin was stable.  She looked dehydrated on presentation and was started on IV fluids.  CT abdomen/pelvis showed mild wall thickening of the colon suggestive of diffuse inflammatory versus infectious colitis.  Started on IV antibiotics. GI pathogen panel came out to be negative so antibiotics discontinued.Nausea, vomiting, abdominal pain, diarrhea have significantly improved.She  underwent colonoscopy today which showed pancolitis.  She has been started on steroids, Lialda.  She will follow-up with GI as an outpatient.  Following problems were addressed during her hospitalization:  Acute colitis: CT abdomen and pelvis showed mild wall thickening of the colon.  Inflammatory vs  infectious colitis.  Started on antibiotics.  But since GI pathogen panel came out to be negative, antibiotics discontinued.CRP elevated, ESR mildly elevated No evidence of pancreatitis as per CT abdomen/pelvis.  Ultrasound of the gallbladder did not show any cholelithiasis or cholecystitis. GI following.   She  underwent colonoscopy today which showed pancolitis.  She has been started on steroids, Lialda.  She will follow-up with GI as an outpatient.  Abdominal pain, nausea, vomiting, diarrhea: Much improved now.  Diarrhea has stopped.    No abdominal pain, nausea or  vomiting.  Hematochezia: Patient also reported hematochezia at home but her hemoglobin is currently stable.  Currently her stools are nonbloody.  Currently she does not have any active rectal bleeding.  Continue to monitor CBC.  Anxiety with depression: On Wellbutrin and Xanax at home   Discharge Diagnoses:  Principal Problem:   Acute pancreatitis Active Problems:   Abdominal pain   Colitis   Hypokalemia   Anxiety with depression   Elevated lipase   Rectal bleeding    Discharge Instructions  Discharge Instructions    Diet general   Complete by: As directed    Discharge instructions   Complete by: As directed    1)Please follow up with your PCP in a week.  Do a CBC, BMP test during the follow-up 2)Take prescribed medications as instructed. 3)You will be called by gastroenterology for the follow-up appointment   Increase activity slowly   Complete by: As directed      Allergies as of 08/18/2019   No Known Allergies     Medication List    STOP taking these medications   ciprofloxacin 500 MG tablet Commonly known as: CIPRO   metroNIDAZOLE 500 MG tablet Commonly known as: FLAGYL     TAKE these medications   ALPRAZolam 0.25 MG tablet Commonly known as: XANAX Take 1 tablet (0.25 mg total) by mouth at bedtime as needed for anxiety. TAKE 1 TABLET BY MOUTH TWICE A DAY AS NEEDED   buPROPion 150 MG 24 hr tablet Commonly known as: WELLBUTRIN XL TAKE 1 TABLET BY MOUTH EVERY DAY   mesalamine 1.2 g EC tablet Commonly known as: Lialda Take 2 tablets (2.4 g total) by mouth daily with breakfast.   multivitamin tablet Take 1 tablet by mouth daily.   predniSONE 10 MG tablet  Commonly known as: DELTASONE Take 4 pills daily for 7 days then 3 pills daily for 7 days then continue taking 2 pills a day until follow-up with gastroenterology      Follow-up Information    Isaac Bliss, Rayford Halsted, MD. Schedule an appointment as soon as possible for a visit in 1 week(s).    Specialty: Internal Medicine Contact information: [place] [address] [phone]          No Known Allergies  Consultations: GI  Procedures/Studies: CT Abdomen Pelvis W Contrast  Result Date: 08/11/2019 CLINICAL DATA:  3rd emergency visit in past 5 days with a chief complaint of generalized abdominal pain and rectal bleeding. She was seen at [facility] ED on Saturday and Monday. CT scan revealed "colitis, pancreatitis, UTI". Now her rectal bleeding has increased in frequency and amount. Appointment tomorrow with [facility]. EXAM: CT ABDOMEN AND PELVIS WITH CONTRAST TECHNIQUE: Multidetector CT imaging of the abdomen and pelvis was performed using the standard protocol following bolus administration of intravenous contrast. CONTRAST:  149m OMNIPAQUE IOHEXOL 300 MG/ML  SOLN COMPARISON:  None. FINDINGS: Lower chest: Linear dependent lung base atelectasis. No acute findings. Hepatobiliary: Liver normal in size and attenuation. No mass or focal lesion. Mild intrahepatic bile duct prominence. Common bile duct normal caliber. Is apparent increased attenuation along the gallbladder neck/cystic duct,a which could reflect small stones. Gallbladder otherwise unremarkable with no wall thickening or evidence acute cholecystitis. Pancreas: Unremarkable. No pancreatic ductal dilatation or surrounding inflammatory changes. Spleen: Normal in size without focal abnormality. Adrenals/Urinary Tract: No adrenal masses. Kidneys are normal in position. Left kidney is larger than the right. There are left renal sinus cysts. Subcentimeter low-density lesions are seen in both kidneys too small to fully characterize, but likely also cysts. There is symmetric renal enhancement and excretion. No hydronephrosis. Normal ureters. Bladder mostly decompressed but otherwise unremarkable. Stomach/Bowel: Mild wall thickening is suggested throughout the colon, with this assessment somewhat limited by  lack of colonic distention. Multiple sigmoid colon diverticula, without evidence of diverticulitis. Stomach and small bowel are unremarkable. Vascular/Lymphatic: Prominent to mildly enlarged gastrohepatic ligament lymph nodes, largest measuring 1.3 cm short axis. No other prominent or enlarged lymph nodes. Aortic atherosclerosis. No aneurysm. Reproductive: Uterus and bilateral adnexa are unremarkable. Other: No abdominal wall hernia.  No ascites. Musculoskeletal: No fracture or acute finding. No osteoblastic or osteolytic lesions. IMPRESSION: 1. Mild wall thickening suggested throughout the colon. Suspect mild diffuse inflammatory or infectious colitis. 2. There are sigmoid colon diverticula but no evidence of diverticulitis. 3. No other acute finding within the abdomen pelvis. Prominent to mildly enlarged gastrohepatic ligament lymph nodes, most likely chronic and reactive. 4. Possible small stones in the gallbladder neck and cystic duct, but no evidence of acute cholecystitis. 5. Left renal sinus cysts. 6. Aortic atherosclerosis. Electronically Signed   By: DLajean ManesM.D.   On: 08/11/2019 15:13   UKoreaAbdomen Limited RUQ  Result Date: 08/11/2019 CLINICAL DATA:  Abdominal pain for 5 days EXAM: ULTRASOUND ABDOMEN LIMITED RIGHT UPPER QUADRANT COMPARISON:  Same-day CT abdomen pelvis. FINDINGS: Gallbladder: No gallstones or wall thickening visualized. No sonographic Murphy sign noted by sonographer. Common bile duct: Diameter: Distal common bile duct is poorly visualized. Proximal bile duct measures 3.4 mm, nondilated. Liver: No focal lesion identified. Within normal limits in parenchymal echogenicity. Portal vein is patent on color Doppler imaging with normal direction of blood flow towards the liver. Other: None. IMPRESSION: Nonvisualization of the distal common bile duct. No  visible cholelithiasis, choledocholithiasis, biliary dilatation or features of acute cholecystitis. Electronically Signed   By: Lovena Le M.D.   On: 08/11/2019 19:29       Subjective: Patient is  hemodynamically stable for discharge today.  Discharge Exam: Vitals:   08/18/19 1000 08/18/19 1010  BP: 140/62 137/63  Pulse: 77 74  Resp: (!) 22 19  Temp:    SpO2: 100% 100%   Vitals:   08/18/19 0825 08/18/19 0954 08/18/19 1000 08/18/19 1010  BP: (!) 167/68 (!) 131/54 140/62 137/63  Pulse: 79 81 77 74  Resp: 19 18 (!) 22 19  Temp:  97.7 F (36.5 C)    TempSrc:  Temporal    SpO2: (!) 20% 100% 100% 100%  Weight:      Height:        General: Pt is alert, awake, not in acute distress Cardiovascular: RRR, S1/S2 +, no rubs, no gallops Respiratory: CTA bilaterally, no wheezing, no rhonchi Abdominal: Soft, NT, ND, bowel sounds + Extremities: no edema, no cyanosis    The results of significant diagnostics from this hospitalization (including imaging, microbiology, ancillary and laboratory) are listed below for reference.     Microbiology: Recent Results (from the past 240 hour(s))  SARS CORONAVIRUS 2 (TAT 6-24 HRS) Nasopharyngeal Nasopharyngeal Swab     Status: None   Collection Time: 08/11/19  2:32 PM   Specimen: Nasopharyngeal Swab  Result Value Ref Range Status   SARS Coronavirus 2 NEGATIVE NEGATIVE Final    Comment: (NOTE) SARS-CoV-2 target nucleic acids are NOT DETECTED. The SARS-CoV-2 RNA is generally detectable in upper and lower respiratory specimens during the acute phase of infection. Negative results do not preclude SARS-CoV-2 infection, do not rule out co-infections with other pathogens, and should not be used as the sole basis for treatment or other patient management decisions. Negative results must be combined with clinical observations, patient history, and epidemiological information. The expected result is Negative. Fact Sheet for Patients: SugarRoll.be Fact Sheet for Healthcare Providers: https://www.woods-mathews.com/ This test is not yet  approved or cleared by the [place] FDA and  has been authorized for detection and/or diagnosis of SARS-CoV-2 by FDA under an Emergency Use Authorization (EUA). This EUA will remain  in effect (meaning this test can be used) for the duration of the COVID-19 declaration under Section 56 4(b)(1) of the Act, 21 U.S.C. section 360bbb-3(b)(1), unless the authorization is terminated or revoked sooner. Performed at [facility], [place] [address], [place], [place] [address]   GI pathogen panel by PCR, stool     Status: None   Collection Time: 08/12/19  1:51 PM   Specimen: Stool  Result Value Ref Range Status   Plesiomonas shigelloides NOT DETECTED NOT DETECTED Final   Yersinia enterocolitica NOT DETECTED NOT DETECTED Final   Vibrio NOT DETECTED NOT DETECTED Final   Enteropathogenic E coli NOT DETECTED NOT DETECTED Final   E coli (ETEC) LT/ST NOT DETECTED NOT DETECTED Final   E coli 1025 by PCR Not applicable NOT DETECTED Final   Cryptosporidium by PCR NOT DETECTED NOT DETECTED Final   Entamoeba histolytica NOT DETECTED NOT DETECTED Final   Adenovirus F 40/41 NOT DETECTED NOT DETECTED Final   Norovirus GI/GII NOT DETECTED NOT DETECTED Final   Sapovirus NOT DETECTED NOT DETECTED Final    Comment: (NOTE) Performed At: [facility] [address] [place], [place] [id number] Rush Farmer MD PN:[id number]    Vibrio cholerae NOT DETECTED NOT DETECTED Final   Campylobacter  by PCR NOT DETECTED NOT DETECTED Final   Salmonella by PCR NOT DETECTED NOT DETECTED Final   E coli (STEC) NOT DETECTED NOT DETECTED Final   Enteroaggregative E coli NOT DETECTED NOT DETECTED Final   Shigella by PCR NOT DETECTED NOT DETECTED Final   Cyclospora cayetanensis NOT DETECTED NOT DETECTED Final   Astrovirus NOT DETECTED NOT DETECTED Final   G lamblia by PCR NOT DETECTED NOT DETECTED Final   Rotavirus A by PCR NOT DETECTED NOT DETECTED Final  Calprotectin, Fecal     Status: Abnormal   Collection  Time: 08/14/19 11:12 AM   Specimen: Stool  Result Value Ref Range Status   Calprotectin, Fecal 7,749 (H) 0 - 120 ug/g Final    Comment: (NOTE) **Results verified by repeat testing** Concentration     Interpretation   Follow-Up <16 - 50 ug/g     Normal           None >50 -120 ug/g     Borderline       Re-evaluate in 4-6 weeks    >120 ug/g     Abnormal         Repeat as clinically                                   indicated Performed At: [facility] [place], [place] [id number] Rush Farmer MD BF:[id number]   GI pathogen panel by PCR, stool     Status: None   Collection Time: 08/14/19 12:31 PM   Specimen: Stool  Result Value Ref Range Status   Plesiomonas shigelloides NOT DETECTED NOT DETECTED Final   Yersinia enterocolitica NOT DETECTED NOT DETECTED Final   Vibrio NOT DETECTED NOT DETECTED Final   Enteropathogenic E coli NOT DETECTED NOT DETECTED Final   E coli (ETEC) LT/ST NOT DETECTED NOT DETECTED Final   E coli 0600 by PCR Not applicable NOT DETECTED Final   Cryptosporidium by PCR NOT DETECTED NOT DETECTED Final   Entamoeba histolytica NOT DETECTED NOT DETECTED Final   Adenovirus F 40/41 NOT DETECTED NOT DETECTED Final   Norovirus GI/GII NOT DETECTED NOT DETECTED Final   Sapovirus NOT DETECTED NOT DETECTED Final    Comment: (NOTE) Performed At: [facility] [place], [place] [id number] Rush Farmer MD EL:[id number]    Vibrio cholerae NOT DETECTED NOT DETECTED Final   Campylobacter by PCR NOT DETECTED NOT DETECTED Final   Salmonella by PCR NOT DETECTED NOT DETECTED Final   E coli (STEC) NOT DETECTED NOT DETECTED Final   Enteroaggregative E coli NOT DETECTED NOT DETECTED Final   Shigella by PCR NOT DETECTED NOT DETECTED Final   Cyclospora cayetanensis NOT DETECTED NOT DETECTED Final   Astrovirus NOT DETECTED NOT DETECTED Final   G lamblia by PCR NOT DETECTED NOT DETECTED Final   Rotavirus A by PCR NOT DETECTED NOT DETECTED  Final     Labs: BNP (last 3 results) No results for input(s): BNP in the last 8760 hours. Basic Metabolic Panel: Recent Labs  Lab 08/13/19 0524 08/14/19 0540 08/15/19 0533 08/16/19 0557 08/17/19 0556  NA 136 139 137 138 136  K 3.3* 3.6 3.7 3.6 3.9  CL 106 109 108 108 106  CO2 21* _0 GLUCOSE 108* 117* 111* 122* 104*  BUN <5* <5* <5* <5* <5*  CREATININE 0.62 0.48 0.62 0.65 0.55  CALCIUM 7.8* 7.8* 7.7* 7.6* 7.9*  Liver Function Tests: Recent Labs  Lab 08/11/19 1230 08/12/19 0541  AST 18 13*  ALT 19 15  ALKPHOS 69 60  BILITOT 1.0 1.3*  PROT 6.5 5.7*  ALBUMIN 3.5 3.1*   Recent Labs  Lab 08/11/19 1230 08/12/19 0541  LIPASE 104* 152*   No results for input(s): AMMONIA in the last 168 hours. CBC: Recent Labs  Lab 08/11/19 1230 08/12/19 0541 08/15/19 0533  WBC 6.9 9.2 8.8  NEUTROABS  --  6.9 6.2  HGB 13.0 11.6* 10.8*  HCT 39.7 35.0* 32.6*  MCV 90.6 88.6 89.1  PLT 220 203 223   Cardiac Enzymes: No results for input(s): CKTOTAL, CKMB, CKMBINDEX, TROPONINI in the last 168 hours. BNP: Invalid input(s): POCBNP CBG: No results for input(s): GLUCAP in the last 168 hours. D-Dimer No results for input(s): DDIMER in the last 72 hours. Hgb A1c No results for input(s): HGBA1C in the last 72 hours. Lipid Profile No results for input(s): CHOL, HDL, LDLCALC, TRIG, CHOLHDL, LDLDIRECT in the last 72 hours. Thyroid function studies No results for input(s): TSH, T4TOTAL, T3FREE, THYROIDAB in the last 72 hours.  Invalid input(s): FREET3 Anemia work up No results for input(s): VITAMINB12, FOLATE, FERRITIN, TIBC, IRON, RETICCTPCT in the last 72 hours. Urinalysis    Component Value Date/Time   COLORURINE YELLOW 08/11/2019 [place] 08/11/2019 1214   LABSPEC 1.017 08/11/2019 1214   PHURINE 5.0 08/11/2019 1214   GLUCOSEU NEGATIVE 08/11/2019 1214   HGBUR SMALL (A) 08/11/2019 1214   BILIRUBINUR NEGATIVE 08/11/2019 1214   BILIRUBINUR small (A)  01/18/2018 1703   BILIRUBINUR N 04/13/2017 1421   KETONESUR 80 (A) 08/11/2019 1214   PROTEINUR NEGATIVE 08/11/2019 1214   UROBILINOGEN 0.2 01/18/2018 1703   NITRITE NEGATIVE 08/11/2019 1214   LEUKOCYTESUR TRACE (A) 08/11/2019 1214   Sepsis Labs Invalid input(s): PROCALCITONIN,  WBC,  LACTICIDVEN Microbiology Recent Results (from the past 240 hour(s))  SARS CORONAVIRUS 2 (TAT 6-24 HRS) Nasopharyngeal Nasopharyngeal Swab     Status: None   Collection Time: 08/11/19  2:32 PM   Specimen: Nasopharyngeal Swab  Result Value Ref Range Status   SARS Coronavirus 2 NEGATIVE NEGATIVE Final    Comment: (NOTE) SARS-CoV-2 target nucleic acids are NOT DETECTED. The SARS-CoV-2 RNA is generally detectable in upper and lower respiratory specimens during the acute phase of infection. Negative results do not preclude SARS-CoV-2 infection, do not rule out co-infections with other pathogens, and should not be used as the sole basis for treatment or other patient management decisions. Negative results must be combined with clinical observations, patient history, and epidemiological information. The expected result is Negative. Fact Sheet for Patients: SugarRoll.be Fact Sheet for Healthcare Providers: https://www.woods-mathews.com/ This test is not yet approved or cleared by the [place] FDA and  has been authorized for detection and/or diagnosis of SARS-CoV-2 by FDA under an Emergency Use Authorization (EUA). This EUA will remain  in effect (meaning this test can be used) for the duration of the COVID-19 declaration under Section 56 4(b)(1) of the Act, 21 U.S.C. section 360bbb-3(b)(1), unless the authorization is terminated or revoked sooner. Performed at [facility], [place] [address], [place], [place] [address]   GI pathogen panel by PCR, stool     Status: None   Collection Time: 08/12/19  1:51 PM   Specimen: Stool  Result Value Ref Range Status    Plesiomonas shigelloides NOT DETECTED NOT DETECTED Final   Yersinia enterocolitica NOT DETECTED NOT DETECTED Final   Vibrio  NOT DETECTED NOT DETECTED Final   Enteropathogenic E coli NOT DETECTED NOT DETECTED Final   E coli (ETEC) LT/ST NOT DETECTED NOT DETECTED Final   E coli 8502 by PCR Not applicable NOT DETECTED Final   Cryptosporidium by PCR NOT DETECTED NOT DETECTED Final   Entamoeba histolytica NOT DETECTED NOT DETECTED Final   Adenovirus F 40/41 NOT DETECTED NOT DETECTED Final   Norovirus GI/GII NOT DETECTED NOT DETECTED Final   Sapovirus NOT DETECTED NOT DETECTED Final    Comment: (NOTE) Performed At: [facility] [address] [place], [place] [id number] Rush Farmer MD VE:[id number]    Vibrio cholerae NOT DETECTED NOT DETECTED Final   Campylobacter by PCR NOT DETECTED NOT DETECTED Final   Salmonella by PCR NOT DETECTED NOT DETECTED Final   E coli (STEC) NOT DETECTED NOT DETECTED Final   Enteroaggregative E coli NOT DETECTED NOT DETECTED Final   Shigella by PCR NOT DETECTED NOT DETECTED Final   Cyclospora cayetanensis NOT DETECTED NOT DETECTED Final   Astrovirus NOT DETECTED NOT DETECTED Final   G lamblia by PCR NOT DETECTED NOT DETECTED Final   Rotavirus A by PCR NOT DETECTED NOT DETECTED Final  Calprotectin, Fecal     Status: Abnormal   Collection Time: 08/14/19 11:12 AM   Specimen: Stool  Result Value Ref Range Status   Calprotectin, Fecal 7,749 (H) 0 - 120 ug/g Final    Comment: (NOTE) **Results verified by repeat testing** Concentration     Interpretation   Follow-Up <16 - 50 ug/g     Normal           None >50 -120 ug/g     Borderline       Re-evaluate in 4-6 weeks    >120 ug/g     Abnormal         Repeat as clinically                                   indicated Performed At: [facility] [place], [place] [id number] Rush Farmer MD LY:[id number]   GI pathogen panel by PCR, stool     Status: None   Collection Time:  08/14/19 12:31 PM   Specimen: Stool  Result Value Ref Range Status   Plesiomonas shigelloides NOT DETECTED NOT DETECTED Final   Yersinia enterocolitica NOT DETECTED NOT DETECTED Final   Vibrio NOT DETECTED NOT DETECTED Final   Enteropathogenic E coli NOT DETECTED NOT DETECTED Final   E coli (ETEC) LT/ST NOT DETECTED NOT DETECTED Final   E coli 1275 by PCR Not applicable NOT DETECTED Final   Cryptosporidium by PCR NOT DETECTED NOT DETECTED Final   Entamoeba histolytica NOT DETECTED NOT DETECTED Final   Adenovirus F 40/41 NOT DETECTED NOT DETECTED Final   Norovirus GI/GII NOT DETECTED NOT DETECTED Final   Sapovirus NOT DETECTED NOT DETECTED Final    Comment: (NOTE) Performed At: [facility] [place], [place] [id number] Rush Farmer MD WH:[id number]    Vibrio cholerae NOT DETECTED NOT DETECTED Final   Campylobacter by PCR NOT DETECTED NOT DETECTED Final   Salmonella by PCR NOT DETECTED NOT DETECTED Final   E coli (STEC) NOT DETECTED NOT DETECTED Final   Enteroaggregative E coli NOT DETECTED NOT DETECTED Final   Shigella by PCR NOT DETECTED NOT DETECTED Final   Cyclospora cayetanensis NOT DETECTED NOT DETECTED Final   Astrovirus NOT DETECTED NOT  DETECTED Final   G lamblia by PCR NOT DETECTED NOT DETECTED Final   Rotavirus A by PCR NOT DETECTED NOT DETECTED Final    Please note: You were cared for by a hospitalist during your hospital stay. Once you are discharged, your primary care physician will handle any further medical issues. Please note that NO REFILLS for any discharge medications will be authorized once you are discharged, as it is imperative that you return to your primary care physician (or establish a relationship with a primary care physician if you do not have one) for your post hospital discharge needs so that they can reassess your need for medications and monitor your lab values.    Time coordinating discharge: 40 minutes  SIGNED:   Shelly Coss, MD  [facility] 08/18/2019, 11:47 AM Pager [phone]  If 7PM-7AM, please contact night-coverage [email or web address] Password [email or web address]

## 2019-08-18 NOTE — Interval H&P Note (Signed)
History and Physical Interval Note:  08/18/2019 8:44 AM  [name]  has presented today for surgery, with the diagnosis of colitis, anemia.  The various methods of treatment have been discussed with the patient and family. After consideration of risks, benefits and other options for treatment, the patient has consented to  Procedure(s): COLONOSCOPY WITH PROPOFOL (N/A) ESOPHAGOGASTRODUODENOSCOPY (EGD) (N/A) as a surgical intervention.  The patient's history has been reviewed, patient examined, no change in status, stable for surgery.  I have reviewed the patient's chart and labs.  Questions were answered to the patient's satisfaction.     Jackquline Denmark

## 2019-08-18 NOTE — Transfer of Care (Signed)
Immediate Anesthesia Transfer of Care Note  Patient: [name]  Procedure(s) Performed: ESOPHAGOGASTRODUODENOSCOPY (EGD) (N/A ) COLONOSCOPY (N/A ) BIOPSY POLYPECTOMY  Patient Location: PACU  Anesthesia Type:MAC  Level of Consciousness: awake, alert  and oriented  Airway & Oxygen Therapy: Patient Spontanous Breathing and Patient connected to face mask oxygen  Post-op Assessment: Report given to RN and Post -op Vital signs reviewed and stable  Post vital signs: Reviewed and stable  Last Vitals:  Vitals Value Taken Time  BP 131/54 08/18/19 0954  Temp 36.5 C 08/18/19 0954  Pulse 80 08/18/19 0956  Resp 19 08/18/19 0956  SpO2 100 % 08/18/19 0956  Vitals shown include unvalidated device data.  Last Pain:  Vitals:   08/18/19 0954  TempSrc: Temporal  PainSc: 0-No pain      Patients Stated Pain Goal: 3 (07/62/26 3335)  Complications: No apparent anesthesia complications

## 2019-08-18 NOTE — Progress Notes (Signed)
Results of colonoscopy noted - probably UC Will obtain TPMT, Quantiferon Gold, hepatitis serologies.  Home today. Follow up appt with me on 09/08/19 at 11am Upon discharge please give :  Lialda 2.4 gram daily, Prednisone 40 mg daily x 7 days then 30 mg x 1 week then stay on 20 mg daily until follow up appt with me in 3 weeks.

## 2019-08-18 NOTE — Anesthesia Postprocedure Evaluation (Signed)
Anesthesia Post Note  Patient: [name]  Procedure(s) Performed: ESOPHAGOGASTRODUODENOSCOPY (EGD) (N/A ) COLONOSCOPY (N/A ) BIOPSY POLYPECTOMY     Patient location during evaluation: Endoscopy Anesthesia Type: MAC Level of consciousness: awake and alert Pain management: pain level controlled Vital Signs Assessment: post-procedure vital signs reviewed and stable Respiratory status: spontaneous breathing, nonlabored ventilation, respiratory function stable and patient connected to nasal cannula oxygen Cardiovascular status: stable and blood pressure returned to baseline Postop Assessment: no apparent nausea or vomiting Anesthetic complications: no    Last Vitals:  Vitals:   08/18/19 1000 08/18/19 1010  BP: 140/62 137/63  Pulse: 77 74  Resp: (!) 22 19  Temp:    SpO2: 100% 100%    Last Pain:  Vitals:   08/18/19 1010  TempSrc:   PainSc: 0-No pain                 [name] P [name]

## 2019-08-18 NOTE — Op Note (Addendum)
[facility] Patient Name: [name] Procedure Date: 08/18/2019 MRN: [id number] Attending MD: Jackquline Denmark , MD Date of Birth: 1955/11/30 CSN: [id number] Age: 63 Admit Type: Inpatient Procedure:                Upper GI endoscopy Indications:              N/V Providers:                Jackquline Denmark, MD, Ashley Jacobs, RN, Marguerita Merles, Technician Referring MD:              Medicines:                Monitored Anesthesia Care Complications:            No immediate complications. Estimated Blood Loss:     Estimated blood loss: none. Procedure:                Pre-Anesthesia Assessment:                           - Prior to the procedure, a History and Physical                            was performed, and patient medications and                            allergies were reviewed. The patient's tolerance of                            previous anesthesia was also reviewed. The risks                            and benefits of the procedure and the sedation                            options and risks were discussed with the patient.                            All questions were answered, and informed consent                            was obtained. Prior Anticoagulants: The patient has                            taken no previous anticoagulant or antiplatelet                            agents. ASA Grade Assessment: II - A patient with                            mild systemic disease. After reviewing the risks                            and benefits,  the patient was deemed in                            satisfactory condition to undergo the procedure.                           After obtaining informed consent, the endoscope was                            passed under direct vision. Throughout the                            procedure, the patient's blood pressure, pulse, and                            oxygen saturations were monitored continuously.  The                            GIF-H190 ([id number] Olympus gastroscope was                            introduced through the mouth, and advanced to the                            second part of duodenum. The upper GI endoscopy was                            accomplished without difficulty. The patient                            tolerated the procedure well. Scope In: Scope Out: Findings:      The examined esophagus was normal.      The Z-line was regular and was found 38 cm from the incisors.      The entire examined stomach was normal. Biopsies were taken with a cold       forceps for histology.      Localized mildly erythematous mucosa without active bleeding and with no       stigmata of bleeding was found in the first portion of the duodenum.       Multiple biopsies were obtained and sent for histology Impression:               -Mild duodenitis                           -Otherwise normal EGD. Moderate Sedation:      none Recommendation:           - Return patient to hospital ward for ongoing care.                           - Resume previous diet.                           - Continue present medications.                           -  Await pathology results.                           - No aspirin, ibuprofen, naproxen, or other                            non-steroidal anti-inflammatory drugs. Procedure Code(s):        --- Professional ---                           [phone], Esophagogastroduodenoscopy, flexible,                            transoral; with biopsy, single or multiple Diagnosis Code(s):        --- Professional ---                           K31.89, Other diseases of stomach and duodenum                           M35.36, Cyclical vomiting syndrome unrelated to                            migraine CPT copyright 2019 American Medical Association. All rights reserved. The codes documented in this report are preliminary and upon coder review may  be revised to meet current  compliance requirements. Jackquline Denmark, MD 08/18/2019 9:56:49 AM This report has been signed electronically. Number of Addenda: 0

## 2019-08-18 NOTE — Anesthesia Procedure Notes (Signed)
Procedure Name: MAC Date/Time: 08/18/2019 9:04 AM Performed by: Eben Burow, CRNA Pre-anesthesia Checklist: Patient identified, Emergency Drugs available, Suction available, Patient being monitored and Timeout performed Oxygen Delivery Method: Simple face mask Dental Injury: Teeth and Oropharynx as per pre-operative assessment

## 2019-08-22 ENCOUNTER — Encounter: Payer: Self-pay | Admitting: Gastroenterology

## 2019-08-22 ENCOUNTER — Other Ambulatory Visit: Payer: Self-pay

## 2019-08-22 LAB — SURGICAL PATHOLOGY

## 2019-08-29 ENCOUNTER — Ambulatory Visit: Payer: 59 | Admitting: Physician Assistant

## 2019-09-08 ENCOUNTER — Encounter: Payer: Self-pay | Admitting: Nurse Practitioner

## 2019-09-08 ENCOUNTER — Other Ambulatory Visit (INDEPENDENT_AMBULATORY_CARE_PROVIDER_SITE_OTHER): Payer: BC Managed Care – PPO

## 2019-09-08 ENCOUNTER — Ambulatory Visit: Payer: BC Managed Care – PPO | Admitting: Nurse Practitioner

## 2019-09-08 VITALS — BP 134/66 | HR 67 | Temp 97.6°F | Ht 65.0 in | Wt 133.0 lb

## 2019-09-08 DIAGNOSIS — K5100 Ulcerative (chronic) pancolitis without complications: Secondary | ICD-10-CM

## 2019-09-08 LAB — CBC
HCT: 36.9 % (ref 36.0–46.0)
Hemoglobin: 12.2 g/dL (ref 12.0–15.0)
MCHC: 33 g/dL (ref 30.0–36.0)
MCV: 90 fl (ref 78.0–100.0)
Platelets: 229 10*3/uL (ref 150.0–400.0)
RBC: 4.1 Mil/uL (ref 3.87–5.11)
RDW: 16.3 % — ABNORMAL HIGH (ref 11.5–15.5)
WBC: 11.6 10*3/uL — ABNORMAL HIGH (ref 4.0–10.5)

## 2019-09-08 LAB — HIGH SENSITIVITY CRP: CRP, High Sensitivity: 2.36 mg/L (ref 0.000–5.000)

## 2019-09-08 MED ORDER — MESALAMINE 1.2 G PO TBEC: 2.4000 g | DELAYED_RELEASE_TABLET | Freq: Two times a day (BID) | ORAL | 1 refills | Status: DC

## 2019-09-08 NOTE — Progress Notes (Signed)
09/08/2019 Edrick Oh [id number] [birth year]   History of Present Illness: [name] is a 64 year old female who presents today for follow-up after being admitted to the hospital with pancolitis.  She had upper abdominal pain with diarrhea for approximately 4 weeks which developed into bloody diarrhea.  She initially presented to [facility] emergency room 08/06/2019 and she was diagnosed with colitis treated with Cipro and Flagyl without improvement.  Her abdominal pain worsened and she continued to have bloody diarrhea.  She was admitted to [facility] 08/11/2019.  An abdominal/pelvic CT identified thickening throughout the colon, mild diverticulosis without diverticulitis and questionable cholelithiasis to the neck of the gallbladder.  A right upper quadrant ultrasound was normal.  Panel was negative.  Lipase levels were elevated 10 4-1 52 without evidence of pancreatitis on CT.  She underwent an EGD and colonoscopy by Dr. Lyndel Safe on 08/18/2019.  The EGD identified mild peptic duodenitis.  The colonoscopy identified pancolitis, 2 sessile hyperplastic polyps were removed from the rectum and sigmoid, the TI was normal.  Colon biopsies identified moderately active chronic colitis, negative for granulomata and dysplasia.  The colon biopsies were more suggestive of UC than Crohn's disease.  She was discharged home on 08/18/2019.  She was prescribed a Prednisone 40 mg taper and Lialda 2.4 g p.o. daily.  She was advised to follow-up in the office in 2 to 3 weeks.  She reports feeling quite well at this time.   She is passing a normal formed brown bowel movement once daily.  No further rectal bleeding or mucus per the rectum.  Her prescription for Lialda ran out 2 days ago and she questioned if she needed to continue this medication.  She developed mild central abdominal discomfort yesterday while off Lialda but denies any significant abdominal pain today.  She lost 18 pounds during her  hospitalization course.  She has gained back 6 pounds.  She is tolerating a regular diet.  No other complaints today.   CBC Latest Ref Rng & Units 08/15/2019 08/12/2019 08/11/2019  WBC 4.0 - 10.5 K/uL 8.8 9.2 6.9  Hemoglobin 12.0 - 15.0 g/dL 10.8(L) 11.6(L) 13.0  Hematocrit 36.0 - 46.0 % 32.6(L) 35.0(L) 39.7  Platelets 150 - 400 K/uL 223 203 220   CMP Latest Ref Rng & Units 08/17/2019 08/16/2019 08/15/2019  Glucose 70 - 99 mg/dL 104(H) 122(H) 111(H)  BUN 8 - 23 mg/dL <5(L) <5(L) <5(L)  Creatinine 0.44 - 1.00 mg/dL 0.55 0.65 0.62  Sodium 135 - 145 mmol/L 136 138 137  Potassium 3.5 - 5.1 mmol/L 3.9 3.6 3.7  Chloride 98 - 111 mmol/L 106 108 108  CO2 22 - 32 mmol/L 24 23 22   Calcium 8.9 - 10.3 mg/dL 7.9(L) 7.6(L) 7.7(L)  Total Protein 6.5 - 8.1 g/dL - - -  Total Bilirubin 0.3 - 1.2 mg/dL - - -  Alkaline Phos 38 - 126 U/L - - -  AST 15 - 41 U/L - - -  ALT 0 - 44 U/L - - -    Past Medical History:  Diagnosis Date  . Anxiety   . Depression   . Migraine   . Palpitations   . Pancreatitis   . Ulcerative colitis [facility])    Past Surgical History:  Procedure Laterality Date  . APPENDECTOMY Right   . BIOPSY  08/18/2019   Procedure: BIOPSY;  Surgeon: Jackquline Denmark, MD;  Location: [facility] ENDOSCOPY;  Service: Endoscopy;;  . COLONOSCOPY N/A 08/18/2019  Procedure: COLONOSCOPY;  Surgeon: Jackquline Denmark, MD;  Location: [facility];  Service: Endoscopy;  Laterality: N/A;  . ESOPHAGOGASTRODUODENOSCOPY N/A 08/18/2019   Procedure: ESOPHAGOGASTRODUODENOSCOPY (EGD);  Surgeon: Jackquline Denmark, MD;  Location: [facility];  Service: Endoscopy;  Laterality: N/A;  . POLYPECTOMY  08/18/2019   Procedure: POLYPECTOMY;  Surgeon: Jackquline Denmark, MD;  Location: [facility];  Service: Endoscopy;;  . TONSILLECTOMY     Current Outpatient Medications on File Prior to Visit  Medication Sig Dispense Refill  . ALPRAZolam (XANAX) 0.25 MG tablet Take 1 tablet (0.25 mg total) by mouth at bedtime as needed for anxiety.  TAKE 1 TABLET BY MOUTH TWICE A DAY AS NEEDED 30 tablet 2  . buPROPion (WELLBUTRIN XL) 150 MG 24 hr tablet TAKE 1 TABLET BY MOUTH EVERY DAY 90 tablet 1  . Multiple Vitamin (MULTIVITAMIN) tablet Take 1 tablet by mouth daily.    . predniSONE (DELTASONE) 10 MG tablet Take 4 pills daily for 7 days then 3 pills daily for 7 days then continue taking 2 pills a day until follow-up with gastroenterology 100 tablet 0   No current facility-administered medications on file prior to visit.   No Known Allergies   Current Medications, Allergies, Past Medical History, Past Surgical History, Family History and Social History were reviewed in [employer] record.   Physical Exam: BP 134/66   Pulse 67   Temp 97.6 F (36.4 C)   Ht 5' 5"  (1.651 m)   Wt 133 lb (60.3 kg)   BMI 22.13 kg/m  General: Well developed 64 year old female in no acute distress Head: Normocephalic and atraumatic Eyes:  sclerae anicteric, conjunctiva pink  Ears: Normal auditory acuity Lungs: Clear throughout to auscultation Heart: Regular rate and rhythm Abdomen: Soft, non tender and non distended. No masses, no hepatomegaly. Normal bowel sounds Rectal: Deferred. Musculoskeletal: Symmetrical with no gross deformities  Extremities: No edema  Neurological: Alert oriented x 4, grossly nonfocal Psychological:  Alert and cooperative. Normal mood and affect  Assessment and Recommendations: 34. 64 year old female with indeterminate pancolitis, most likely UC. -Increase Lialda 1.2 gm two tabs po bid due to having pancolitis, she had mild central abdominal pain when off Lialda for 2 days. -TPMT, Quantiferon Gold, Hep B surf ag, Hep B core total  and Hep B surf antibody (required if immune modulator or biologics needed in the future).  -CBC, CRP -Patient wishes to transition her GI care to Dr. Lyndel Safe. Follow up with Dr. Lyndel Safe in 2 months. Patient is aware  Dr. Steve Rattler office is in [facility].  -Patient to call  office if her abdominal pain or bloody diarrhea recurs  2.  Normocytic Anemia -Repeat CBC

## 2019-09-08 NOTE — Patient Instructions (Addendum)
If you are age 64 or older, your body mass index should be between 23-30. Your Body mass index is 22.13 kg/m. If this is out of the aforementioned range listed, please consider follow up with your Primary Care Provider.  If you are age 27 or younger, your body mass index should be between 19-25. Your Body mass index is 22.13 kg/m. If this is out of the aformentioned range listed, please consider follow up with your Primary Care Provider.   Your provider has requested that you go to the basement level for lab work before leaving today. Press "B" on the elevator. The lab is located at the first door on the left as you exit the elevator.  Continue Lialda 1.2 grams two twice daily.  Finish Prednisone taper.  Call if abdominal pain or bloody stools recur.  Follow up with Dr. Lyndel Safe in [facility] in 8 weeks.  Please call the [facility] office for an appointment as the schedule is not available at this time.  Thank you for choosing me and [facility].

## 2019-09-09 NOTE — Telephone Encounter (Signed)
See my chart msg sent to pt

## 2019-09-10 LAB — QUANTIFERON-TB GOLD PLUS
Mitogen-NIL: >10 IU/mL
NIL: 0.03 IU/mL
QuantiFERON-TB Gold Plus: NEGATIVE
TB1-NIL: 0 IU/mL
TB2-NIL: 0 IU/mL

## 2019-09-11 NOTE — Progress Notes (Signed)
Agree with the nice plan Will determine need for Biologics on follow-up visit. RG

## 2019-09-15 LAB — HEPATITIS B SURFACE ANTIGEN: Hepatitis B Surface Ag: NONREACTIVE

## 2019-09-15 LAB — HEPATITIS B CORE ANTIBODY, TOTAL: Hep B Core Total Ab: NONREACTIVE

## 2019-09-15 LAB — HEPATITIS B SURFACE ANTIBODY,QUALITATIVE: Hep B S Ab: NONREACTIVE

## 2019-09-15 LAB — THIOPURINE METHYLTRANSFERASE (TPMT), RBC: Thiopurine Methyltransferase, RBC: 15 nmol/hr/mL RBC

## 2019-10-07 ENCOUNTER — Other Ambulatory Visit: Payer: Self-pay | Admitting: Nurse Practitioner

## 2019-11-02 ENCOUNTER — Other Ambulatory Visit: Payer: Self-pay | Admitting: Nurse Practitioner

## 2019-11-04 ENCOUNTER — Other Ambulatory Visit: Payer: Self-pay | Admitting: Internal Medicine

## 2019-11-28 ENCOUNTER — Other Ambulatory Visit: Payer: Self-pay | Admitting: Nurse Practitioner

## 2019-12-06 ENCOUNTER — Other Ambulatory Visit: Payer: Self-pay | Admitting: Family

## 2019-12-07 ENCOUNTER — Encounter: Payer: Self-pay | Admitting: Gastroenterology

## 2019-12-07 ENCOUNTER — Other Ambulatory Visit: Payer: Self-pay

## 2019-12-07 ENCOUNTER — Ambulatory Visit: Payer: BC Managed Care – PPO | Admitting: Gastroenterology

## 2019-12-07 VITALS — BP 118/78 | HR 73 | Temp 97.0°F | Ht 65.0 in | Wt 139.0 lb

## 2019-12-07 DIAGNOSIS — K5100 Ulcerative (chronic) pancolitis without complications: Secondary | ICD-10-CM

## 2019-12-07 NOTE — Progress Notes (Signed)
Chief Complaint:   Referring Provider:  Isaac Bliss, Estel*      ASSESSMENT AND PLAN;   #1.  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  Recent exacerbation 07/2019, treated with prednisone and then maintained on mesalamine.  Plan: - mesalamine 2.4g po bid to continue. - FU in 6 months.  At follow-up, recheck CBC, CMP, CRP, stool for calprotectin. Then, can reduce dose of mesalamine. - Call us if with any problems until follow-up - Plan colon for 08/2020 for high risk colorectal cancer screening.   HPI:    [name] is a 64 y.o. female  For follow-up visit Doing great on mesalamine Does not want to start Biologics or Imuran (due to side effects) Stool calprotectin and C-reactive protein was normal.  Back to baseline with bowel movements 1/day mostly solids, in the morning.  Denies having any melena or hematochezia.  No diarrhea.  No fever chills or night sweats.  No weight loss.  Blood tests including TB Gold, TPMT, CBC, C-reactive protein, CMP were within normal limits.  Last colonoscopy 08/18/2019: Moderately active pancolitis.  Normal TI.  CT AP 07/2019 1. Mild wall thickening suggested throughout the colon.  2. There are sigmoid colon diverticula but no evidence of diverticulitis. 3. No other acute finding within the abdomen pelvis. Prominent to mildly enlarged gastrohepatic ligament lymph nodes, most likely chronic and reactive. 4. Possible small stones in the gallbladder neck and cystic duct, but no evidence of acute cholecystitis.  [place] 07/2019 No visible cholelithiasis, choledocholithiasis, biliary dilatation or features of acute cholecystitis.  Past Medical History:  Diagnosis Date  . Anxiety   . Depression   . Migraine   . Palpitations   . Pancreatitis   . Ulcerative colitis [facility])     Past Surgical History:  Procedure Laterality Date  . APPENDECTOMY Right   . BIOPSY  08/18/2019   Procedure: BIOPSY;  Surgeon: Jackquline Denmark, MD;  Location: [facility];   Service: Endoscopy;;  . COLONOSCOPY N/A 08/18/2019   Procedure: COLONOSCOPY;  Surgeon: Jackquline Denmark, MD;  Location: [facility];  Service: Endoscopy;  Laterality: N/A;  . ESOPHAGOGASTRODUODENOSCOPY N/A 08/18/2019   Procedure: ESOPHAGOGASTRODUODENOSCOPY (EGD);  Surgeon: Jackquline Denmark, MD;  Location: [facility];  Service: Endoscopy;  Laterality: N/A;  . POLYPECTOMY  08/18/2019   Procedure: POLYPECTOMY;  Surgeon: Jackquline Denmark, MD;  Location: [facility];  Service: Endoscopy;;  . TONSILLECTOMY      Family History  Problem Relation Age of Onset  . Prostate cancer Father   . Hypertension Mother     Social History   Tobacco Use  . Smoking status: Current Every Day Smoker    Packs/day: 1.00    Types: Cigarettes  . Smokeless tobacco: Never Used  . Tobacco comment: quit date: 04/23/2015. ADE with Chantix  Substance Use Topics  . Alcohol use: Yes    Alcohol/week: 0.0 standard drinks  . Drug use: No    Current Outpatient Medications  Medication Sig Dispense Refill  . ALPRAZolam (XANAX) 0.25 MG tablet TAKE 1 TABLET (0.25MG) BY MOUTH AT BEDTIME AS NEEDED FOR ANXIETY. TAKE 1 TABLET BY MOUTH TWICE A DAY AS NEEDED. 30 tablet 0  . buPROPion (WELLBUTRIN XL) 150 MG 24 hr tablet TAKE 1 TABLET BY MOUTH EVERY DAY 90 tablet 1  . calcium-vitamin D 250-100 MG-UNIT tablet Take 1 tablet by mouth 2 (two) times daily.    . mesalamine (LIALDA) 1.2 g EC tablet TAKE 2 TABLETS (2.4 G TOTAL) BY MOUTH 2 (TWO) TIMES  DAILY. 120 tablet 1  . Multiple Vitamin (MULTIVITAMIN) tablet Take 1 tablet by mouth daily.     No current facility-administered medications for this visit.    No Known Allergies  Review of Systems:  Constitutional: Denies fever, chills, diaphoresis, appetite change and has fatigue.  Hematological: Denies adenopathy. Easy bruising, personal or family bleeding history  Psychiatric/Behavioral: No anxiety or depression     Physical Exam:    BP 118/78   Pulse 73   Temp (!) 97 F (36.1  C)   Ht 5' 5"  (1.651 m)   Wt 139 lb (63 kg)   BMI 23.13 kg/m  Wt Readings from Last 3 Encounters:  12/07/19 139 lb (63 kg)  09/08/19 133 lb (60.3 kg)  08/12/19 125 lb 10.6 oz (57 kg)   Constitutional:  Well-developed, in no acute distress. Psychiatric: Normal mood and affect. Behavior is normal. HEENT: Pupils normal.  Conjunctivae are normal. No scleral icterus. Neck supple.  Cardiovascular: Normal rate, regular rhythm. No edema Pulmonary/chest: Effort normal and breath sounds normal. No wheezing, rales or rhonchi. Abdominal: Soft, nondistended. Nontender. Bowel sounds active throughout. There are no masses palpable. No hepatomegaly. Rectal:  defered Neurological: Alert and oriented to person place and time. Skin: Skin is warm and dry. No rashes noted.  Data Reviewed: I have personally reviewed following labs and imaging studies  CBC: CBC Latest Ref Rng & Units 09/08/2019 08/15/2019 08/12/2019  WBC 4.0 - 10.5 K/uL 11.6(H) 8.8 9.2  Hemoglobin 12.0 - 15.0 g/dL 12.2 10.8(L) 11.6(L)  Hematocrit 36.0 - 46.0 % 36.9 32.6(L) 35.0(L)  Platelets 150.0 - 400.0 K/uL 229.0 223 203    CMP: CMP Latest Ref Rng & Units 08/17/2019 08/16/2019 08/15/2019  Glucose 70 - 99 mg/dL 104(H) 122(H) 111(H)  BUN 8 - 23 mg/dL <5(L) <5(L) <5(L)  Creatinine 0.44 - 1.00 mg/dL 0.55 0.65 0.62  Sodium 135 - 145 mmol/L 136 138 137  Potassium 3.5 - 5.1 mmol/L 3.9 3.6 3.7  Chloride 98 - 111 mmol/L 106 108 108  CO2 22 - 32 mmol/L 24 23 22   Calcium 8.9 - 10.3 mg/dL 7.9(L) 7.6(L) 7.7(L)  Total Protein 6.5 - 8.1 g/dL - - -  Total Bilirubin 0.3 - 1.2 mg/dL - - -  Alkaline Phos 38 - 126 U/L - - -  AST 15 - 41 U/L - - -  ALT 0 - 44 U/L - - -      Carmell Austria, MD 12/07/2019, 10:59 AM  Cc: Isaac Bliss, Estel*

## 2019-12-07 NOTE — Patient Instructions (Signed)
If you are age 64 or older, your body mass index should be between 23-30. Your Body mass index is 23.13 kg/m. If this is out of the aforementioned range listed, please consider follow up with your Primary Care Provider.  If you are age 22 or younger, your body mass index should be between 19-25. Your Body mass index is 23.13 kg/m. If this is out of the aformentioned range listed, please consider follow up with your Primary Care Provider.    Follow up in 6 months.   Thank you,  Dr. Jackquline Denmark

## 2019-12-08 NOTE — Telephone Encounter (Signed)
Medication Refill: [place]: [place]: [phone]

## 2019-12-22 ENCOUNTER — Other Ambulatory Visit: Payer: Self-pay | Admitting: Nurse Practitioner

## 2019-12-26 ENCOUNTER — Other Ambulatory Visit: Payer: Self-pay | Admitting: Internal Medicine

## 2019-12-26 DIAGNOSIS — H04123 Dry eye syndrome of bilateral lacrimal glands: Secondary | ICD-10-CM | POA: Diagnosis not present

## 2019-12-26 DIAGNOSIS — H527 Unspecified disorder of refraction: Secondary | ICD-10-CM | POA: Diagnosis not present

## 2019-12-26 DIAGNOSIS — H02831 Dermatochalasis of right upper eyelid: Secondary | ICD-10-CM | POA: Diagnosis not present

## 2019-12-26 DIAGNOSIS — H5050 Unspecified heterophoria: Secondary | ICD-10-CM | POA: Diagnosis not present

## 2019-12-26 DIAGNOSIS — H25813 Combined forms of age-related cataract, bilateral: Secondary | ICD-10-CM | POA: Diagnosis not present

## 2020-01-15 ENCOUNTER — Other Ambulatory Visit: Payer: Self-pay | Admitting: Internal Medicine

## 2020-01-17 ENCOUNTER — Other Ambulatory Visit: Payer: Self-pay | Admitting: Nurse Practitioner

## 2020-01-17 ENCOUNTER — Encounter: Payer: BC Managed Care – PPO | Admitting: Internal Medicine

## 2020-01-26 ENCOUNTER — Other Ambulatory Visit: Payer: Self-pay

## 2020-01-27 ENCOUNTER — Encounter: Payer: BC Managed Care – PPO | Admitting: Internal Medicine

## 2020-01-27 ENCOUNTER — Ambulatory Visit (INDEPENDENT_AMBULATORY_CARE_PROVIDER_SITE_OTHER): Payer: BC Managed Care – PPO | Admitting: Internal Medicine

## 2020-01-27 DIAGNOSIS — Z1231 Encounter for screening mammogram for malignant neoplasm of breast: Secondary | ICD-10-CM

## 2020-01-27 DIAGNOSIS — Z124 Encounter for screening for malignant neoplasm of cervix: Secondary | ICD-10-CM | POA: Diagnosis not present

## 2020-01-27 DIAGNOSIS — Z0000 Encounter for general adult medical examination without abnormal findings: Secondary | ICD-10-CM

## 2020-01-27 DIAGNOSIS — R7989 Other specified abnormal findings of blood chemistry: Secondary | ICD-10-CM | POA: Diagnosis not present

## 2020-01-27 DIAGNOSIS — F411 Generalized anxiety disorder: Secondary | ICD-10-CM | POA: Diagnosis not present

## 2020-01-27 DIAGNOSIS — F320 Major depressive disorder, single episode, mild: Secondary | ICD-10-CM

## 2020-01-27 DIAGNOSIS — K5100 Ulcerative (chronic) pancolitis without complications: Secondary | ICD-10-CM

## 2020-01-27 DIAGNOSIS — Z23 Encounter for immunization: Secondary | ICD-10-CM | POA: Diagnosis not present

## 2020-01-27 LAB — CBC WITH DIFFERENTIAL/PLATELET
Basophils Absolute: 0 10*3/uL (ref 0.0–0.1)
Basophils Relative: 0.6 % (ref 0.0–3.0)
Eosinophils Absolute: 0 10*3/uL (ref 0.0–0.7)
Eosinophils Relative: 0.5 % (ref 0.0–5.0)
HCT: 39.2 % (ref 36.0–46.0)
Hemoglobin: 13.5 g/dL (ref 12.0–15.0)
Lymphocytes Relative: 33.7 % (ref 12.0–46.0)
Lymphs Abs: 2.1 10*3/uL (ref 0.7–4.0)
MCHC: 34.3 g/dL (ref 30.0–36.0)
MCV: 89 fl (ref 78.0–100.0)
Monocytes Absolute: 0.5 10*3/uL (ref 0.1–1.0)
Monocytes Relative: 8.6 % (ref 3.0–12.0)
Neutro Abs: 3.5 10*3/uL (ref 1.4–7.7)
Neutrophils Relative %: 56.6 % (ref 43.0–77.0)
Platelets: 201 10*3/uL (ref 150.0–400.0)
RBC: 4.4 Mil/uL (ref 3.87–5.11)
RDW: 13.3 % (ref 11.5–15.5)
WBC: 6.1 10*3/uL (ref 4.0–10.5)

## 2020-01-27 LAB — COMPREHENSIVE METABOLIC PANEL
ALT: 16 U/L (ref 0–35)
AST: 15 U/L (ref 0–37)
Albumin: 4.5 g/dL (ref 3.5–5.2)
Alkaline Phosphatase: 60 U/L (ref 39–117)
BUN: 26 mg/dL — ABNORMAL HIGH (ref 6–23)
CO2: 27 mEq/L (ref 19–32)
Calcium: 9.1 mg/dL (ref 8.4–10.5)
Chloride: 106 mEq/L (ref 96–112)
Creatinine, Ser: 0.88 mg/dL (ref 0.40–1.20)
GFR: 64.81 mL/min (ref >60.00)
Glucose, Bld: 99 mg/dL (ref 70–99)
Potassium: 4.4 mEq/L (ref 3.5–5.1)
Sodium: 139 mEq/L (ref 135–145)
Total Bilirubin: 0.5 mg/dL (ref 0.2–1.2)
Total Protein: 6.7 g/dL (ref 6.0–8.3)

## 2020-01-27 LAB — LIPID PANEL
Cholesterol: 228 mg/dL — ABNORMAL HIGH (ref 0–200)
HDL: 61.4 mg/dL (ref >39.00)
LDL Cholesterol: 156 mg/dL — ABNORMAL HIGH (ref 0–99)
NonHDL: 167.07
Total CHOL/HDL Ratio: 4
Triglycerides: 56 mg/dL (ref 0.0–149.0)
VLDL: 11.2 mg/dL (ref 0.0–40.0)

## 2020-01-27 LAB — TSH: TSH: 0.75 u[IU]/mL (ref 0.35–4.50)

## 2020-01-27 LAB — HEMOGLOBIN A1C: Hgb A1c MFr Bld: 5.6 % (ref 4.6–6.5)

## 2020-01-27 LAB — VITAMIN D 25 HYDROXY (VIT D DEFICIENCY, FRACTURES): VITD: 55.62 ng/mL (ref 30.00–100.00)

## 2020-01-27 LAB — VITAMIN B12: Vitamin B-12: 1235 pg/mL — ABNORMAL HIGH (ref 211–911)

## 2020-01-27 MED ORDER — BUPROPION HCL ER (XL) 300 MG PO TB24: 300.0000 mg | ORAL_TABLET | Freq: Every day | ORAL | 1 refills | Status: DC

## 2020-01-27 MED ORDER — ALPRAZOLAM 0.25 MG PO TABS: ORAL_TABLET | ORAL | 0 refills | Status: DC

## 2020-01-27 NOTE — Addendum Note (Signed)
Addended by: Westley Hummer B on: 01/27/2020 05:23 PM   Modules accepted: Orders

## 2020-01-27 NOTE — Patient Instructions (Signed)
-Nice seeing you today!!  -Lab work today; will notify you once results are available.  -Increase wellbutrin to 300 mg daily.  -First shingles vaccine today.  -Schedule follow up in 8 weeks.   Preventive Care 41-64 Years Old, Female Preventive care refers to visits with your health care provider and lifestyle choices that can promote health and wellness. This includes:  A yearly physical exam. This may also be called an annual well check.  Regular dental visits and eye exams.  Immunizations.  Screening for certain conditions.  Healthy lifestyle choices, such as eating a healthy diet, getting regular exercise, not using drugs or products that contain nicotine and tobacco, and limiting alcohol use. What can I expect for my preventive care visit? Physical exam Your health care provider will check your:  Height and weight. This may be used to calculate body mass index (BMI), which tells if you are at a healthy weight.  Heart rate and blood pressure.  Skin for abnormal spots. Counseling Your health care provider may ask you questions about your:  Alcohol, tobacco, and drug use.  Emotional well-being.  Home and relationship well-being.  Sexual activity.  Eating habits.  Work and work Statistician.  Method of birth control.  Menstrual cycle.  Pregnancy history. What immunizations do I need?  Influenza (flu) vaccine  This is recommended every year. Tetanus, diphtheria, and pertussis (Tdap) vaccine  You may need a Td booster every 10 years. Varicella (chickenpox) vaccine  You may need this if you have not been vaccinated. Zoster (shingles) vaccine  You may need this after age 42. Measles, mumps, and rubella (MMR) vaccine  You may need at least one dose of MMR if you were born in 1957 or later. You may also need a second dose. Pneumococcal conjugate (PCV13) vaccine  You may need this if you have certain conditions and were not previously  vaccinated. Pneumococcal polysaccharide (PPSV23) vaccine  You may need one or two doses if you smoke cigarettes or if you have certain conditions. Meningococcal conjugate (MenACWY) vaccine  You may need this if you have certain conditions. Hepatitis A vaccine  You may need this if you have certain conditions or if you travel or work in places where you may be exposed to hepatitis A. Hepatitis B vaccine  You may need this if you have certain conditions or if you travel or work in places where you may be exposed to hepatitis B. Haemophilus influenzae type b (Hib) vaccine  You may need this if you have certain conditions. Human papillomavirus (HPV) vaccine  If recommended by your health care provider, you may need three doses over 6 months. You may receive vaccines as individual doses or as more than one vaccine together in one shot (combination vaccines). Talk with your health care provider about the risks and benefits of combination vaccines. What tests do I need? Blood tests  Lipid and cholesterol levels. These may be checked every 5 years, or more frequently if you are over 68 years old.  Hepatitis C test.  Hepatitis B test. Screening  Lung cancer screening. You may have this screening every year starting at age 27 if you have a 30-pack-year history of smoking and currently smoke or have quit within the past 15 years.  Colorectal cancer screening. All adults should have this screening starting at age 28 and continuing until age 2. Your health care provider may recommend screening at age 19 if you are at increased risk. You will have tests every 1-10  years, depending on your results and the type of screening test.  Diabetes screening. This is done by checking your blood sugar (glucose) after you have not eaten for a while (fasting). You may have this done every 1-3 years.  Mammogram. This may be done every 1-2 years. Talk with your health care provider about when you should start  having regular mammograms. This may depend on whether you have a family history of breast cancer.  BRCA-related cancer screening. This may be done if you have a family history of breast, ovarian, tubal, or peritoneal cancers.  Pelvic exam and Pap test. This may be done every 3 years starting at age 91. Starting at age 52, this may be done every 5 years if you have a Pap test in combination with an HPV test. Other tests  Sexually transmitted disease (STD) testing.  Bone density scan. This is done to screen for osteoporosis. You may have this scan if you are at high risk for osteoporosis. Follow these instructions at home: Eating and drinking  Eat a diet that includes fresh fruits and vegetables, whole grains, lean protein, and low-fat dairy.  Take vitamin and mineral supplements as recommended by your health care provider.  Do not drink alcohol if: ? Your health care provider tells you not to drink. ? You are pregnant, may be pregnant, or are planning to become pregnant.  If you drink alcohol: ? Limit how much you have to 0-1 drink a day. ? Be aware of how much alcohol is in your drink. In the [place] one drink equals one 12 oz bottle of beer (355 mL), one 5 oz glass of wine (148 mL), or one 1 oz glass of hard liquor (44 mL). Lifestyle  Take daily care of your teeth and gums.  Stay active. Exercise for at least 30 minutes on 5 or more days each week.  Do not use any products that contain nicotine or tobacco, such as cigarettes, e-cigarettes, and chewing tobacco. If you need help quitting, ask your health care provider.  If you are sexually active, practice safe sex. Use a condom or other form of birth control (contraception) in order to prevent pregnancy and STIs (sexually transmitted infections).  If told by your health care provider, take low-dose aspirin daily starting at age 75. What's next?  Visit your health care provider once a year for a well check visit.  Ask your health  care provider how often you should have your eyes and teeth checked.  Stay up to date on all vaccines. This information is not intended to replace advice given to you by your health care provider. Make sure you discuss any questions you have with your health care provider. Document Revised: 04/22/2018 Document Reviewed: 04/22/2018 Elsevier Patient Education  2020 [employer].

## 2020-01-27 NOTE — Progress Notes (Signed)
Established Patient Office Visit     This visit occurred during the SARS-CoV-2 public health emergency.  Safety protocols were in place, including screening questions prior to the visit, additional usage of staff PPE, and extensive cleaning of exam room while observing appropriate contact time as indicated for disinfecting solutions.    CC/Reason for Visit: Annual preventive exam, discuss depression  HPI: [name] is a 64 y.o. female who is coming in today for the above mentioned reasons. Past Medical History is significant for: Depression, ulcerative colitis followed by GI on mesalamine, insomnia on benzodiazepine. She feels lately like her mood is depressed. She has been hospitalized three times for her colitis. She has routine eye and dental care. She is due for her shingles vaccine, she received her Covid vaccine, she had a colonoscopy in December, her mammogram is updated, she has not had GYN care in years.   Past Medical/Surgical History: Past Medical History:  Diagnosis Date  . Anxiety   . Depression   . Migraine   . Palpitations   . Pancreatitis   . Ulcerative colitis [facility])     Past Surgical History:  Procedure Laterality Date  . APPENDECTOMY Right   . BIOPSY  08/18/2019   Procedure: BIOPSY;  Surgeon: Jackquline Denmark, MD;  Location: [facility] ENDOSCOPY;  Service: Endoscopy;;  . COLONOSCOPY N/A 08/18/2019   Procedure: COLONOSCOPY;  Surgeon: Jackquline Denmark, MD;  Location: [facility] ENDOSCOPY;  Service: Endoscopy;  Laterality: N/A;  . ESOPHAGOGASTRODUODENOSCOPY N/A 08/18/2019   Procedure: ESOPHAGOGASTRODUODENOSCOPY (EGD);  Surgeon: Jackquline Denmark, MD;  Location: [facility];  Service: Endoscopy;  Laterality: N/A;  . POLYPECTOMY  08/18/2019   Procedure: POLYPECTOMY;  Surgeon: Jackquline Denmark, MD;  Location: [facility] ENDOSCOPY;  Service: Endoscopy;;  . TONSILLECTOMY      Social History:  reports that she has been smoking cigarettes. She has been smoking about 1.00 pack per day. She has  never used smokeless tobacco. She reports current alcohol use. She reports that she does not use drugs.  Allergies: No Known Allergies  Family History:  Family History  Problem Relation Age of Onset  . Prostate cancer Father   . Hypertension Mother      Current Outpatient Medications:  .  calcium-vitamin D 250-100 MG-UNIT tablet, Take 1 tablet by mouth 2 (two) times daily., Disp: , Rfl:  .  mesalamine (LIALDA) 1.2 g EC tablet, TAKE 2 TABLETS (2.4 G TOTAL) BY MOUTH 2 (TWO) TIMES DAILY., Disp: 120 tablet, Rfl: 1 .  Multiple Vitamin (MULTIVITAMIN) tablet, Take 1 tablet by mouth daily., Disp: , Rfl:  .  ALPRAZolam (XANAX) 0.25 MG tablet, TAKE 1 TABLET BY MOUTH TWICE A DAY AS NEEDED FOR ANXIETY, Disp: 90 tablet, Rfl: 0 .  buPROPion (WELLBUTRIN XL) 300 MG 24 hr tablet, Take 1 tablet (300 mg total) by mouth daily., Disp: 90 tablet, Rfl: 1  Review of Systems:  Constitutional: Denies fever, chills, diaphoresis, appetite change and fatigue.  HEENT: Denies photophobia, eye pain, redness, hearing loss, ear pain, congestion, sore throat, rhinorrhea, sneezing, mouth sores, trouble swallowing, neck pain, neck stiffness and tinnitus.   Respiratory: Denies SOB, DOE, cough, chest tightness,  and wheezing.   Cardiovascular: Denies chest pain, palpitations and leg swelling.  Gastrointestinal: Denies nausea, vomiting, abdominal pain, diarrhea, constipation, blood in stool and abdominal distention.  Genitourinary: Denies dysuria, urgency, frequency, hematuria, flank pain and difficulty urinating.  Endocrine: Denies: hot or cold intolerance, sweats, changes in hair or nails, polyuria, polydipsia. Musculoskeletal: Denies myalgias, back pain,  joint swelling, arthralgias and gait problem.  Skin: Denies pallor, rash and wound.  Neurological: Denies dizziness, seizures, syncope, weakness, light-headedness, numbness and headaches.  Hematological: Denies adenopathy. Easy bruising, personal or family bleeding  history  Psychiatric/Behavioral: Denies suicidal ideation,  confusion, nervousness, sleep disturbance and agitation    Physical Exam: There were no vitals filed for this visit.  There is no height or weight on file to calculate BMI.   Constitutional: NAD, calm, comfortable Eyes: PERRL, lids and conjunctivae normal ENMT: Mucous membranes are moist. Tympanic membrane is pearly white, no erythema or bulging. Neck: normal, supple, no masses, no thyromegaly Respiratory: clear to auscultation bilaterally, no wheezing, no crackles. Normal respiratory effort. No accessory muscle use.  Cardiovascular: Regular rate and rhythm, no murmurs / rubs / gallops. No extremity edema. 2+ pedal pulses.  Abdomen: no tenderness, no masses palpated. No hepatosplenomegaly. Bowel sounds positive.  Musculoskeletal: no clubbing / cyanosis. No joint deformity upper and lower extremities. Good ROM, no contractures. Normal muscle tone.  Skin: no rashes, lesions, ulcers. No induration Neurologic: CN 2-12 grossly intact. Sensation intact, DTR normal. Strength 5/5 in all 4.  Psychiatric: Normal judgment and insight. Alert and oriented x 3. Normal mood.    Impression and Plan:  Encounter for screening mammogram for malignant neoplasm of breast  - Plan: MM Digital Screening  Screening for cervical cancer  - Plan: Ambulatory referral to Gynecology  Encounter for preventive health examination -She has routine eye and dental care. -To receive for shingles vaccine today, otherwise immunizations are up-to-date. -Screening labs today. -Healthy lifestyle discussed in detail. -Overdue for screening mammogram, will order. -She had a colonoscopy in 2020. -Refer to GYN for well woman exam and cervical cancer screening.  Insomnia - Plan: ALPRAZolam (XANAX) 0.25 MG tablet  Current mild episode of major depressive disorder, unspecified whether recurrent ([place])     Office Visit from 01/27/2020 in [place] at  [place]  PHQ-9 Total Score  11     -Increase Wellbutrin to 300 mg daily. -Follow-up in 8 weeks.   Pancolitis ([place]) -Now on mesalamine, followed by GI    Patient Instructions  -Nice seeing you today!!  -Lab work today; will notify you once results are available.  -Increase wellbutrin to 300 mg daily.  -First shingles vaccine today.  -Schedule follow up in 8 weeks.   Preventive Care 40-72 Years Old, Female Preventive care refers to visits with your health care provider and lifestyle choices that can promote health and wellness. This includes:  A yearly physical exam. This may also be called an annual well check.  Regular dental visits and eye exams.  Immunizations.  Screening for certain conditions.  Healthy lifestyle choices, such as eating a healthy diet, getting regular exercise, not using drugs or products that contain nicotine and tobacco, and limiting alcohol use. What can I expect for my preventive care visit? Physical exam Your health care provider will check your:  Height and weight. This may be used to calculate body mass index (BMI), which tells if you are at a healthy weight.  Heart rate and blood pressure.  Skin for abnormal spots. Counseling Your health care provider may ask you questions about your:  Alcohol, tobacco, and drug use.  Emotional well-being.  Home and relationship well-being.  Sexual activity.  Eating habits.  Work and work Statistician.  Method of birth control.  Menstrual cycle.  Pregnancy history. What immunizations do I need?  Influenza (flu) vaccine  This is recommended every year. Tetanus, diphtheria,  and pertussis (Tdap) vaccine  You may need a Td booster every 10 years. Varicella (chickenpox) vaccine  You may need this if you have not been vaccinated. Zoster (shingles) vaccine  You may need this after age 4. Measles, mumps, and rubella (MMR) vaccine  You may need at least one dose of MMR if you were  born in 1957 or later. You may also need a second dose. Pneumococcal conjugate (PCV13) vaccine  You may need this if you have certain conditions and were not previously vaccinated. Pneumococcal polysaccharide (PPSV23) vaccine  You may need one or two doses if you smoke cigarettes or if you have certain conditions. Meningococcal conjugate (MenACWY) vaccine  You may need this if you have certain conditions. Hepatitis A vaccine  You may need this if you have certain conditions or if you travel or work in places where you may be exposed to hepatitis A. Hepatitis B vaccine  You may need this if you have certain conditions or if you travel or work in places where you may be exposed to hepatitis B. Haemophilus influenzae type b (Hib) vaccine  You may need this if you have certain conditions. Human papillomavirus (HPV) vaccine  If recommended by your health care provider, you may need three doses over 6 months. You may receive vaccines as individual doses or as more than one vaccine together in one shot (combination vaccines). Talk with your health care provider about the risks and benefits of combination vaccines. What tests do I need? Blood tests  Lipid and cholesterol levels. These may be checked every 5 years, or more frequently if you are over 70 years old.  Hepatitis C test.  Hepatitis B test. Screening  Lung cancer screening. You may have this screening every year starting at age 19 if you have a 30-pack-year history of smoking and currently smoke or have quit within the past 15 years.  Colorectal cancer screening. All adults should have this screening starting at age 23 and continuing until age 8. Your health care provider may recommend screening at age 76 if you are at increased risk. You will have tests every 1-10 years, depending on your results and the type of screening test.  Diabetes screening. This is done by checking your blood sugar (glucose) after you have not eaten  for a while (fasting). You may have this done every 1-3 years.  Mammogram. This may be done every 1-2 years. Talk with your health care provider about when you should start having regular mammograms. This may depend on whether you have a family history of breast cancer.  BRCA-related cancer screening. This may be done if you have a family history of breast, ovarian, tubal, or peritoneal cancers.  Pelvic exam and Pap test. This may be done every 3 years starting at age 36. Starting at age 34, this may be done every 5 years if you have a Pap test in combination with an HPV test. Other tests  Sexually transmitted disease (STD) testing.  Bone density scan. This is done to screen for osteoporosis. You may have this scan if you are at high risk for osteoporosis. Follow these instructions at home: Eating and drinking  Eat a diet that includes fresh fruits and vegetables, whole grains, lean protein, and low-fat dairy.  Take vitamin and mineral supplements as recommended by your health care provider.  Do not drink alcohol if: ? Your health care provider tells you not to drink. ? You are pregnant, may be pregnant, or are planning  to become pregnant.  If you drink alcohol: ? Limit how much you have to 0-1 drink a day. ? Be aware of how much alcohol is in your drink. In the [place] one drink equals one 12 oz bottle of beer (355 mL), one 5 oz glass of wine (148 mL), or one 1 oz glass of hard liquor (44 mL). Lifestyle  Take daily care of your teeth and gums.  Stay active. Exercise for at least 30 minutes on 5 or more days each week.  Do not use any products that contain nicotine or tobacco, such as cigarettes, e-cigarettes, and chewing tobacco. If you need help quitting, ask your health care provider.  If you are sexually active, practice safe sex. Use a condom or other form of birth control (contraception) in order to prevent pregnancy and STIs (sexually transmitted infections).  If told by  your health care provider, take low-dose aspirin daily starting at age 66. What's next?  Visit your health care provider once a year for a well check visit.  Ask your health care provider how often you should have your eyes and teeth checked.  Stay up to date on all vaccines. This information is not intended to replace advice given to you by your health care provider. Make sure you discuss any questions you have with your health care provider. Document Revised: 04/22/2018 Document Reviewed: 04/22/2018 Elsevier Patient Education  2020 [place], [place] [facility]

## 2020-01-31 ENCOUNTER — Encounter: Payer: Self-pay | Admitting: Internal Medicine

## 2020-01-31 DIAGNOSIS — E785 Hyperlipidemia, unspecified: Secondary | ICD-10-CM | POA: Insufficient documentation

## 2020-02-09 ENCOUNTER — Other Ambulatory Visit: Payer: Self-pay | Admitting: Nurse Practitioner

## 2020-02-11 ENCOUNTER — Other Ambulatory Visit: Payer: Self-pay | Admitting: Internal Medicine

## 2020-02-11 DIAGNOSIS — F411 Generalized anxiety disorder: Secondary | ICD-10-CM

## 2020-02-23 ENCOUNTER — Ambulatory Visit: Payer: Self-pay | Admitting: Obstetrics and Gynecology

## 2020-03-06 ENCOUNTER — Telehealth: Payer: Self-pay | Admitting: Nurse Practitioner

## 2020-03-06 NOTE — Telephone Encounter (Signed)
Pt is requesting a call back from a nurse to discuss a bill that she received from a visit with Colleen back in January. Colleen's NPI shows up to be from [place], [place] and pt is wanting to know if an in network NPI could be placed.   Pt has contacted the billing dept and was told they could not help and redirected her to [place].

## 2020-03-06 NOTE — Telephone Encounter (Signed)
Nursing is unfamiliar with changing the NPI and billing in general. Can you help?

## 2020-03-08 ENCOUNTER — Encounter: Payer: BC Managed Care – PPO | Admitting: Internal Medicine

## 2020-03-08 ENCOUNTER — Other Ambulatory Visit: Payer: Self-pay | Admitting: Internal Medicine

## 2020-03-08 DIAGNOSIS — F411 Generalized anxiety disorder: Secondary | ICD-10-CM

## 2020-03-13 ENCOUNTER — Other Ambulatory Visit: Payer: Self-pay | Admitting: Nurse Practitioner

## 2020-03-24 ENCOUNTER — Other Ambulatory Visit: Payer: Self-pay | Admitting: Internal Medicine

## 2020-03-27 ENCOUNTER — Other Ambulatory Visit: Payer: Self-pay | Admitting: Nurse Practitioner

## 2020-04-06 ENCOUNTER — Other Ambulatory Visit: Payer: Self-pay | Admitting: Internal Medicine

## 2020-04-06 DIAGNOSIS — F411 Generalized anxiety disorder: Secondary | ICD-10-CM

## 2020-04-21 ENCOUNTER — Other Ambulatory Visit: Payer: Self-pay

## 2020-04-21 ENCOUNTER — Emergency Department (HOSPITAL_BASED_OUTPATIENT_CLINIC_OR_DEPARTMENT_OTHER): Payer: BC Managed Care – PPO

## 2020-04-21 ENCOUNTER — Encounter (HOSPITAL_BASED_OUTPATIENT_CLINIC_OR_DEPARTMENT_OTHER): Payer: Self-pay | Admitting: Emergency Medicine

## 2020-04-21 ENCOUNTER — Emergency Department (HOSPITAL_BASED_OUTPATIENT_CLINIC_OR_DEPARTMENT_OTHER)
Admission: EM | Admit: 2020-04-21 | Discharge: 2020-04-21 | Disposition: A | Discharge status: Home / Self Care | Payer: BC Managed Care – PPO | Attending: Emergency Medicine | Admitting: Emergency Medicine

## 2020-04-21 DIAGNOSIS — M25511 Pain in right shoulder: Secondary | ICD-10-CM | POA: Diagnosis not present

## 2020-04-21 DIAGNOSIS — F17210 Nicotine dependence, cigarettes, uncomplicated: Secondary | ICD-10-CM | POA: Insufficient documentation

## 2020-04-21 DIAGNOSIS — M19011 Primary osteoarthritis, right shoulder: Secondary | ICD-10-CM | POA: Diagnosis not present

## 2020-04-21 DIAGNOSIS — Z79899 Other long term (current) drug therapy: Secondary | ICD-10-CM | POA: Diagnosis not present

## 2020-04-21 MED ORDER — NAPROXEN 375 MG PO TABS
375.0000 mg | ORAL_TABLET | Freq: Two times a day (BID) | ORAL | 0 refills | Status: DC
Start: 2020-04-21 — End: 2020-06-01

## 2020-04-21 NOTE — Discharge Instructions (Signed)
As we discussed he most likely has impingement syndrome, I want you to follow-up with Dr. Raeford Razor who is Ortho.  I want you to take the naproxen for 10 days and use the following attachments.  Please come back to the emergency department for any new or worsening concerning symptoms.  Please consult your pharmacist for any new medications or interactions with your current medications or side effects please.

## 2020-04-21 NOTE — ED Triage Notes (Signed)
Patient states that she has a 4 day hx of shoulder pain to her right shoulder - she states that she woke up with pain to her shoulder. Increased pain with movement

## 2020-04-21 NOTE — ED Provider Notes (Addendum)
[facility] EMERGENCY DEPARTMENT Provider Note   CSN: [id number] Arrival date & time: 04/21/20  7510     History Chief Complaint  Patient presents with  . Shoulder Pain    [name] is a 64 y.o. female with pertinent past medical history of anxiety, depression, ulcerative colitis that presents to the emergency department today for right shoulder pain that started 4 days ago.  Patient denies any trauma to this area.  Patient states that she thinks she might have lifted something like a heavy pot 4 days ago however does not truly remember.  Patient states that it hurts worse when she brushes her hair or does any overhead reaching.  Patient states that pain is mostly in shoulder, does not radiate anywhere.  Denies any numbness and tingling down her arm.  Denies any weakness.  Denies any fevers, chills, nausea, vomiting, neck pain, back pain.  States that she is generally healthy.  No history of arthritis.  Has not take anything for this.  Does not wake her up from her sleep.  No history of diabetes.  No history of immobilization.  HPI     Past Medical History:  Diagnosis Date  . Anxiety   . Depression   . Migraine   . Palpitations   . Pancreatitis   . Ulcerative colitis [facility])     Patient Active Problem List   Diagnosis Date Noted  . Hyperlipidemia 01/31/2020  . Pancolitis ([place]) 09/08/2019  . Elevated lipase   . Rectal bleeding   . Acute pancreatitis 08/11/2019  . Hypokalemia 08/11/2019  . Anxiety with depression 08/11/2019  . Skin cancer 10/13/2018  . Tobacco abuse 04/14/2015  . Insomnia 10/10/2011  . Colitis 02/17/2011  . Depression 10/29/2009  . PALPITATIONS, HX OF 10/29/2009  . Anxiety state 04/11/2008  . Abdominal pain 08/30/2007    Past Surgical History:  Procedure Laterality Date  . APPENDECTOMY Right   . BIOPSY  08/18/2019   Procedure: BIOPSY;  Surgeon: Jackquline Denmark, MD;  Location: [facility];  Service: Endoscopy;;  . COLONOSCOPY N/A  08/18/2019   Procedure: COLONOSCOPY;  Surgeon: Jackquline Denmark, MD;  Location: [facility];  Service: Endoscopy;  Laterality: N/A;  . ESOPHAGOGASTRODUODENOSCOPY N/A 08/18/2019   Procedure: ESOPHAGOGASTRODUODENOSCOPY (EGD);  Surgeon: Jackquline Denmark, MD;  Location: [facility];  Service: Endoscopy;  Laterality: N/A;  . POLYPECTOMY  08/18/2019   Procedure: POLYPECTOMY;  Surgeon: Jackquline Denmark, MD;  Location: [facility];  Service: Endoscopy;;  . TONSILLECTOMY       OB History   No obstetric history on file.     Family History  Problem Relation Age of Onset  . Prostate cancer Father   . Hypertension Mother     Social History   Tobacco Use  . Smoking status: Current Every Day Smoker    Packs/day: 1.00    Types: Cigarettes  . Smokeless tobacco: Never Used  . Tobacco comment: quit date: 04/23/2015. ADE with [employer]  Vaping Use  . Vaping Use: Never used  Substance Use Topics  . Alcohol use: Yes    Alcohol/week: 0.0 standard drinks  . Drug use: No    Home Medications Prior to Admission medications   Medication Sig Start Date End Date Taking? Authorizing Provider  ALPRAZolam Duanne Moron) 0.25 MG tablet TAKE 1 TABLET BY MOUTH TWICE A DAY AS NEEDED FOR ANXIETY 04/06/20   Isaac Bliss, Rayford Halsted, MD  buPROPion (WELLBUTRIN XL) 150 MG 24 hr tablet TAKE 1 TABLET BY MOUTH EVERY DAY 03/26/20  Isaac Bliss, Rayford Halsted, MD  buPROPion (WELLBUTRIN XL) 300 MG 24 hr tablet Take 1 tablet (300 mg total) by mouth daily. 01/27/20   Isaac Bliss, Rayford Halsted, MD  calcium-vitamin D 250-100 MG-UNIT tablet Take 1 tablet by mouth 2 (two) times daily.    [provider]  mesalamine (LIALDA) 1.2 g EC tablet TAKE 2 TABLETS (2.4 G TOTAL) BY MOUTH 2 (TWO) TIMES DAILY. 03/27/20   Noralyn Pick, NP  Multiple Vitamin (MULTIVITAMIN) tablet Take 1 tablet by mouth daily.    [provider]    Allergies    Patient has no known allergies.  Review of Systems   Review of Systems    Constitutional: Negative for diaphoresis, fatigue and fever.  Eyes: Negative for visual disturbance.  Respiratory: Negative for shortness of breath.   Cardiovascular: Negative for chest pain.  Gastrointestinal: Negative for nausea and vomiting.  Musculoskeletal: Positive for arthralgias. Negative for back pain, joint swelling, myalgias, neck pain and neck stiffness.  Skin: Negative for color change, pallor, rash and wound.  Neurological: Negative for syncope, weakness, light-headedness, numbness and headaches.  Psychiatric/Behavioral: Negative for behavioral problems and confusion.    Physical Exam Updated Vital Signs BP 127/66 (BP Location: Right Arm)   Pulse 70   Temp 98.1 F (36.7 C)   Resp 15   Wt 58.5 kg   SpO2 98%   BMI 21.47 kg/m   Physical Exam Constitutional:      General: She is not in acute distress.    Appearance: Normal appearance. She is not ill-appearing, toxic-appearing or diaphoretic.  HENT:     Head: Normocephalic and atraumatic.  Eyes:     Extraocular Movements: Extraocular movements intact.     Pupils: Pupils are equal, round, and reactive to light.  Cardiovascular:     Rate and Rhythm: Normal rate and regular rhythm.     Pulses: Normal pulses.  Pulmonary:     Effort: Pulmonary effort is normal.     Breath sounds: Normal breath sounds.  Musculoskeletal:        General: Normal range of motion.     Right shoulder: Tenderness present. No swelling, deformity, effusion, laceration, bony tenderness or crepitus. Normal range of motion. Normal strength. Normal pulse.     Left shoulder: Normal.       Arms:     Comments: Patient with tenderness to right shoulder, mostly in the anterolateral area.  No tenderness elsewhere.  No overlying skin changes, warmth or erythema.  Patient is able to passively range shoulder in all directions.  Is also able to actively range motion, however most tenderness when patient abducts arm.  Negative liftoff test.  Positive empty  can test.  No acromion tenderness.  Negative apprehension test.  Normal strength to shoulder, elbow, wrist.  Normal sensation throughout.  Radial pulse 2+ and equal.  Normal cap refill.  No cervical, thoracic, lumbar spine tenderness.  Skin:    General: Skin is warm and dry.     Capillary Refill: Capillary refill takes less than 2 seconds.  Neurological:     General: No focal deficit present.     Mental Status: She is alert and oriented to person, place, and time.     Cranial Nerves: No cranial nerve deficit.     Sensory: No sensory deficit.  Psychiatric:        Mood and Affect: Mood normal.        Behavior: Behavior normal.        Thought  Content: Thought content normal.     ED Results / Procedures / Treatments   Labs (all labs ordered are listed, but only abnormal results are displayed) Labs Reviewed - No data to display  EKG None  Radiology No results found.  Procedures Procedures (including critical care time)  Medications Ordered in ED Medications - No data to display  ED Course  I have reviewed the triage vital signs and the nursing notes.  Pertinent labs & imaging results that were available during my care of the patient were reviewed by me and considered in my medical decision making (see chart for details).    MDM Rules/Calculators/A&P                         [name] is a 64 y.o. female with pertinent past medical history of anxiety, depression, ulcerative colitis that presents to the emergency department today for right shoulder pain that started 4 days ago.  Atraumatic, no blood thinners.  Patient is distally neurovascularly intact.  Normal strength and sensation throughout.  With history of pain with overhead pain and positive liftoff test, most likely impingement syndrome.  However patient wants x-rays at this time, will obtain x-ray.  Do not think that this is atypical ACS at this time, no chest pain.  Shoulder pain does not radiate.  Has been present  for 4 days.  Story and physical exam MSK nature.  X-rays are negative, will have patient take NSAIDs for 10 days and follow-up with Ortho.  Stretching exercises provided.  Doubt need for further emergent work up at this time. I explained the diagnosis and have given explicit precautions to return to the ER including for any other new or worsening symptoms. The patient understands and accepts the medical plan as it's been dictated and I have answered their questions. Discharge instructions concerning home care and prescriptions have been given. The patient is STABLE and is discharged to home in good condition.   Final Clinical Impression(s) / ED Diagnoses Final diagnoses:  Acute pain of right shoulder    Rx / DC Orders ED Discharge Orders    None        Alfredia Client, PA-C 04/21/20 1137    Lucrezia Starch, MD 04/23/20 [phone]

## 2020-04-23 ENCOUNTER — Telehealth: Payer: Self-pay | Admitting: Gastroenterology

## 2020-04-23 NOTE — Telephone Encounter (Signed)
Spoke to patient who reports going to the ED on 04/21/20 for a shoulder pain. She was started on Naproxen 375 mg. She only took one dose on Saturday and within 1 hour she started having cramping with diarrhea. She had 8 stools. On Sunday she had 3 bouts of diarrhea with a burning sensation in her stomach.  This morning she reports nausea and continued burning. Dr Lyndel Safe, please advise if patient can take anything else for her symptoms. She states that feels bad and feels the Naproxen has triggered her  UC pancolitis and does not want her symptoms getting out of hand.

## 2020-04-24 ENCOUNTER — Other Ambulatory Visit: Payer: BC Managed Care – PPO

## 2020-04-24 ENCOUNTER — Other Ambulatory Visit: Payer: Self-pay | Admitting: Gastroenterology

## 2020-04-24 DIAGNOSIS — K5100 Ulcerative (chronic) pancolitis without complications: Secondary | ICD-10-CM

## 2020-04-24 MED ORDER — PANTOPRAZOLE SODIUM 40 MG PO TBEC
DELAYED_RELEASE_TABLET | ORAL | 0 refills | Status: DC
Start: 2020-04-24 — End: 2020-06-01

## 2020-04-24 NOTE — Telephone Encounter (Signed)
[name], I have reviewed the chart [name] only took 1 dose of naproxen Having heartburn/nausea/diarrhea.  No hematochezia  Plan: -Lets start Protonix 40 mg p.o. once a day x 2 weeks, half an hour before breakfast. -Lets check stool for GI pathogens, calprotectin -Also asked her about diarrhea. -[name] should continue taking mesalamine for now. -If diarrhea persists over the next few days, and depending upon the above blood tests, would consider blood tests and/or prednisone.  Otherwise hold off  RG

## 2020-04-24 NOTE — Telephone Encounter (Signed)
Pt is requesting a call back from a nurse to discuss being seen sometime soon

## 2020-04-24 NOTE — Telephone Encounter (Signed)
Spoke to patient to inform her of Dr Steve Rattler recommendations. She will go to the [place] office for her stool studies. Medication sent to her pharmacy. Patient stated that her diarrhea has improved,but the consistency is very soft and unformed. All questions answered. Patient voiced understanding.

## 2020-04-26 DIAGNOSIS — H04123 Dry eye syndrome of bilateral lacrimal glands: Secondary | ICD-10-CM | POA: Diagnosis not present

## 2020-04-26 DIAGNOSIS — H0015 Chalazion left lower eyelid: Secondary | ICD-10-CM | POA: Diagnosis not present

## 2020-04-26 DIAGNOSIS — H0012 Chalazion right lower eyelid: Secondary | ICD-10-CM | POA: Diagnosis not present

## 2020-04-28 LAB — GI PROFILE, STOOL, PCR

## 2020-05-01 ENCOUNTER — Telehealth: Payer: Self-pay | Admitting: Gastroenterology

## 2020-05-01 ENCOUNTER — Other Ambulatory Visit: Payer: Self-pay | Admitting: Gastroenterology

## 2020-05-01 MED ORDER — VANCOMYCIN HCL 125 MG PO CAPS: 125.0000 mg | ORAL_CAPSULE | Freq: Four times a day (QID) | ORAL | 0 refills | Status: AC

## 2020-05-01 NOTE — Telephone Encounter (Signed)
Left message for patient to call back  

## 2020-05-01 NOTE — Telephone Encounter (Signed)
Dr Lyndel Safe please review 04/24/20 GI profile. She is positive or C-Diff.

## 2020-05-02 NOTE — Telephone Encounter (Signed)
Spoke to patient to inform her that Dr Lyndel Safe has reviewed her GI pathogen panel and has prescribed medication for C-Diff. She will pick up at the pharmacy and start today along with a probiotic. Patient voiced understanding.

## 2020-05-03 LAB — CALPROTECTIN, FECAL: Calprotectin, Fecal: <16 ug/g (ref 0–120)

## 2020-05-03 NOTE — Telephone Encounter (Signed)
Spoke to patient who reports feeling a little better. She started VAncomycin late Tuesday evening. She also added Align daily. Her stools are a bit more formed.

## 2020-05-03 NOTE — Telephone Encounter (Signed)
[name], please call her and see how she is doing on vancomycin RG

## 2020-05-03 NOTE — Telephone Encounter (Signed)
Thanks RG

## 2020-05-04 NOTE — Telephone Encounter (Signed)
Pt has more questions and request a call back.

## 2020-05-04 NOTE — Telephone Encounter (Signed)
Spoke to patient. All questions answered discussed all labs and treatment plan. She has a follow up with Dr Lyndel Safe early next month.

## 2020-05-14 ENCOUNTER — Other Ambulatory Visit: Payer: Self-pay | Admitting: Internal Medicine

## 2020-05-14 DIAGNOSIS — F411 Generalized anxiety disorder: Secondary | ICD-10-CM

## 2020-05-28 ENCOUNTER — Telehealth: Payer: Self-pay | Admitting: Family Medicine

## 2020-05-28 NOTE — Telephone Encounter (Signed)
Called pt to offer ED follow -up care for Shoulder pain--Left msg for pt to contact office for scheduling if appt desired.  --glh

## 2020-05-29 ENCOUNTER — Ambulatory Visit: Payer: BC Managed Care – PPO | Admitting: Gastroenterology

## 2020-05-31 ENCOUNTER — Telehealth: Payer: Self-pay | Admitting: Gastroenterology

## 2020-05-31 NOTE — Telephone Encounter (Signed)
Patient is really sick from C-diff seeking help

## 2020-05-31 NOTE — Telephone Encounter (Signed)
Spoke to patient. Appointment scheduled to see Dr Carlean Purl. She knows to go to the [place] office.

## 2020-06-01 ENCOUNTER — Ambulatory Visit (INDEPENDENT_AMBULATORY_CARE_PROVIDER_SITE_OTHER): Payer: BC Managed Care – PPO | Admitting: Internal Medicine

## 2020-06-01 VITALS — BP 112/74 | HR 79 | Ht 65.0 in | Wt 134.0 lb

## 2020-06-01 DIAGNOSIS — R1013 Epigastric pain: Secondary | ICD-10-CM

## 2020-06-01 DIAGNOSIS — R140 Abdominal distension (gaseous): Secondary | ICD-10-CM

## 2020-06-01 DIAGNOSIS — K5100 Ulcerative (chronic) pancolitis without complications: Secondary | ICD-10-CM | POA: Diagnosis not present

## 2020-06-01 MED ORDER — IBGARD 90 MG PO CPCR: ORAL_CAPSULE | ORAL | 0 refills | Status: DC

## 2020-06-01 MED ORDER — FAMOTIDINE 40 MG PO TABS: 40.0000 mg | ORAL_TABLET | Freq: Every day | ORAL | 0 refills | Status: DC

## 2020-06-01 NOTE — Patient Instructions (Signed)
We are providing you with samples of IBgard to take as directed.  Please take over the counter Align daily for a month. If you are not better call us back.  Please come back to see Dr Carlean Purl in April 2022. Call in March to make this appointment.  I appreciate the opportunity to care for you. Ronney Lion, [facility]

## 2020-06-01 NOTE — Progress Notes (Signed)
[name] 64 y.o. [birth year] [id number]  Assessment & Plan:   Encounter Diagnoses  Name Primary?  . Dyspepsia Yes  . Gaseous abdominal distention   . 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  Interestingly her calprotectin was low in the face of a C. difficile toxin positive PCR panel.  I do not think we need any other labs she just had those about a month ago.  Symptomatic treatment with famotidine 40 mg daily, take that for a month or so if not better let me know.  Also add align 1 each day for a month and trial of IBgard to twice daily to 3 times daily.  She knows to message me (I have agreed to care for her that was her request) if this fails to work and would plan on a routine follow-up in about 6 months.  Colonoscopy in early 2022 does make sense as previously indicated by Dr. Lyndel Safe.  Depending upon clinical course and calprotectin levels in the future and colonoscopy results consider reducing mesalamine but not at this time   I appreciate the opportunity to care for this patient. CC: Isaac Bliss, Rayford Halsted, MD     Subjective:   Chief Complaint: Bloating nausea gas heartburn  HPI 64 year old white woman with a history of inflammatory bowel disease, and indeterminate colitis presumably, with a story going back to 2009 where she had endoscopic changes raising question of a patchy colitis with biopsy showing a focus of active mucosal inflammation that was chronic. Treated with mesalamine but did not follow-up and did pretty well, subsequent colonoscopy 2012 due to a 15 mm adenoma removed in 2009.  Normal biopsies normal terminal ileum then, a polyp was a benign mucosal polyp.  December 2020 admitted to the hospital and was found to have pancolitis and chronic colitis on biopsies with colonoscopy by Dr.  Lyndel Safe and EGD showing mild peptic duodenitis.  Normal terminal ileum.  Moderate Lee active chronic colitis negative for granulomata and dysplasia.  Biopsies suggestive of UC versus Crohn's.  Treated with prednisone significantly improved placed on mesalamine 2.4 g twice daily.  Seen again in April by Dr. Lyndel Safe patient had requested transfer to him had previously seen Dr. Ardis Hughs.  Was doing well on 2.4 g twice daily mesalamine.  Plan for repeat colonoscopy January 2022.  Went to the emergency department with symptoms of a "pinched nerve" and was treated with an NSAID 1 dose of naproxen, she was having heartburn nausea and diarrhea quite severely right after that she called in and Dr. Lyndel Safe had stool checked for GI pathogens calprotectin and put her on a couple of weeks of pantoprazole.  CBC CMET unremarkable fecal calprotectin was less than 16.  She did have a positive C. difficile toxin on the GI pathogen panel and was treated with a round of vancomycin.  Her severe symptoms resolved.   She called and asked for follow-up today and is asking to return to the [facility] location for her follow-up care on a routine basis because of the difficulty in going to [facility] and accessing care there.  She lives closer to this office.  She is reporting bloating gaseousness nausea to some degree and a lot of heartburn symptoms.  Mild to moderate upper abdominal discomfort at times.  One relatively formed stool a day and  some intermittent explosive gas.  Just feels sort of blah and unwell.  No pain.  No bleeding.   No Known Allergies Current Meds  Medication Sig  . ALPRAZolam (XANAX) 0.25 MG tablet TAKE 1 TABLET BY MOUTH TWICE A DAY AS NEEDED FOR ANXIETY  . calcium-vitamin D 250-100 MG-UNIT tablet Take 1 tablet by mouth 2 (two) times daily.  . mesalamine (LIALDA) 1.2 g EC tablet TAKE 2 TABLETS (2.4 G TOTAL) BY MOUTH 2 (TWO) TIMES DAILY.  . Multiple Vitamin (MULTIVITAMIN) tablet Take 1 tablet by mouth daily.   Past  Medical History:  Diagnosis Date  . Anxiety   . Clostridioides difficile infection   . Depression   . Migraine   . Palpitations   . Pancreatitis   . Ulcerative colitis [facility])    Past Surgical History:  Procedure Laterality Date  . APPENDECTOMY Right   . BIOPSY  08/18/2019   Procedure: BIOPSY;  Surgeon: Jackquline Denmark, MD;  Location: [facility];  Service: Endoscopy;;  . COLONOSCOPY N/A 08/18/2019   Procedure: COLONOSCOPY;  Surgeon: Jackquline Denmark, MD;  Location: [facility];  Service: Endoscopy;  Laterality: N/A;  . ESOPHAGOGASTRODUODENOSCOPY N/A 08/18/2019   Procedure: ESOPHAGOGASTRODUODENOSCOPY (EGD);  Surgeon: Jackquline Denmark, MD;  Location: [facility];  Service: Endoscopy;  Laterality: N/A;  . POLYPECTOMY  08/18/2019   Procedure: POLYPECTOMY;  Surgeon: Jackquline Denmark, MD;  Location: [facility];  Service: Endoscopy;;  . TONSILLECTOMY     Social History   Social History Narrative   Divorced   3 children         family history includes Hypertension in her mother; Liver disease in her brother and brother; Prostate cancer in her father.   Review of Systems As per HPI  Objective:   Physical Exam BP 112/74   Pulse 79   Ht 5' 5"  (1.651 m)   Wt 134 lb (60.8 kg)   SpO2 98%   BMI 22.30 kg/m  Well-developed well-nourished thin white woman no acute distress Eyes are anicteric The abdomen is mildly protuberant soft and nontender bowel sounds are somewhat increased

## 2020-06-05 ENCOUNTER — Ambulatory Visit: Payer: BC Managed Care – PPO | Admitting: Internal Medicine

## 2020-06-17 ENCOUNTER — Other Ambulatory Visit: Payer: Self-pay | Admitting: Internal Medicine

## 2020-06-17 DIAGNOSIS — F411 Generalized anxiety disorder: Secondary | ICD-10-CM

## 2020-07-11 DIAGNOSIS — Z20822 Contact with and (suspected) exposure to covid-19: Secondary | ICD-10-CM | POA: Diagnosis not present

## 2020-07-23 ENCOUNTER — Ambulatory Visit: Payer: BC Managed Care – PPO | Admitting: Gastroenterology

## 2020-08-10 ENCOUNTER — Other Ambulatory Visit: Payer: Self-pay | Admitting: Internal Medicine

## 2020-08-10 DIAGNOSIS — F411 Generalized anxiety disorder: Secondary | ICD-10-CM

## 2020-08-11 ENCOUNTER — Other Ambulatory Visit: Payer: Self-pay | Admitting: Internal Medicine

## 2020-08-16 NOTE — Telephone Encounter (Signed)
Patient is calling back to check to the status of medication refill, please advise.CB is [phone]

## 2020-08-21 ENCOUNTER — Other Ambulatory Visit: Payer: Self-pay | Admitting: *Deleted

## 2020-08-21 DIAGNOSIS — F320 Major depressive disorder, single episode, mild: Secondary | ICD-10-CM

## 2020-08-21 MED ORDER — BUPROPION HCL ER (XL) 300 MG PO TB24: 300.0000 mg | ORAL_TABLET | Freq: Every day | ORAL | 1 refills | Status: DC

## 2020-08-27 ENCOUNTER — Other Ambulatory Visit: Payer: Self-pay | Admitting: Internal Medicine

## 2020-09-12 ENCOUNTER — Other Ambulatory Visit: Payer: Self-pay | Admitting: Family Medicine

## 2020-09-12 DIAGNOSIS — F411 Generalized anxiety disorder: Secondary | ICD-10-CM

## 2020-09-14 IMAGING — US US ABDOMEN LIMITED
1 series · 14 of 25 positions shown · non-contrast
Comparison: Same-day CT abdomen pelvis.

CLINICAL DATA: Abdominal pain for 5 days

EXAM:
ULTRASOUND ABDOMEN LIMITED RIGHT UPPER QUADRANT

[Series 1: us abdomen limited · 14 of 52 slices shown]
[im 1/52]
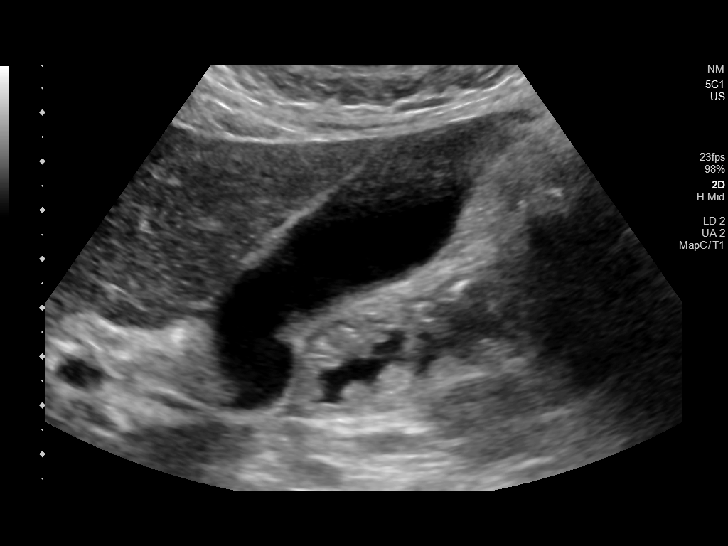
[im 5/52]
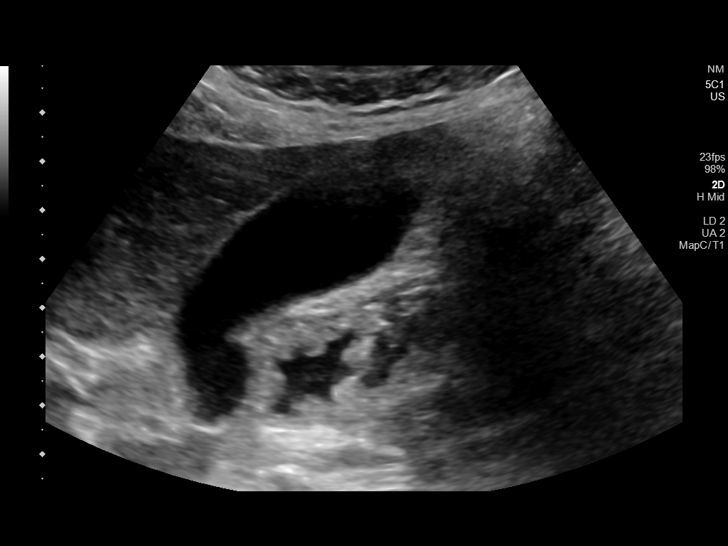
[im 9/52]
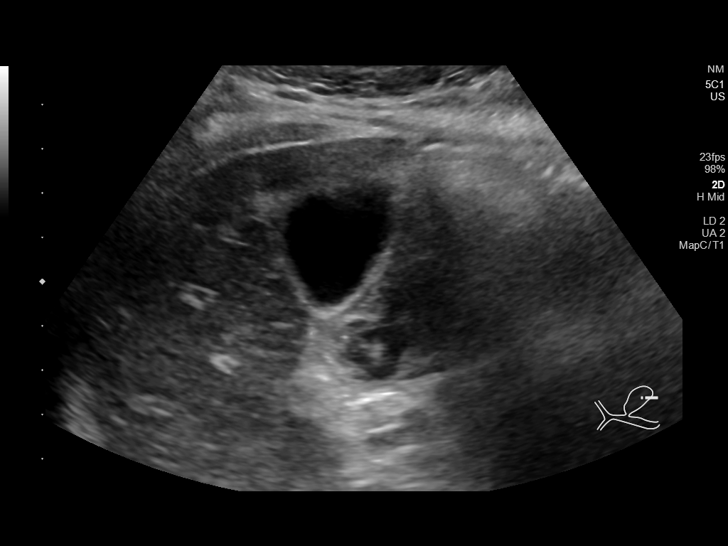
[im 13/52]
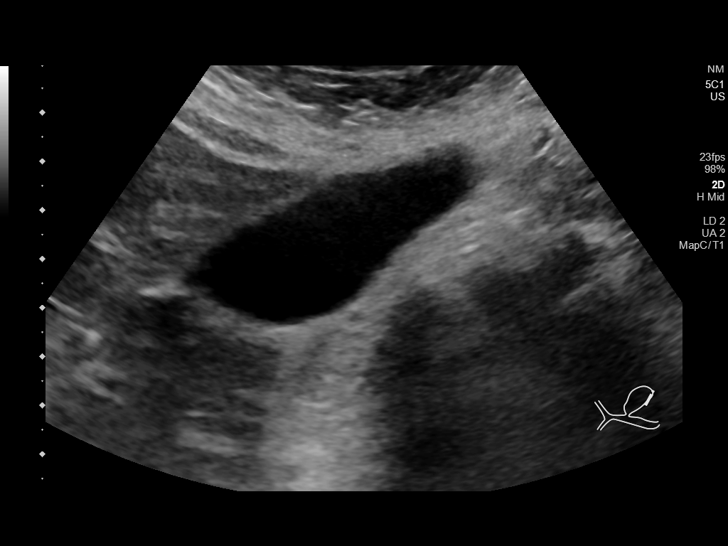
[im 18/52]
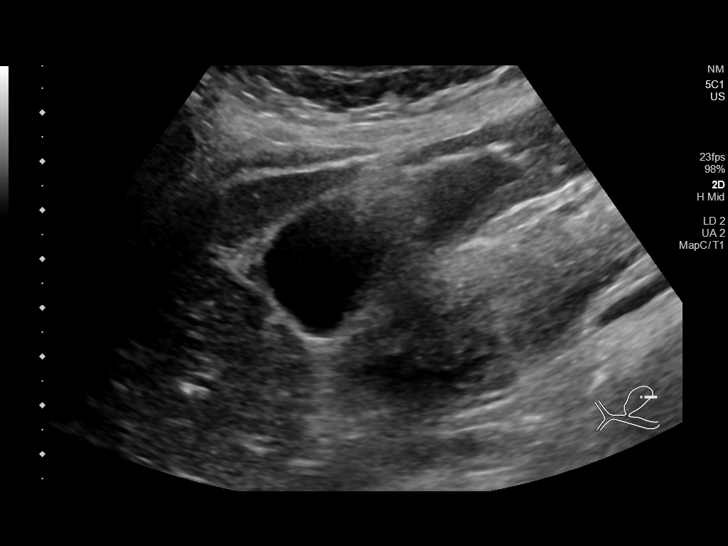
[im 20/52]
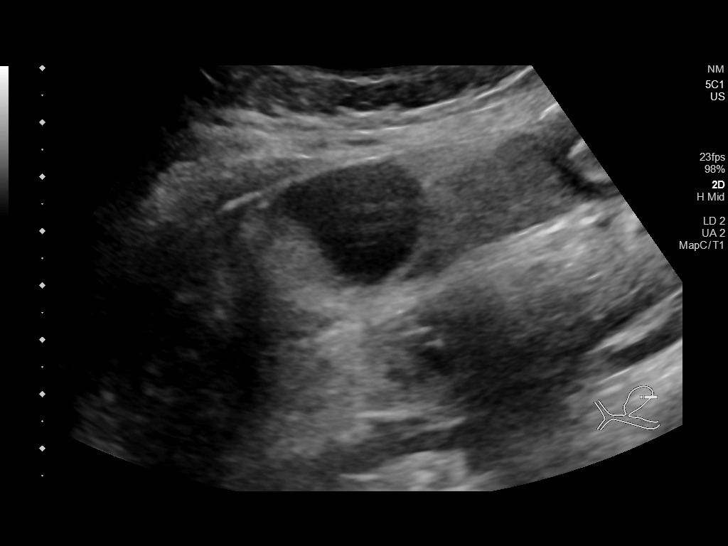
[im 24/52]
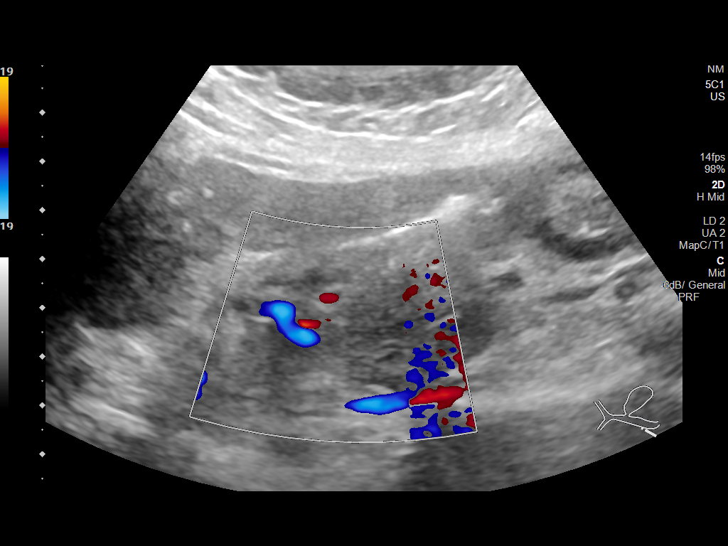
[im 28/52]
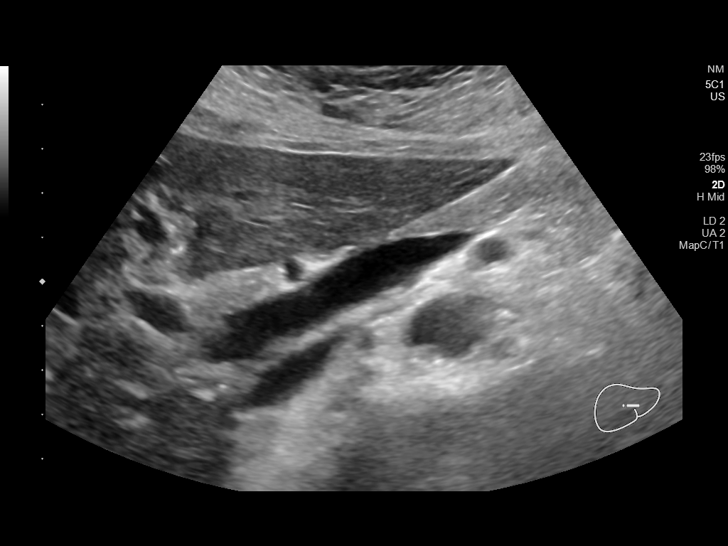
[im 32/52]
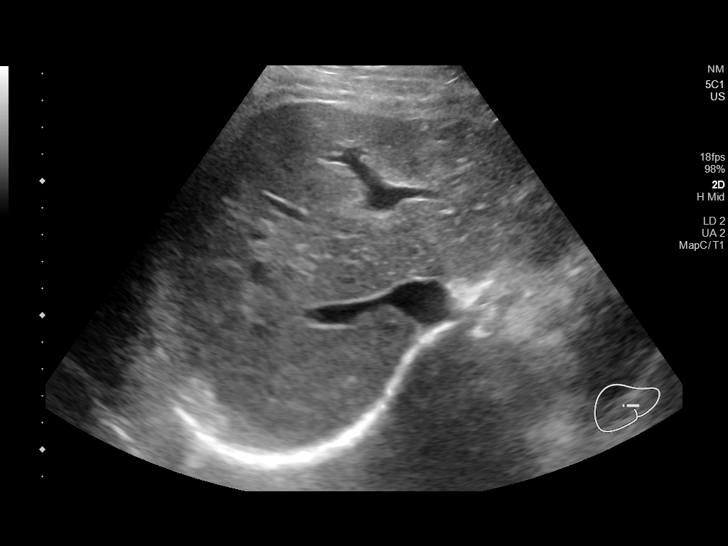
[im 35/52]
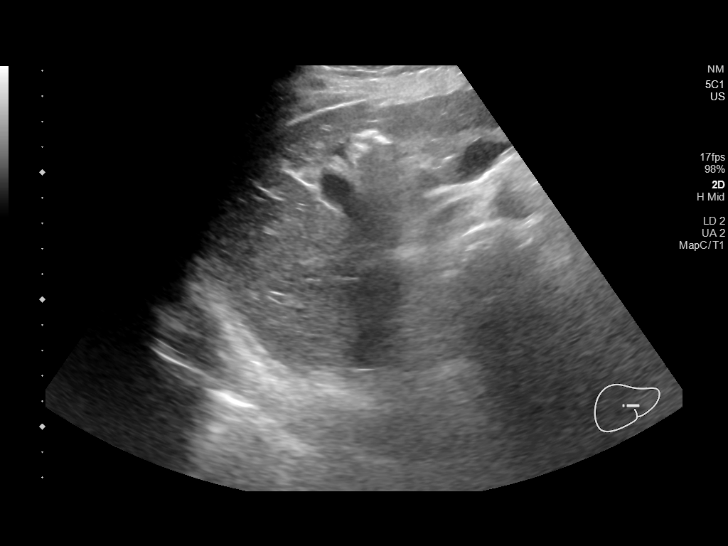
[im 39/52]
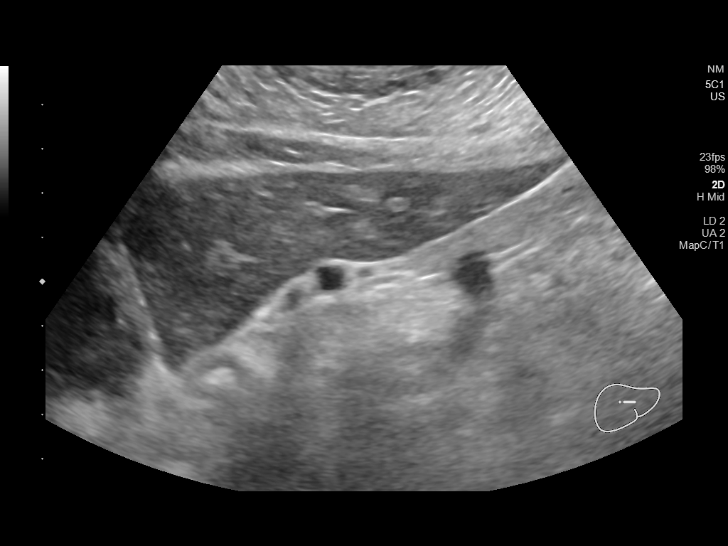
[im 43/52]
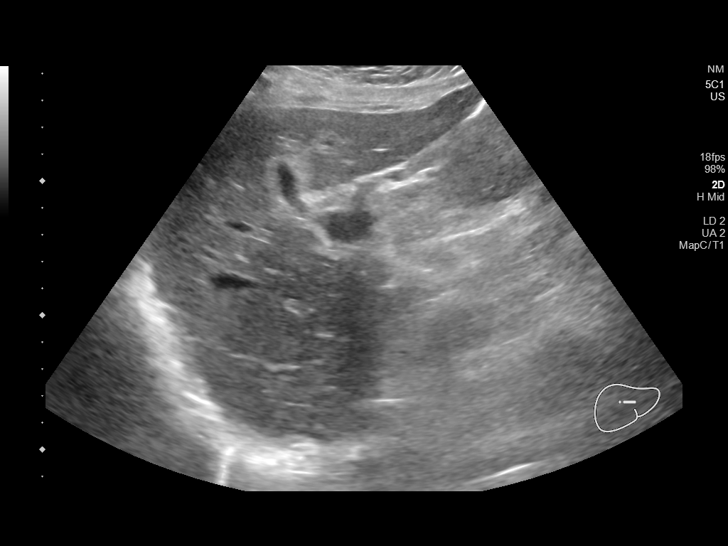
[im 47/52]
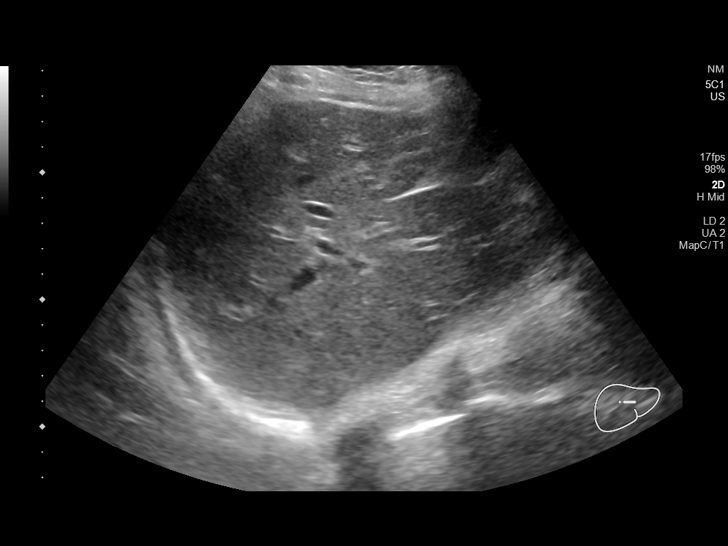
[im 52/52]
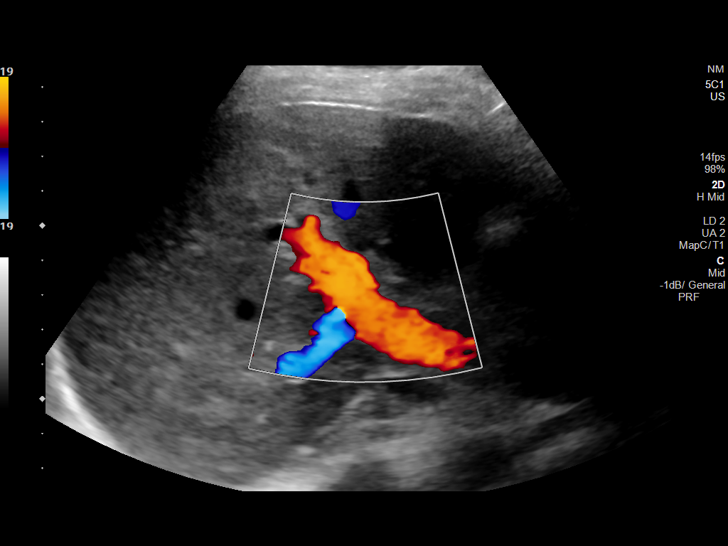

[14 of 25 positions shown; findings below may reference images not displayed]

FINDINGS: Gallbladder:

No gallstones or wall thickening visualized. No sonographic Murphy
sign noted by sonographer.

Common bile duct:

Diameter: Distal common bile duct is poorly visualized. Proximal
bile duct measures 3.4 mm, nondilated.

Liver:

No focal lesion identified. Within normal limits in parenchymal
echogenicity. Portal vein is patent on color Doppler imaging with
normal direction of blood flow towards the liver.

Other: None.
IMPRESSION: Nonvisualization of the distal common bile duct.

No visible cholelithiasis, choledocholithiasis, biliary dilatation
or features of acute cholecystitis.

## 2020-10-15 ENCOUNTER — Other Ambulatory Visit: Payer: Self-pay | Admitting: Internal Medicine

## 2020-10-15 DIAGNOSIS — F411 Generalized anxiety disorder: Secondary | ICD-10-CM

## 2020-11-17 ENCOUNTER — Other Ambulatory Visit: Payer: Self-pay | Admitting: Internal Medicine

## 2020-11-17 DIAGNOSIS — F411 Generalized anxiety disorder: Secondary | ICD-10-CM

## 2020-12-17 ENCOUNTER — Other Ambulatory Visit: Payer: Self-pay | Admitting: Nurse Practitioner

## 2020-12-20 ENCOUNTER — Other Ambulatory Visit: Payer: Self-pay | Admitting: Internal Medicine

## 2020-12-20 DIAGNOSIS — F411 Generalized anxiety disorder: Secondary | ICD-10-CM

## 2020-12-25 NOTE — Telephone Encounter (Signed)
The patient is needing a refill on  ALPRAZolam (XANAX) 0.25 MG tablet  [id number] [place], [place] - [address] Phone:  [phone] Fax:  [phone]

## 2021-01-09 ENCOUNTER — Telehealth (INDEPENDENT_AMBULATORY_CARE_PROVIDER_SITE_OTHER): Payer: BC Managed Care – PPO | Admitting: Internal Medicine

## 2021-01-09 DIAGNOSIS — K1379 Other lesions of oral mucosa: Secondary | ICD-10-CM

## 2021-01-09 MED ORDER — LIDOCAINE VISCOUS HCL 2 % MT SOLN: 5.0000 mL | Freq: Three times a day (TID) | OROMUCOSAL | 0 refills | Status: AC

## 2021-01-09 NOTE — Progress Notes (Signed)
Virtual Visit via Video Note  I connected with [name] on 01/09/21 at  3:30 PM EDT by a video enabled telemedicine application and verified that I am speaking with the correct person using two identifiers.  Location patient: home Location provider: work office Persons participating in the virtual visit: patient, provider  I discussed the limitations of evaluation and management by telemedicine and the availability of in person appointments. The patient expressed understanding and agreed to proceed.   HPI: For the past 3 days she has had a mouth ulcer that is causing her pain and difficulty eating.  She will occasionally get these.  She states "they are like the typical canker sore".  She does have a history of ulcerative colitis followed by GI on mesalamine.  She denies any URI symptoms.   ROS: Constitutional: Denies fever, chills, diaphoresis, appetite change and fatigue.  HEENT: Denies photophobia, eye pain, redness, hearing loss, ear pain, congestion, sore throat, rhinorrhea, sneezing, mouth sores, trouble swallowing, neck pain, neck stiffness and tinnitus.   Respiratory: Denies SOB, DOE, cough, chest tightness,  and wheezing.   Cardiovascular: Denies chest pain, palpitations and leg swelling.  Gastrointestinal: Denies nausea, vomiting, abdominal pain, diarrhea, constipation, blood in stool and abdominal distention.  Genitourinary: Denies dysuria, urgency, frequency, hematuria, flank pain and difficulty urinating.  Endocrine: Denies: hot or cold intolerance, sweats, changes in hair or nails, polyuria, polydipsia. Musculoskeletal: Denies myalgias, back pain, joint swelling, arthralgias and gait problem.  Skin: Denies pallor, rash and wound.  Neurological: Denies dizziness, seizures, syncope, weakness, light-headedness, numbness and headaches.  Hematological: Denies adenopathy. Easy bruising, personal or family bleeding history  Psychiatric/Behavioral: Denies suicidal  ideation, mood changes, confusion, nervousness, sleep disturbance and agitation   Past Medical History:  Diagnosis Date  . Anxiety   . Clostridioides difficile infection   . Depression   . Migraine   . Palpitations   . Pancreatitis   . Ulcerative colitis [facility])     Past Surgical History:  Procedure Laterality Date  . APPENDECTOMY Right   . BIOPSY  08/18/2019   Procedure: BIOPSY;  Surgeon: Jackquline Denmark, MD;  Location: [facility];  Service: Endoscopy;;  . COLONOSCOPY N/A 08/18/2019   Procedure: COLONOSCOPY;  Surgeon: Jackquline Denmark, MD;  Location: [facility];  Service: Endoscopy;  Laterality: N/A;  . ESOPHAGOGASTRODUODENOSCOPY N/A 08/18/2019   Procedure: ESOPHAGOGASTRODUODENOSCOPY (EGD);  Surgeon: Jackquline Denmark, MD;  Location: [facility];  Service: Endoscopy;  Laterality: N/A;  . POLYPECTOMY  08/18/2019   Procedure: POLYPECTOMY;  Surgeon: Jackquline Denmark, MD;  Location: [facility];  Service: Endoscopy;;  . TONSILLECTOMY      Family History  Problem Relation Age of Onset  . Prostate cancer Father   . Hypertension Mother   . Liver disease Brother        alcohol related  . Liver disease Brother        alcohol related  . Colon cancer Neg Hx   . Esophageal cancer Neg Hx   . Stomach cancer Neg Hx   . Pancreatic cancer Neg Hx     SOCIAL HX:   reports that she has been smoking cigarettes. She has been smoking about 0.50 packs per day. She has never used smokeless tobacco. She reports current alcohol use. She reports current drug use. Drug: Marijuana.   Current Outpatient Medications:  .  ALPRAZolam (XANAX) 0.25 MG tablet, TAKE 1 TABLET BY MOUTH TWICE A DAY AS NEEDED FOR ANXIETY, Disp: 30 tablet, Rfl: 0 .  buPROPion [facility]  XL) 300 MG 24 hr tablet, Take 1 tablet (300 mg total) by mouth daily., Disp: 90 tablet, Rfl: 1 .  calcium-vitamin D 250-100 MG-UNIT tablet, Take 1 tablet by mouth 2 (two) times daily., Disp: , Rfl:  .  famotidine (PEPCID) 40 MG tablet, TAKE 1 TABLET  BY MOUTH EVERY DAY, Disp: 90 tablet, Rfl: 0 .  magic mouthwash (lidocaine, diphenhydrAMINE, alum & mag hydroxide) suspension, Swish and spit 5 mLs 3 (three) times daily for 10 days., Disp: 150 mL, Rfl: 0 .  mesalamine (LIALDA) 1.2 g EC tablet, TAKE 2 TABLETS (2.4 G TOTAL) BY MOUTH 2 (TWO) TIMES DAILY., Disp: 360 tablet, Rfl: 1 .  Multiple Vitamin (MULTIVITAMIN) tablet, Take 1 tablet by mouth daily., Disp: , Rfl:  .  Peppermint Oil (IBGARD) 90 MG CPCR, Take as directed, Disp: 16 capsule, Rfl: 0  EXAM:   VITALS per patient if applicable: None reported  GENERAL: alert, oriented, appears well and in no acute distress  HEENT: atraumatic, conjunttiva clear, no obvious abnormalities on inspection of external nose and ears  NECK: normal movements of the head and neck  LUNGS: on inspection no signs of respiratory distress, breathing rate appears normal, no obvious gross increased work of breathing, gasping or wheezing  CV: no obvious cyanosis  MS: moves all visible extremities without noticeable abnormality  PSYCH/NEURO: pleasant and cooperative, no obvious depression or anxiety, speech and thought processing grossly intact  ASSESSMENT AND PLAN:   Mouth sore  - Plan: magic mouthwash (lidocaine, diphenhydrAMINE, alum & mag hydroxide) suspension -She will try Magic mouthwash with lidocaine to swish and spit 3 times a day for the next 10 days. -She knows to follow-up with me if no improvement.     I discussed the assessment and treatment plan with the patient. The patient was provided an opportunity to ask questions and all were answered. The patient agreed with the plan and demonstrated an understanding of the instructions.   The patient was advised to call back or seek an in-person evaluation if the symptoms worsen or if the condition fails to improve as anticipated.    Lelon Frohlich, MD  [facility]

## 2021-01-28 ENCOUNTER — Other Ambulatory Visit: Payer: Self-pay | Admitting: Internal Medicine

## 2021-01-28 DIAGNOSIS — F411 Generalized anxiety disorder: Secondary | ICD-10-CM

## 2021-02-28 ENCOUNTER — Other Ambulatory Visit: Payer: Self-pay | Admitting: Internal Medicine

## 2021-02-28 DIAGNOSIS — F411 Generalized anxiety disorder: Secondary | ICD-10-CM

## 2021-03-22 DIAGNOSIS — Z20822 Contact with and (suspected) exposure to covid-19: Secondary | ICD-10-CM | POA: Diagnosis not present

## 2021-03-22 DIAGNOSIS — J069 Acute upper respiratory infection, unspecified: Secondary | ICD-10-CM | POA: Diagnosis not present

## 2021-04-02 ENCOUNTER — Other Ambulatory Visit: Payer: Self-pay | Admitting: Internal Medicine

## 2021-04-02 DIAGNOSIS — F411 Generalized anxiety disorder: Secondary | ICD-10-CM

## 2021-04-03 ENCOUNTER — Encounter: Payer: Self-pay | Admitting: Physician Assistant

## 2021-04-03 ENCOUNTER — Telehealth: Payer: BC Managed Care – PPO | Admitting: Physician Assistant

## 2021-04-03 DIAGNOSIS — T300 Burn of unspecified body region, unspecified degree: Secondary | ICD-10-CM

## 2021-04-03 MED ORDER — SILVER SULFADIAZINE 1 % EX CREA: 1.0000 "application " | TOPICAL_CREAM | Freq: Every day | CUTANEOUS | 0 refills | Status: DC
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
## 2021-04-03 NOTE — Patient Instructions (Addendum)
[name], thank you for joining Mar Daring, PA-C for today's virtual visit.  While this provider is not your primary care provider (PCP), if your PCP is located in our provider database this encounter information will be shared with them immediately following your visit.  Consent: (Patient) [name] provided verbal consent for this virtual visit at the beginning of the encounter.  Current Medications:  Current Outpatient Medications:    silver sulfADIAZINE (SILVADENE) 1 % cream, Apply 1 application topically daily., Disp: 50 g, Rfl: 0   ALPRAZolam (XANAX) 0.25 MG tablet, TAKE 1 TABLET BY MOUTH TWICE A DAY AS NEEDED FOR ANXIETY, Disp: 30 tablet, Rfl: 0   buPROPion (WELLBUTRIN XL) 300 MG 24 hr tablet, Take 1 tablet (300 mg total) by mouth daily., Disp: 90 tablet, Rfl: 1   calcium-vitamin D 250-100 MG-UNIT tablet, Take 1 tablet by mouth 2 (two) times daily., Disp: , Rfl:    famotidine (PEPCID) 40 MG tablet, TAKE 1 TABLET BY MOUTH EVERY DAY, Disp: 90 tablet, Rfl: 0   mesalamine (LIALDA) 1.2 g EC tablet, TAKE 2 TABLETS (2.4 G TOTAL) BY MOUTH 2 (TWO) TIMES DAILY., Disp: 360 tablet, Rfl: 1   Multiple Vitamin (MULTIVITAMIN) tablet, Take 1 tablet by mouth daily., Disp: , Rfl:    Peppermint Oil (IBGARD) 90 MG CPCR, Take as directed, Disp: 16 capsule, Rfl: 0   Medications ordered in this encounter:  Meds ordered this encounter  Medications   silver sulfADIAZINE (SILVADENE) 1 % cream    Sig: Apply 1 application topically daily.    Dispense:  50 g    Refill:  0    Order Specific Question:   Supervising Provider    Answer:   Sabra Heck, [place]     *If you need refills on other medications prior to your next appointment, please contact your pharmacy*  Follow-Up: Call back or seek an in-person evaluation if the symptoms worsen or if the condition fails to improve as anticipated.  Other Instructions Lidocaine cream OTC mix with silvadene cream and apply topically to affected  area twice daily   If you have been instructed to have an in-person evaluation today at a local Urgent Care facility, please use the link below. It will take you to a list of all of our available [facility], including address, phone number and hours of operation. Please do not delay care.  [facility]  If you or a family member do not have a primary care provider, use the link below to schedule a visit and establish care. When you choose a [facility] primary care physician or advanced practice provider, you gain a long-term partner in health. Find a Primary Care Provider  Learn more about 's in-office and virtual care options: [facility]  Burn Care, Adult A burn is an injury to the skin or the tissues under the skin. There are three types of burns: First degree. These burns may cause the skin to be red and a bit swollen. Second degree. These burns are very painful and cause the skin to be very red. The skin may also swell, leak fluid, look shiny, and start to have blisters. Third degree. These burns cause lasting damage. They turn the skin white or black and make it look charred, dry, and leathery. Treatment for your burn will depend on the type of burn you have. Taking good care of your burn can help to prevent pain and  infection. It can also help theburn heal quickly. How to care for a first-degree burn Right after a burn: Rinse or soak the burn under cool water for 5 minutes or more. Do not put ice on your burn. That can cause more damage. Put a cool, clean, wet cloth on your burn. Put lotion or gel with aloe vera on your burn. Caring for the burn Clean and care for your burn. Your doctor may tell you: To clean the burn using soap and water. To pat the burn dry using a clean cloth. Do not rub or scrub the burn. To put lotion or gel with aloe vera on your burn. How to care for a second-degree burn Right after a burn: Rinse or soak  the burn under cool water. Do this for 5 to 10 minutes. Do not put ice on your burn. This can cause more damage. Remove any jewelry near the burned area. Cover the burn with a clean cloth. Caring for the burn Raise (elevate) the burned area above the level of your heart while sitting or lying down. Clean and care for your burn. Your doctor may tell you: To clean or rinse your burn. To put a cream or ointment on the burn. To place a germ-free (sterile) dressing over the burn. A dressing is a material that is placed on a burn to help it heal. How to care for a third-degree burn Right after a burn: Cover the burn with a clean, dry cloth. Seek treatment right away if you have this kind of burn. You may: Need to stay in the hospital. Have surgery to remove burned tissue. Have surgery to put new skin on the burned area. Be given fluids through an IV tube. Caring for the burn Clean and care for your burn. Your doctor may tell you: To clean or rinse your burn. To put a cream or ointment on the burn. To put a sterile dressing in the burn. This is called packing. To place a sterile dressing over the burn. Other things to do Raise the burned area above the level of your heart while sitting or lying down. Wear splints or immobilizers if told by your doctor. Rest as told by your doctor. Do not do sports or other activities until your doctor approves. How to prevent infection when caring for a burn  Take these steps to prevent infection: Wash your hands with soap and water for at least 20 seconds before and after caring for your burn. If you cannot use soap and water, use hand sanitizer. Wear clean or sterile gloves as told by your doctor. Do not put butter, oil, toothpaste, or other home remedies on the burn. Do not scratch or pick at the burn. Do not break any blisters. Do not peel the skin. Do not rub your burn, even when you are cleaning it. Check your burn every day for these signs of  infection: More redness, swelling, or pain. Warmth. Pus or a bad smell. Red streaks around the burn. Follow these instructions at home Medicines Take over-the-counter and prescription medicines only as told by your doctor. If you were prescribed an antibiotic medicine, use it as told by your doctor. Do not stop using the antibiotic even if your condition gets better. Your doctor may ask you to take medicine for pain before you change your dressing. General instructions  Protect your burn from the sun. Drink enough fluid to keep your pee (urine) pale yellow. Do not use any products that contain  nicotine or tobacco, such as cigarettes, e-cigarettes, and chewing tobacco. These can delay healing. If you need help quitting, ask your doctor. Keep all follow-up visits as told by your doctor. This is important.  Contact a doctor if: Your condition does not get better. Your condition gets worse. You have a fever or chills. Your burn feels warm to the touch. You have more redness, swelling, or pain on your burn. Your burn looks different or starts to have black or red spots on it. Your pain does not get better with medicine. Get help right away if: You have more fluid, blood, or pus coming from your burn. You have red streaks near the burn. You have very bad pain. Summary There are three types of burns. They are first degree, second degree, and third degree. Of these, a third-degree burn is most serious. This must be treated right away. Treatment for your burn will depend on the type of burn you have. Do not put butter, oil, toothpaste, or other home remedies on the burn. These things can damage your skin. Follow instructions from your doctor about how to clean and take care of your burn. This information is not intended to replace advice given to you by your health care provider. Make sure you discuss any questions you have with your healthcare provider. Document Revised: 09/30/2019  Document Reviewed: 05/31/2019 Elsevier Patient Education  [place].

## 2021-04-23 ENCOUNTER — Other Ambulatory Visit: Payer: Self-pay | Admitting: Internal Medicine

## 2021-04-23 DIAGNOSIS — F411 Generalized anxiety disorder: Secondary | ICD-10-CM

## 2021-05-14 ENCOUNTER — Telehealth: Payer: Self-pay | Admitting: Gastroenterology

## 2021-05-14 NOTE — Telephone Encounter (Signed)
Pt has been experiencing nausea, adb pain, and diarrhea for a couple of months. Pt have worsened the past few days. She does not report any bleeding. Dr. Lyndel Safe does not have anything until November. Pt is requesting something sooner than that. Pls call her.

## 2021-05-15 NOTE — Telephone Encounter (Signed)
Pt was scheduled for an OV with Alonza Bogus PA on Oct 5th @ 1:30   Pt made aware

## 2021-05-26 IMAGING — CR DG SHOULDER 2+V*R*
3 series · 3 of 3 positions shown · non-contrast
Comparison: None.

CLINICAL DATA: Right shoulder and upper arm pain x 4 days; no
injury; pt states she gets pain when she raises shoulder to brush
her hair or when she lifts anything

EXAM:
RIGHT SHOULDER - 2+ VIEW

[w shoulder grashey right]
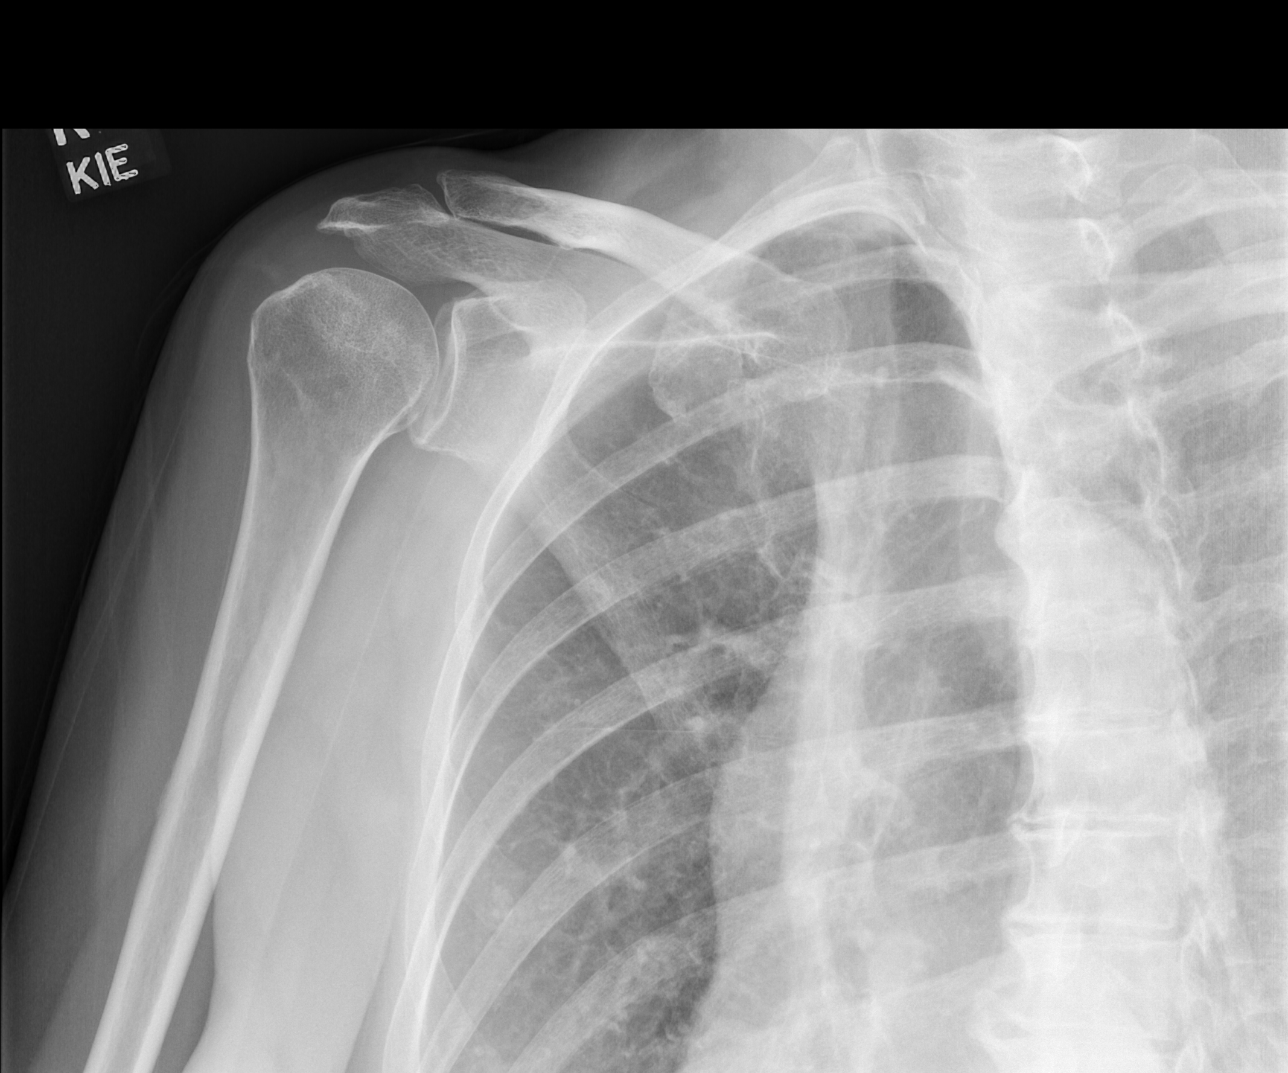

[w shoulder y view right]
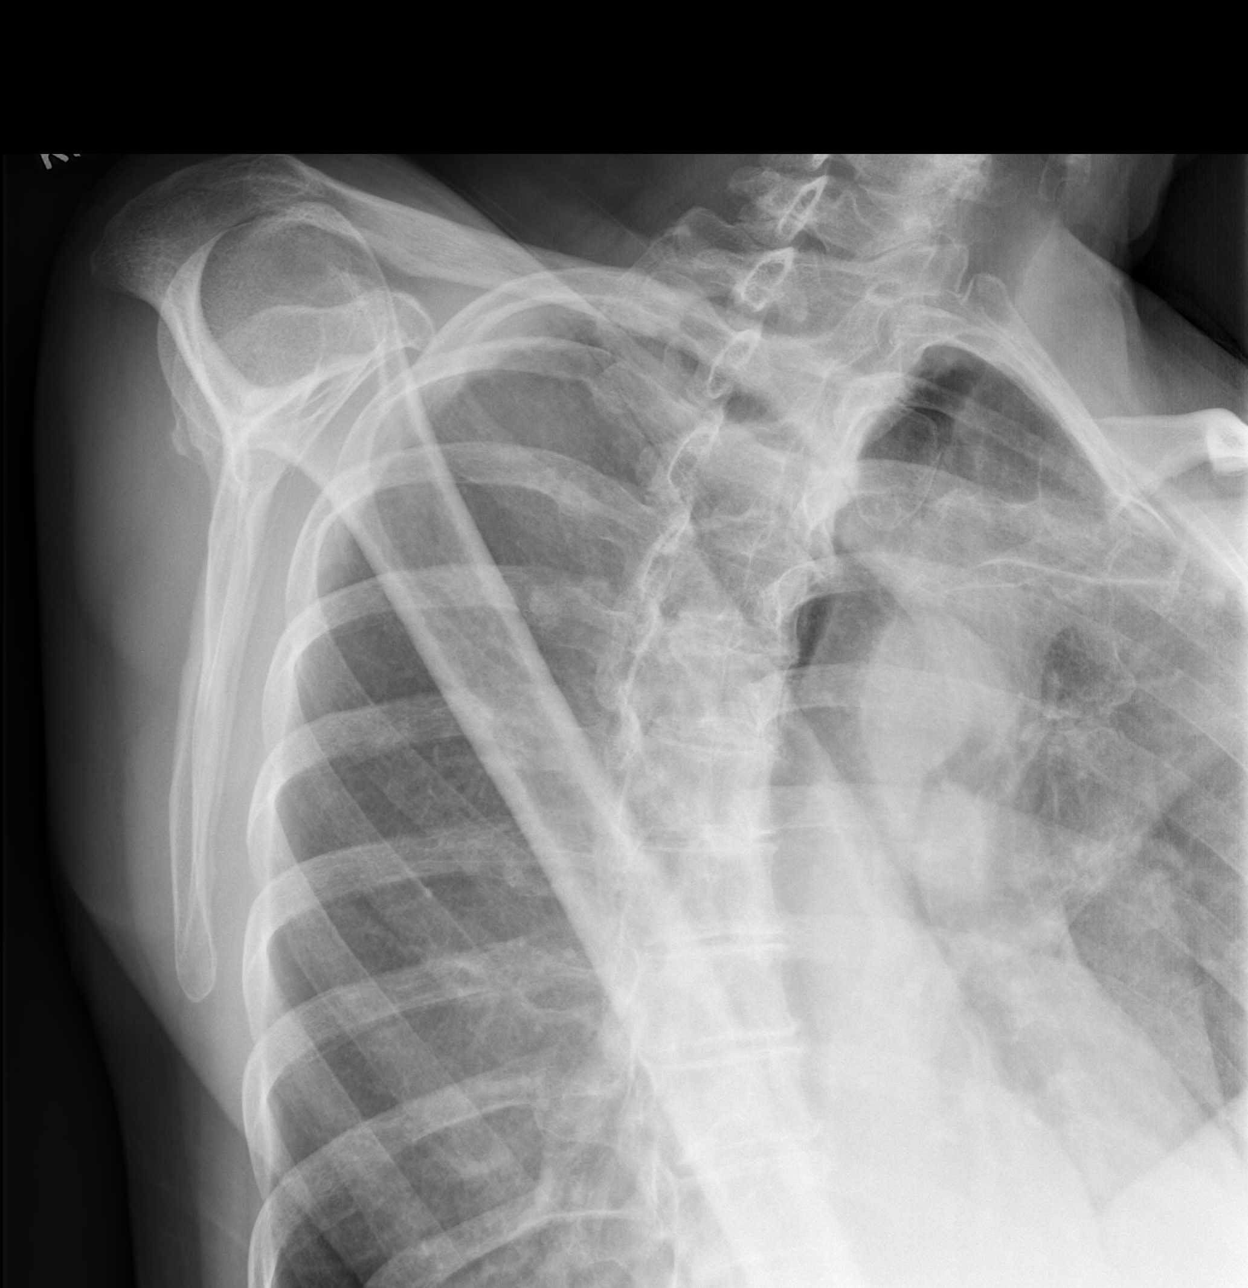

[x shoulder axillary right]
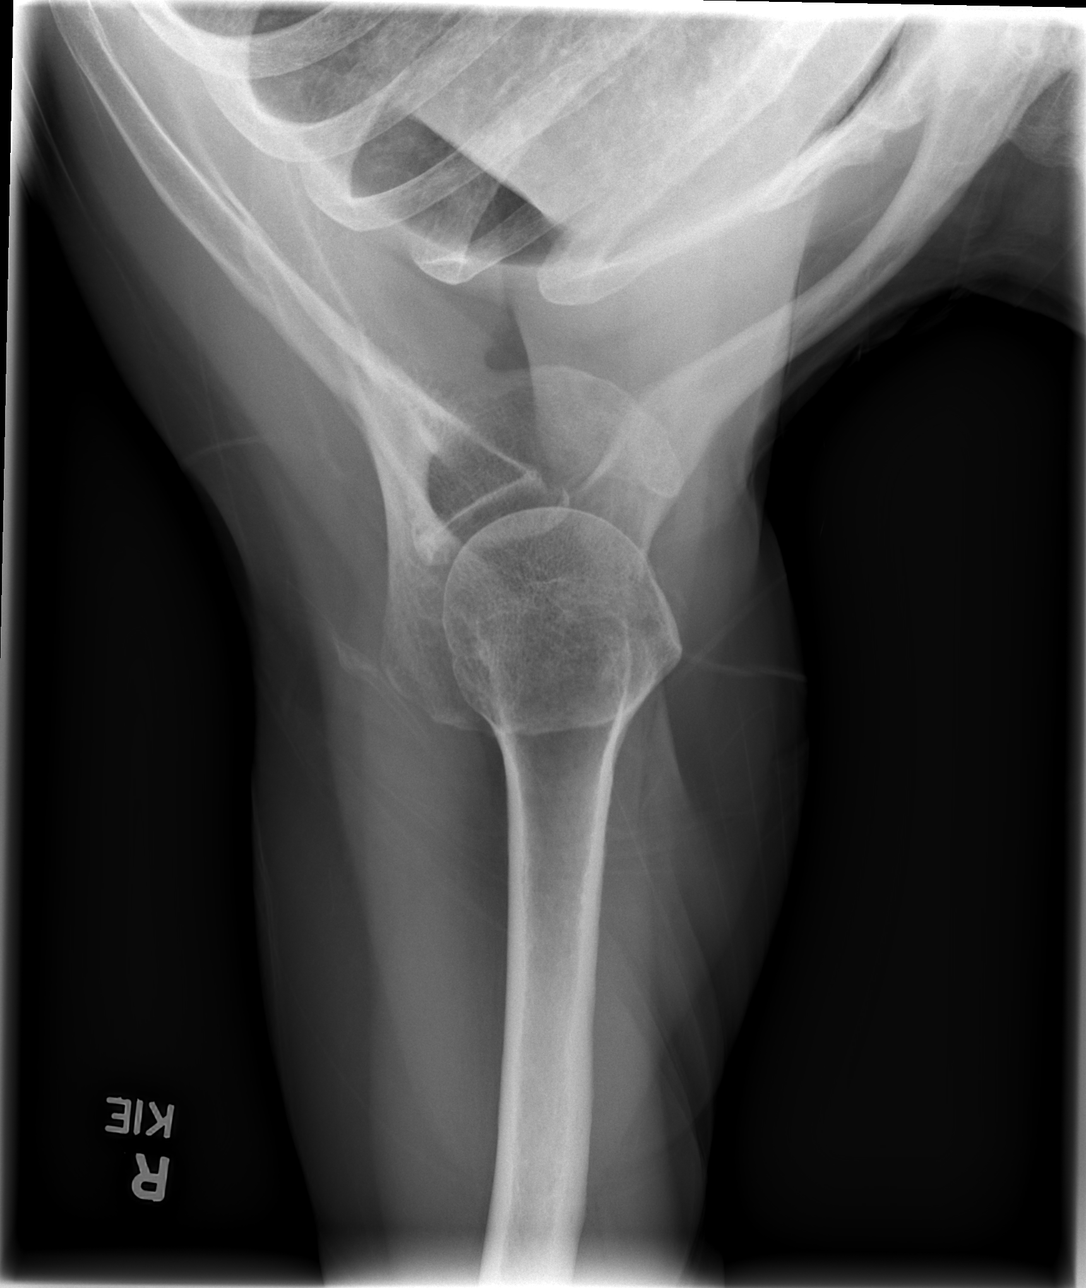

[3 of 3 positions shown; findings below may reference images not displayed]

FINDINGS: There is no evidence of fracture or dislocation. Mild degenerative
changes at the AC joint. No focal bone lesion. Soft tissues are
unremarkable.
IMPRESSION: No acute finding.

## 2021-05-29 ENCOUNTER — Ambulatory Visit: Payer: BC Managed Care – PPO | Admitting: Gastroenterology

## 2021-06-03 ENCOUNTER — Other Ambulatory Visit: Payer: Self-pay | Admitting: Internal Medicine

## 2021-06-03 DIAGNOSIS — F411 Generalized anxiety disorder: Secondary | ICD-10-CM

## 2021-06-16 ENCOUNTER — Other Ambulatory Visit: Payer: Self-pay | Admitting: Internal Medicine

## 2021-06-16 DIAGNOSIS — F320 Major depressive disorder, single episode, mild: Secondary | ICD-10-CM

## 2021-07-03 ENCOUNTER — Other Ambulatory Visit: Payer: Self-pay | Admitting: Internal Medicine

## 2021-07-03 ENCOUNTER — Telehealth: Payer: Self-pay | Admitting: Gastroenterology

## 2021-07-03 DIAGNOSIS — F411 Generalized anxiety disorder: Secondary | ICD-10-CM

## 2021-07-03 MED ORDER — MESALAMINE 1.2 G PO TBEC: 2.4000 g | DELAYED_RELEASE_TABLET | Freq: Two times a day (BID) | ORAL | 0 refills | Status: DC

## 2021-07-03 NOTE — Telephone Encounter (Signed)
Refill sent. Patient needs to make ov for more refills

## 2021-07-03 NOTE — Telephone Encounter (Signed)
Inbound call from patient stating she needs additional refills for mesalamine medication to be sent to pharmacy in chart please.

## 2021-08-08 ENCOUNTER — Other Ambulatory Visit: Payer: Self-pay | Admitting: Internal Medicine

## 2021-08-08 DIAGNOSIS — F411 Generalized anxiety disorder: Secondary | ICD-10-CM

## 2021-09-05 ENCOUNTER — Other Ambulatory Visit: Payer: Self-pay | Admitting: Internal Medicine

## 2021-09-05 DIAGNOSIS — F411 Generalized anxiety disorder: Secondary | ICD-10-CM

## 2021-09-07 ENCOUNTER — Other Ambulatory Visit: Payer: Self-pay | Admitting: Internal Medicine

## 2021-09-07 DIAGNOSIS — F320 Major depressive disorder, single episode, mild: Secondary | ICD-10-CM

## 2021-10-01 ENCOUNTER — Ambulatory Visit: Payer: BC Managed Care – PPO | Admitting: Internal Medicine

## 2021-10-01 ENCOUNTER — Encounter: Payer: Self-pay | Admitting: Internal Medicine

## 2021-10-01 VITALS — BP 122/80 | HR 76 | Temp 98.0°F | Ht 65.0 in | Wt 136.8 lb

## 2021-10-01 DIAGNOSIS — F411 Generalized anxiety disorder: Secondary | ICD-10-CM | POA: Diagnosis not present

## 2021-10-01 DIAGNOSIS — G4700 Insomnia, unspecified: Secondary | ICD-10-CM

## 2021-10-01 DIAGNOSIS — F330 Major depressive disorder, recurrent, mild: Secondary | ICD-10-CM | POA: Diagnosis not present

## 2021-10-01 MED ORDER — BUPROPION HCL ER (XL) 300 MG PO TB24: 300.0000 mg | ORAL_TABLET | Freq: Every day | ORAL | 1 refills | Status: DC

## 2021-10-01 MED ORDER — ALPRAZOLAM 0.25 MG PO TABS: 0.2500 mg | ORAL_TABLET | Freq: Every evening | ORAL | 2 refills | Status: DC | PRN

## 2021-10-01 NOTE — Progress Notes (Signed)
Established Patient Office Visit     This visit occurred during the SARS-CoV-2 public health emergency.  Safety protocols were in place, including screening questions prior to the visit, additional usage of staff PPE, and extensive cleaning of exam room while observing appropriate contact time as indicated for disinfecting solutions.    CC/Reason for Visit: Medication refills  HPI: [name] is a 66 y.o. female who is coming in today for the above mentioned reasons. Past Medical History is significant for: Ulcerative colitis, depression on Wellbutrin and insomnia on alprazolam at bedtime.  She was asked to come in for medication refills as I have not seen her since June 2021.  She states she has a lot of social stressors.  But is doing relatively well.  She is due for colonoscopy this year.  She is also overdue for flu and COVID boosters as well as cancer screening.   Past Medical/Surgical History: Past Medical History:  Diagnosis Date   Anxiety    Clostridioides difficile infection    Depression    Migraine    Palpitations    Pancreatitis    Ulcerative colitis [facility])     Past Surgical History:  Procedure Laterality Date   APPENDECTOMY Right    BIOPSY  08/18/2019   Procedure: BIOPSY;  Surgeon: Jackquline Denmark, MD;  Location: [facility];  Service: Endoscopy;;   COLONOSCOPY N/A 08/18/2019   Procedure: COLONOSCOPY;  Surgeon: Jackquline Denmark, MD;  [facility]: [facility];  Service: Endoscopy;  Laterality: N/A;   ESOPHAGOGASTRODUODENOSCOPY N/A 08/18/2019   Procedure: ESOPHAGOGASTRODUODENOSCOPY (EGD);  Surgeon: Jackquline Denmark, MD;  Location: [facility];  Service: Endoscopy;  Laterality: N/A;   POLYPECTOMY  08/18/2019   Procedure: POLYPECTOMY;  Surgeon: Jackquline Denmark, MD;  [facility]: [facility];  Service: Endoscopy;;   TONSILLECTOMY      Social History:  reports that she has been smoking cigarettes. She has been smoking an average of .5 packs per day. She has never used  smokeless tobacco. She reports current alcohol use. She reports current drug use. Drug: Marijuana.  Allergies: No Known Allergies  Family History:  Family History  Problem Relation Age of Onset   Prostate cancer Father    Hypertension Mother    Liver disease Brother        alcohol related   Liver disease Brother        alcohol related   Colon cancer Neg Hx    Esophageal cancer Neg Hx    Stomach cancer Neg Hx    Pancreatic cancer Neg Hx      Current Outpatient Medications:    calcium-vitamin D 250-100 MG-UNIT tablet, Take 1 tablet by mouth 2 (two) times daily., Disp: , Rfl:    mesalamine (LIALDA) 1.2 g EC tablet, Take 2 tablets (2.4 g total) by mouth 2 (two) times daily. Please call [phone] to schedule an appointment for more refill, Disp: 360 tablet, Rfl: 0   Multiple Vitamin (MULTIVITAMIN) tablet, Take 1 tablet by mouth daily., Disp: , Rfl:    ALPRAZolam (XANAX) 0.25 MG tablet, Take 1 tablet (0.25 mg total) by mouth at bedtime as needed for sleep., Disp: 30 tablet, Rfl: 2   buPROPion (WELLBUTRIN XL) 300 MG 24 hr tablet, Take 1 tablet (300 mg total) by mouth daily., Disp: 90 tablet, Rfl: 1  Review of Systems:  Constitutional: Denies fever, chills, diaphoresis, appetite change and fatigue.  HEENT: Denies photophobia, eye pain, redness, hearing loss, ear pain, congestion, sore throat, rhinorrhea, sneezing, mouth sores, trouble  swallowing, neck pain, neck stiffness and tinnitus.   Respiratory: Denies SOB, DOE, cough, chest tightness,  and wheezing.   Cardiovascular: Denies chest pain, palpitations and leg swelling.  Gastrointestinal: Denies nausea, vomiting, abdominal pain, diarrhea, constipation, blood in stool and abdominal distention.  Genitourinary: Denies dysuria, urgency, frequency, hematuria, flank pain and difficulty urinating.  Endocrine: Denies: hot or cold intolerance, sweats, changes in hair or nails, polyuria, polydipsia. Musculoskeletal: Denies myalgias, back pain,  joint swelling, arthralgias and gait problem.  Skin: Denies pallor, rash and wound.  Neurological: Denies dizziness, seizures, syncope, weakness, light-headedness, numbness and headaches.  Hematological: Denies adenopathy. Easy bruising, personal or family bleeding history  Psychiatric/Behavioral: Denies suicidal ideation, mood changes, confusion, nervousness, sleep disturbance and agitation    Physical Exam: Vitals:   10/01/21 1537  BP: 122/80  Pulse: 76  Temp: 98 F (36.7 C)  TempSrc: Oral  SpO2: 96%  Weight: 136 lb 12.8 oz (62.1 kg)  Height: 5' 5"  (1.651 m)    Body mass index is 22.76 kg/m.   Constitutional: NAD, calm, comfortable Eyes: PERRL, lids and conjunctivae normal ENMT: Mucous membranes are moist.  Respiratory: clear to auscultation bilaterally, no wheezing, no crackles. Normal respiratory effort. No accessory muscle use.  Cardiovascular: Regular rate and rhythm, no murmurs / rubs / gallops. No extremity edema.  Neurologic: Grossly intact and nonfocal Psychiatric: Normal judgment and insight. Alert and oriented x 3. Normal mood.    Impression and Plan:  Current mild episode of major depressive disorder, recurrent - Plan: buPROPion (WELLBUTRIN XL) 300 MG 24 hr tablet  [place] Office Visit from 10/01/2021 in [place] [facility] [place]  PHQ-9 Total Score 7      -Mood is stable, she is still having issues sleeping.  Bupropion refilled.  Insomnia, unspecified type -Refill alprazolam at bedtime.  -She will schedule follow-up for CPE as well as follow-up with GI.   Time spent: 22 minutes reviewing chart, interviewing and examining patient and formulating plan of care.     Lelon Frohlich, MD [facility] at [facility]

## 2021-10-07 ENCOUNTER — Ambulatory Visit: Payer: BC Managed Care – PPO | Admitting: Internal Medicine

## 2021-11-01 ENCOUNTER — Ambulatory Visit: Payer: BC Managed Care – PPO | Admitting: Gastroenterology

## 2021-11-01 ENCOUNTER — Other Ambulatory Visit: Payer: Self-pay

## 2021-11-01 ENCOUNTER — Encounter: Payer: Self-pay | Admitting: Gastroenterology

## 2021-11-01 VITALS — BP 130/82 | HR 64 | Ht 65.0 in | Wt 136.2 lb

## 2021-11-01 DIAGNOSIS — K219 Gastro-esophageal reflux disease without esophagitis: Secondary | ICD-10-CM | POA: Diagnosis not present

## 2021-11-01 DIAGNOSIS — K51019 Ulcerative (chronic) pancolitis with unspecified complications: Secondary | ICD-10-CM

## 2021-11-01 DIAGNOSIS — R6881 Early satiety: Secondary | ICD-10-CM

## 2021-11-01 DIAGNOSIS — R110 Nausea: Secondary | ICD-10-CM | POA: Diagnosis not present

## 2021-11-01 MED ORDER — MESALAMINE 1.2 G PO TBEC: 2.4000 g | DELAYED_RELEASE_TABLET | Freq: Every day | ORAL | 3 refills | Status: DC

## 2021-11-01 MED ORDER — ONDANSETRON 4 MG PO TBDP: 4.0000 mg | ORAL_TABLET | Freq: Three times a day (TID) | ORAL | 2 refills | Status: DC | PRN

## 2021-11-01 MED ORDER — PANTOPRAZOLE SODIUM 40 MG PO TBEC: 40.0000 mg | DELAYED_RELEASE_TABLET | Freq: Every day | ORAL | 3 refills | Status: DC

## 2021-11-01 NOTE — Progress Notes (Signed)
? ? ?Chief Complaint:  ? ?Referring Provider:  Isaac Bliss, Estel*    ? ? ?ASSESSMENT AND PLAN;  ? ?#1. GERD with nausea/early satiety ? ?#2. 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  Recent exacerbation 07/2019, treated with prednisone and then maintained on mesalamine. ? ?Plan: ?- Protonix 3m po QD #30 6RF ?- mesalamine 2.4g po QAM (decreased dose) ?- Check CBC, CMP, CRP, sed rate. TSH ?- Zofran 0.4 mg ODT Q8hrs #30, 2RF. ?- EGD/colon with miralax ?- Call [place] with any problems until follow-up. ?- If still with problems and above WU is neg, would proceed with CT scan abdo/pelvis. ? ? ? ?HPI:   ? ?Paige MELLERis a 66y.o. female  ?For follow-up visit ? ?Nausea, hearburn with early satiety x worsening over last 3 months.  She starts having nausea as soon as she gets up in the morning.  Very disabling.  Has not lost weight.  She thinks it may be due to p.m. dose of mesalamine.  She has been taking 2.4 g p.o. twice daily (4 tabs total) ? ?No blood in stool. No diarrhea.  No weight loss.  No odynophagia or dysphagia. ? ?Just does not feel good. ? ?Does admit that she has a stressful job and does not sleep very well.  She is also POA for her mom.  ? ? ? ? ? ?Previous GI work-up: ?Last colonoscopy 08/18/2019: Moderately active pancolitis.  Normal TI. ? ?CT AP 07/2019 ?1. Mild wall thickening suggested throughout the colon.  ?2. There are sigmoid colon diverticula but no evidence of ?diverticulitis. ?3. No other acute finding within the abdomen pelvis. Prominent to ?mildly enlarged gastrohepatic ligament lymph nodes, most likely ?chronic and reactive. ?4. Possible small stones in the gallbladder neck and cystic duct, ?but no evidence of acute cholecystitis. ? ?[place] ?No visible cholelithiasis, choledocholithiasis, biliary dilatation ?or features of acute cholecystitis. ? ?Blood tests including TB Gold, TPMT, CBC, C-reactive protein, CMP were within normal limits. ? ?Wt Readings from Last 3 Encounters:  ?11/01/21 136  lb 4 oz (61.8 kg)  ?10/01/21 136 lb 12.8 oz (62.1 kg)  ?06/01/20 134 lb (60.8 kg)  ? ? ? ?Past Medical History:  ?Diagnosis Date  ? Anxiety   ? Clostridioides difficile infection   ? Depression   ? Migraine   ? Palpitations   ? Pancreatitis   ? [place]   ? ? ?Past Surgical History:  ?Procedure Laterality Date  ? APPENDECTOMY Right   ? BIOPSY  08/18/2019  ? Procedure: BIOPSY;  Surgeon: GJackquline Denmark MD;  [facility];  Service: Endoscopy;;  ? COLONOSCOPY N/A 08/18/2019  ? Procedure: COLONOSCOPY;  Surgeon: GJackquline Denmark MD;  [facility];  Service: Endoscopy;  Laterality: N/A;  ? ESOPHAGOGASTRODUODENOSCOPY N/A 08/18/2019  ? Procedure: ESOPHAGOGASTRODUODENOSCOPY (EGD);  Surgeon: GJackquline Denmark MD;  [facility];  Service: Endoscopy;  Laterality: N/A;  ? POLYPECTOMY  08/18/2019  ? Procedure: POLYPECTOMY;  Surgeon: GJackquline Denmark MD;  [facility];  Service: Endoscopy;;  ? TONSILLECTOMY    ? ? ?Family History  ?Problem Relation Age of Onset  ? Prostate cancer Father   ? Hypertension Mother   ? Liver disease Brother   ?     alcohol related  ? Liver disease Brother   ?     alcohol related  ? Colon cancer Neg Hx   ? Esophageal cancer Neg Hx   ? Stomach cancer Neg Hx   ? Pancreatic cancer Neg  Hx   ? ? ?Social History  ? ?Tobacco Use  ? Smoking status: Every Day  ?  Packs/day: 0.50  ?  Types: Cigarettes  ? Smokeless tobacco: Never  ?Vaping Use  ? Vaping Use: Never used  ?Substance Use Topics  ? Alcohol use: Yes  ?  Comment: socially  ? Drug use: Yes  ?  Types: Marijuana  ?  Comment: every couple of days at night  ? ? ?Current Outpatient Medications  ?Medication Sig Dispense Refill  ? ALPRAZolam (XANAX) 0.25 MG tablet Take 1 tablet (0.25 mg total) by mouth at bedtime as needed for sleep. 30 tablet 2  ? buPROPion (WELLBUTRIN XL) 300 MG 24 hr tablet Take 1 tablet (300 mg total) by mouth daily. 90 tablet 1  ? calcium-vitamin D 250-100 MG-UNIT tablet Take 1 tablet by mouth  2 (two) times daily.    ? mesalamine (LIALDA) 1.2 g EC tablet Take 2 tablets (2.4 g total) by mouth 2 (two) times daily. Please call [phone] to schedule an appointment for more refill 360 tablet 0  ? Multiple Vitamin (MULTIVITAMIN) tablet Take 1 tablet by mouth daily.    ? ?No current facility-administered medications for this visit.  ? ? ?No Known Allergies ? ?Review of Systems:  ?As above ? ?  ? ?Physical Exam:   ? ?BP 130/82   Pulse 64   Ht 5' 5"  (1.651 m)   Wt 136 lb 4 oz (61.8 kg)   SpO2 98%   BMI 22.67 kg/m?  ?Wt Readings from Last 3 Encounters:  ?11/01/21 136 lb 4 oz (61.8 kg)  ?10/01/21 136 lb 12.8 oz (62.1 kg)  ?06/01/20 134 lb (60.8 kg)  ? ?Constitutional:  Well-developed, in no acute distress. ?Psychiatric: Normal mood and affect. Behavior is normal. ?HEENT:  Conjunctivae are normal. No scleral icterus. ?Cardiovascular: Normal rate, regular rhythm. No edema ?Pulmonary/chest: Effort normal and breath sounds normal. No wheezing, rales or rhonchi. ?Abdominal: Soft, nondistended. Nontender. Bowel sounds active throughout. There are no masses palpable. No hepatomegaly. ?Rectal:  defered ?Neurological: Alert and oriented to person place and time. ?Skin: Skin is warm and dry. No rashes noted. ? ?Data Reviewed: I have personally reviewed following labs and imaging studies ? ?CBC: ?CBC Latest Ref Rng & Units 01/27/2020 09/08/2019 08/15/2019  ?WBC 4.0 - 10.5 K/uL 6.1 11.6(H) 8.8  ?Hemoglobin 12.0 - 15.0 g/dL 13.5 12.2 10.8(L)  ?Hematocrit 36.0 - 46.0 % 39.2 36.9 32.6(L)  ?Platelets 150.0 - 400.0 K/uL 201.0 229.0 223  ? ? ?CMP: ?CMP Latest Ref Rng & Units 01/27/2020 08/17/2019 08/16/2019  ?Glucose 70 - 99 mg/dL 99 104(H) 122(H)  ?BUN 6 - 23 mg/dL 26(H) <5(L) <5(L)  ?Creatinine 0.40 - 1.20 mg/dL 0.88 0.55 0.65  ?Sodium 135 - 145 mEq/L 139 136 138  ?Potassium 3.5 - 5.1 mEq/L 4.4 3.9 3.6  ?Chloride 96 - 112 mEq/L 106 106 108  ?CO2 19 - 32 mEq/L 27 24 23   ?Calcium 8.4 - 10.5 mg/dL 9.1 7.9(L) 7.6(L)  ?Total Protein  6.0 - 8.3 g/dL 6.7 - -  ?Total Bilirubin 0.2 - 1.2 mg/dL 0.5 - -  ?Alkaline Phos 39 - 117 U/L 60 - -  ?AST 0 - 37 U/L 15 - -  ?ALT 0 - 35 U/L 16 - -  ? ? ? ? ?Carmell Austria, MD 11/01/2021, 4:22 PM ? ?Cc: Isaac Bliss, Estel* ? ? ?

## 2021-11-01 NOTE — Patient Instructions (Signed)
If you are age 66 or older, your body mass index should be between 23-30. Your Body mass index is 22.67 kg/m?Marland Kitchen If this is out of the aforementioned range listed, please consider follow up with your Primary Care Provider. ? ?If you are age 37 or younger, your body mass index should be between 19-25. Your Body mass index is 22.67 kg/m?Marland Kitchen If this is out of the aformentioned range listed, please consider follow up with your Primary Care Provider.  ? ?________________________________________________________ ? ?The [facility] providers would like to encourage you to use [facility] to communicate with providers for non-urgent requests or questions.  Due to long hold times on the telephone, sending your provider a message by [facility] may be a faster and more efficient way to get a response.  Please allow 48 business hours for a response.  Please remember that this is for non-urgent requests.  ?_______________________________________________________ ? ?Please go to the lab on the 2nd floor [address] Monday through Friday from 8am to 4pm and they are closed from 12pm to 1pm. ? ?We have sent the following medications to your pharmacy for you to pick up at your convenience: ?Lialdi 2.4g take daily ?Protonix 45m daily ?Zofran 426mODT take every 8 hours as needed ? ?You have been scheduled for an endoscopy and colonoscopy. Please follow the written instructions given to you at your visit today. ?Please pick up your prep supplies at the pharmacy within the next 1-3 days. ?If you use inhalers (even only as needed), please bring them with you on the day of your procedure. ? ?Please call with any questions or concerns. ? ?Thank you, ? ?Dr. RaJackquline Denmark ? ?

## 2021-11-04 NOTE — Addendum Note (Signed)
Addended by: Curlene Labrum E on: 11/04/2021 03:22 PM ? ? Modules accepted: Orders ? ?

## 2021-11-08 DIAGNOSIS — J039 Acute tonsillitis, unspecified: Secondary | ICD-10-CM | POA: Diagnosis not present

## 2021-11-08 DIAGNOSIS — R059 Cough, unspecified: Secondary | ICD-10-CM | POA: Diagnosis not present

## 2021-11-08 DIAGNOSIS — Z03818 Encounter for observation for suspected exposure to other biological agents ruled out: Secondary | ICD-10-CM | POA: Diagnosis not present

## 2021-11-08 DIAGNOSIS — Z20822 Contact with and (suspected) exposure to covid-19: Secondary | ICD-10-CM | POA: Diagnosis not present

## 2021-11-18 ENCOUNTER — Other Ambulatory Visit (INDEPENDENT_AMBULATORY_CARE_PROVIDER_SITE_OTHER): Payer: BC Managed Care – PPO

## 2021-11-18 DIAGNOSIS — K51019 Ulcerative (chronic) pancolitis with unspecified complications: Secondary | ICD-10-CM | POA: Diagnosis not present

## 2021-11-18 DIAGNOSIS — R110 Nausea: Secondary | ICD-10-CM | POA: Diagnosis not present

## 2021-11-18 DIAGNOSIS — K219 Gastro-esophageal reflux disease without esophagitis: Secondary | ICD-10-CM | POA: Diagnosis not present

## 2021-11-19 LAB — COMPREHENSIVE METABOLIC PANEL
ALT: 12 IU/L (ref 0–32)
AST: 13 IU/L (ref 0–40)
Albumin/Globulin Ratio: 2 (ref 1.2–2.2)
Albumin: 4.9 g/dL — ABNORMAL HIGH (ref 3.8–4.8)
Alkaline Phosphatase: 84 IU/L (ref 44–121)
BUN/Creatinine Ratio: 15 (ref 12–28)
BUN: 13 mg/dL (ref 8–27)
Bilirubin Total: 0.2 mg/dL (ref 0.0–1.2)
CO2: 20 mmol/L (ref 20–29)
Calcium: 9.7 mg/dL (ref 8.7–10.3)
Chloride: 107 mmol/L — ABNORMAL HIGH (ref 96–106)
Creatinine, Ser: 0.88 mg/dL (ref 0.57–1.00)
Globulin, Total: 2.4 g/dL (ref 1.5–4.5)
Glucose: 102 mg/dL — ABNORMAL HIGH (ref 70–99)
Potassium: 4.9 mmol/L (ref 3.5–5.2)
Sodium: 145 mmol/L — ABNORMAL HIGH (ref 134–144)
Total Protein: 7.3 g/dL (ref 6.0–8.5)
eGFR: 73 mL/min/{1.73_m2} (ref >59)

## 2021-11-19 LAB — C-REACTIVE PROTEIN: CRP: 3 mg/L (ref 0–10)

## 2021-11-19 LAB — CBC WITH DIFFERENTIAL/PLATELET
Basophils Absolute: 0.1 10*3/uL (ref 0.0–0.2)
Basos: 1 %
EOS (ABSOLUTE): 0 10*3/uL (ref 0.0–0.4)
Eos: 0 %
Hematocrit: 37.3 % (ref 34.0–46.6)
Hemoglobin: 12.8 g/dL (ref 11.1–15.9)
Immature Grans (Abs): 0 10*3/uL (ref 0.0–0.1)
Immature Granulocytes: 0 %
Lymphocytes Absolute: 2 10*3/uL (ref 0.7–3.1)
Lymphs: 28 %
MCH: 30.4 pg (ref 26.6–33.0)
MCHC: 34.3 g/dL (ref 31.5–35.7)
MCV: 89 fL (ref 79–97)
Monocytes Absolute: 0.6 10*3/uL (ref 0.1–0.9)
Monocytes: 8 %
Neutrophils Absolute: 4.5 10*3/uL (ref 1.4–7.0)
Neutrophils: 63 %
Platelets: 264 10*3/uL (ref 150–450)
RBC: 4.21 x10E6/uL (ref 3.77–5.28)
RDW: 12 % (ref 11.7–15.4)
WBC: 7.2 10*3/uL (ref 3.4–10.8)

## 2021-11-19 LAB — TSH: TSH: 0.856 u[IU]/mL (ref 0.450–4.500)

## 2021-11-19 LAB — SEDIMENTATION RATE: Sed Rate: 12 mm/hr (ref 0–40)

## 2021-12-05 ENCOUNTER — Encounter: Payer: Self-pay | Admitting: Gastroenterology

## 2021-12-05 ENCOUNTER — Encounter: Payer: Self-pay | Admitting: Certified Registered Nurse Anesthetist

## 2021-12-11 ENCOUNTER — Encounter: Payer: Self-pay | Admitting: Certified Registered Nurse Anesthetist

## 2021-12-12 ENCOUNTER — Ambulatory Visit (AMBULATORY_SURGERY_CENTER): Payer: BC Managed Care – PPO | Admitting: Gastroenterology

## 2021-12-12 ENCOUNTER — Encounter: Payer: Self-pay | Admitting: Gastroenterology

## 2021-12-12 VITALS — BP 138/67 | HR 65 | Temp 96.0°F | Resp 12 | Ht 65.0 in | Wt 136.0 lb

## 2021-12-12 DIAGNOSIS — R110 Nausea: Secondary | ICD-10-CM

## 2021-12-12 DIAGNOSIS — K2960 Other gastritis without bleeding: Secondary | ICD-10-CM

## 2021-12-12 DIAGNOSIS — K219 Gastro-esophageal reflux disease without esophagitis: Secondary | ICD-10-CM | POA: Diagnosis not present

## 2021-12-12 DIAGNOSIS — K5730 Diverticulosis of large intestine without perforation or abscess without bleeding: Secondary | ICD-10-CM | POA: Diagnosis not present

## 2021-12-12 DIAGNOSIS — Z9189 Other specified personal risk factors, not elsewhere classified: Secondary | ICD-10-CM

## 2021-12-12 DIAGNOSIS — K640 First degree hemorrhoids: Secondary | ICD-10-CM | POA: Diagnosis not present

## 2021-12-12 DIAGNOSIS — K449 Diaphragmatic hernia without obstruction or gangrene: Secondary | ICD-10-CM | POA: Diagnosis not present

## 2021-12-12 DIAGNOSIS — K51019 Ulcerative (chronic) pancolitis with unspecified complications: Secondary | ICD-10-CM | POA: Diagnosis not present

## 2021-12-12 DIAGNOSIS — K319 Disease of stomach and duodenum, unspecified: Secondary | ICD-10-CM | POA: Diagnosis not present

## 2021-12-12 DIAGNOSIS — K6289 Other specified diseases of anus and rectum: Secondary | ICD-10-CM | POA: Diagnosis not present

## 2021-12-12 DIAGNOSIS — K6389 Other specified diseases of intestine: Secondary | ICD-10-CM | POA: Diagnosis not present

## 2021-12-12 HISTORY — PX: COLONOSCOPY: SHX174

## 2021-12-12 MED ORDER — SODIUM CHLORIDE 0.9 % IV SOLN: 500.0000 mL | Freq: Once | INTRAVENOUS | Status: DC

## 2021-12-12 NOTE — Progress Notes (Signed)
? ? ?Chief Complaint:  ? ?Referring Provider:  Isaac Bliss, Estel*    ? ? ?ASSESSMENT AND PLAN;  ? ?#1. GERD with nausea/early satiety ? ?#2. 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  Recent exacerbation 07/2019, treated with prednisone and then maintained on mesalamine. ? ?Plan: ?- Protonix 53m po QD #30 6RF ?- mesalamine 2.4g po QAM (decreased dose) ?- EGD/colon with miralax ?- Call [place] with any problems until follow-up. ?- If still with problems and above WU is neg, would proceed with CT scan abdo/pelvis. ? ? ? ?HPI:   ? ?Paige HEMMELGARNis a 66y.o. female  ?For follow-up visit ? ?Nausea, hearburn with early satiety x worsening over last 3 months.  She starts having nausea as soon as she gets up in the morning.  Very disabling.  Has not lost weight.  She thinks it may be due to p.m. dose of mesalamine.  She has been taking 2.4 g p.o. twice daily (4 tabs total) ? ?No blood in stool. No diarrhea.  No weight loss.  No odynophagia or dysphagia. ? ?Just does not feel good. ? ?Does admit that she has a stressful job and does not sleep very well.  She is also POA for her mom.  ? ? ? ? ? ?Previous GI work-up: ?Last colonoscopy 08/18/2019: Moderately active pancolitis.  Normal TI. ? ?CT AP 07/2019 ?1. Mild wall thickening suggested throughout the colon.  ?2. There are sigmoid colon diverticula but no evidence of ?diverticulitis. ?3. No other acute finding within the abdomen pelvis. Prominent to ?mildly enlarged gastrohepatic ligament lymph nodes, most likely ?chronic and reactive. ?4. Possible small stones in the gallbladder neck and cystic duct, ?but no evidence of acute cholecystitis. ? ?[place] ?No visible cholelithiasis, choledocholithiasis, biliary dilatation ?or features of acute cholecystitis. ? ?Blood tests including TB Gold, TPMT, CBC, C-reactive protein, CMP were within normal limits. ? ?Wt Readings from Last 3 Encounters:  ?12/12/21 136 lb (61.7 kg)  ?11/01/21 136 lb 4 oz (61.8 kg)  ?10/01/21 136 lb 12.8 oz  (62.1 kg)  ? ? ? ?Past Medical History:  ?Diagnosis Date  ? Anxiety   ? Clostridioides difficile infection   ? Depression   ? Migraine   ? Palpitations   ? Pancreatitis   ? [place]   ? ? ?Past Surgical History:  ?Procedure Laterality Date  ? APPENDECTOMY Right   ? BIOPSY  08/18/2019  ? Procedure: BIOPSY;  Surgeon: GJackquline Denmark MD;  [facility];  Service: Endoscopy;;  ? COLONOSCOPY N/A 08/18/2019  ? Procedure: COLONOSCOPY;  Surgeon: GJackquline Denmark MD;  [facility];  Service: Endoscopy;  Laterality: N/A;  ? ESOPHAGOGASTRODUODENOSCOPY N/A 08/18/2019  ? Procedure: ESOPHAGOGASTRODUODENOSCOPY (EGD);  Surgeon: GJackquline Denmark MD;  [facility];  Service: Endoscopy;  Laterality: N/A;  ? POLYPECTOMY  08/18/2019  ? Procedure: POLYPECTOMY;  Surgeon: GJackquline Denmark MD;  [facility];  Service: Endoscopy;;  ? TONSILLECTOMY    ? ? ?Family History  ?Problem Relation Age of Onset  ? Prostate cancer Father   ? Hypertension Mother   ? Liver disease Brother   ?     alcohol related  ? Liver disease Brother   ?     alcohol related  ? Colon cancer Neg Hx   ? Esophageal cancer Neg Hx   ? Stomach cancer Neg Hx   ? Pancreatic cancer Neg Hx   ? ? ?Social History  ? ?Tobacco Use  ? Smoking status: Every  Day  ?  Packs/day: 0.50  ?  Types: Cigarettes  ? Smokeless tobacco: Never  ?Vaping Use  ? Vaping Use: Never used  ?Substance Use Topics  ? Alcohol use: Yes  ?  Comment: socially  ? Drug use: Yes  ?  Types: Marijuana  ?  Comment: every couple of days at night  ? ? ?Current Outpatient Medications  ?Medication Sig Dispense Refill  ? ALPRAZolam (XANAX) 0.25 MG tablet Take 1 tablet (0.25 mg total) by mouth at bedtime as needed for sleep. 30 tablet 2  ? buPROPion (WELLBUTRIN XL) 300 MG 24 hr tablet Take 1 tablet (300 mg total) by mouth daily. 90 tablet 1  ? calcium-vitamin D 250-100 MG-UNIT tablet Take 1 tablet by mouth 2 (two) times daily.    ? Multiple Vitamin (MULTIVITAMIN) tablet Take 1  tablet by mouth daily.    ? mesalamine (LIALDA) 1.2 g EC tablet Take 2 tablets (2.4 g total) by mouth daily with breakfast. (Patient not taking: Reported on 12/12/2021) 180 tablet 3  ? pantoprazole (PROTONIX) 40 MG tablet Take 1 tablet (40 mg total) by mouth daily. (Patient not taking: Reported on 12/12/2021) 90 tablet 3  ? ?Current Facility-Administered Medications  ?Medication Dose Route Frequency Provider Last Rate Last Admin  ? 0.9 %  sodium chloride infusion  500 mL Intravenous Once Jackquline Denmark, MD      ? ? ?No Known Allergies ? ?Review of Systems:  ?As above ? ?  ? ?Physical Exam:   ? ?BP (!) 141/72   Pulse 64   Temp (!) 96 ?F (35.6 ?C) (Skin)   Ht 5' 5"  (1.651 m)   Wt 136 lb (61.7 kg)   SpO2 98%   BMI 22.63 kg/m?  ?Wt Readings from Last 3 Encounters:  ?12/12/21 136 lb (61.7 kg)  ?11/01/21 136 lb 4 oz (61.8 kg)  ?10/01/21 136 lb 12.8 oz (62.1 kg)  ? ?Constitutional:  Well-developed, in no acute distress. ?Psychiatric: Normal mood and affect. Behavior is normal. ?HEENT:  Conjunctivae are normal. No scleral icterus. ?Cardiovascular: Normal rate, regular rhythm. No edema ?Pulmonary/chest: Effort normal and breath sounds normal. No wheezing, rales or rhonchi. ?Abdominal: Soft, nondistended. Nontender. Bowel sounds active throughout. There are no masses palpable. No hepatomegaly. ?Rectal:  defered ?Neurological: Alert and oriented to person place and time. ?Skin: Skin is warm and dry. No rashes noted. ? ?Data Reviewed: I have personally reviewed following labs and imaging studies ? ?CBC: ? ?  Latest Ref Rng & Units 11/18/2021  ?  1:17 PM 01/27/2020  ?  2:12 PM 09/08/2019  ? 12:04 PM  ?CBC  ?WBC 3.4 - 10.8 x10E3/uL 7.2   6.1   11.6    ?Hemoglobin 11.1 - 15.9 g/dL 12.8   13.5   12.2    ?Hematocrit 34.0 - 46.6 % 37.3   39.2   36.9    ?Platelets 150 - 450 x10E3/uL 264   201.0   229.0    ? ? ?CMP: ? ?  Latest Ref Rng & Units 11/18/2021  ?  1:17 PM 01/27/2020  ?  2:12 PM 08/17/2019  ?  5:56 AM  ?CMP  ?Glucose 70 - 99  mg/dL 102   99   104    ?BUN 8 - 27 mg/dL 13   26   <5    ?Creatinine 0.57 - 1.00 mg/dL 0.88   0.88   0.55    ?Sodium 134 - 144 mmol/L 145   139   136    ?  Potassium 3.5 - 5.2 mmol/L 4.9   4.4   3.9    ?Chloride 96 - 106 mmol/L 107   106   106    ?CO2 20 - 29 mmol/L 20   27   24     ?Calcium 8.7 - 10.3 mg/dL 9.7   9.1   7.9    ?Total Protein 6.0 - 8.5 g/dL 7.3   6.7     ?Total Bilirubin 0.0 - 1.2 mg/dL 0.2   0.5     ?Alkaline Phos 44 - 121 IU/L 84   60     ?AST 0 - 40 IU/L 13   15     ?ALT 0 - 32 IU/L 12   16     ? ? ? ? ?Carmell Austria, MD 12/12/2021, 8:57 AM ? ?Cc: Isaac Bliss, [place]el* ? ? ?

## 2021-12-12 NOTE — Op Note (Signed)
[facility] ?Patient Name: [name] ?Procedure Date: 12/12/2021 8:59 AM ?MRN: [id number] ?Endoscopist: Jackquline Denmark , MD ?Age: 66 ?Referring MD:  ?Date of Birth: 01-21-1956 ?Gender: Female ?Account #: [id number] ?Procedure:                Colonoscopy ?Indications:              High risk colon cancer surveillance: UC Pancolitis.  ?                          Dx Jan 2009. Recent exacerbation 07/2019, treated  ?                          with prednisone and then maintained on mesalamine. ?Medicines:                Monitored Anesthesia Care ?Procedure:                Pre-Anesthesia Assessment: ?                          - Prior to the procedure, a History and Physical  ?                          was performed, and patient medications and  ?                          allergies were reviewed. The patient's tolerance of  ?                          previous anesthesia was also reviewed. The risks  ?                          and benefits of the procedure and the sedation  ?                          options and risks were discussed with the patient.  ?                          All questions were answered, and informed consent  ?                          was obtained. Prior Anticoagulants: The patient has  ?                          taken no previous anticoagulant or antiplatelet  ?                          agents. ASA Grade Assessment: II - A patient with  ?                          mild systemic disease. After reviewing the risks  ?                          and benefits, the patient was deemed in  ?  satisfactory condition to undergo the procedure. ?                          After obtaining informed consent, the colonoscope  ?                          was passed under direct vision. Throughout the  ?                          procedure, the patient's blood pressure, pulse, and  ?                          oxygen saturations were monitored continuously. The  ?                          [place] #[id number] was introduced through  ?                          the anus and advanced to the 2 cm into the ileum.  ?                          The colonoscopy was performed without difficulty.  ?                          The patient tolerated the procedure well. The  ?                          quality of the bowel preparation was good. The  ?                          terminal ileum, ileocecal valve, appendiceal  ?                          orifice, and rectum were photographed. ?Scope In: 9:12:00 AM ?Scope Out: 9:28:56 AM ?Scope Withdrawal Time: 0 hours 13 minutes 22 seconds  ?Total Procedure Duration: 0 hours 16 minutes 56 seconds  ?Findings:                 The colon (entire examined portion) appeared  ?                          normal. Multiple biopsies were taken with a cold  ?                          forceps for histology from right colon, left colon  ?                          and rectum. ?                          A few medium-mouthed diverticula were found in the  ?                          sigmoid colon. ?  Non-bleeding internal hemorrhoids were found during  ?                          retroflexion. The hemorrhoids were small and Grade  ?                          I (internal hemorrhoids that do not prolapse). ?                          The terminal ileum appeared normal. ?                          The exam was otherwise without abnormality on  ?                          direct and retroflexion views. ?Complications:            No immediate complications. ?Estimated Blood Loss:     Estimated blood loss: none. ?Impression:               - The entire examined colon is normal s/o complete  ?                          mucosal healing. Biopsied. ?                          - Mild sigmoid diverticulosis. ?                          - Non-bleeding internal hemorrhoids. ?                          - The examined portion of the ileum was normal. ?                          - The examination  was otherwise normal on direct  ?                          and retroflexion views. ?Recommendation:           - Patient has a contact number available for  ?                          emergencies. The signs and symptoms of potential  ?                          delayed complications were discussed with the  ?                          patient. Return to normal activities tomorrow.  ?                          Written discharge instructions were provided to the  ?                          patient. ?                          -  Resume previous diet. ?                          - Continue present medications including mesalamine  ?                          2.4 g/day. ?                          - Await pathology results. ?                          - Repeat colonoscopy in 3 years for surveillance. ?                          - The findings and recommendations were discussed  ?                          with the patient's family. ?Jackquline Denmark, MD ?12/12/2021 9:40:12 AM ?This report has been signed electronically. ?

## 2021-12-12 NOTE — Op Note (Signed)
[facility] ?Patient Name: [name] ?Procedure Date: 12/12/2021 9:00 AM ?MRN: [id number] ?Endoscopist: Jackquline Denmark , MD ?Age: 66 ?Referring MD:  ?Date of Birth: Oct 12, 1955 ?Gender: Female ?Account #: [id number] ?Procedure:                Upper GI endoscopy ?Indications:              GERD with occ nausea ?Medicines:                Monitored Anesthesia Care ?Procedure:                Pre-Anesthesia Assessment: ?                          - Prior to the procedure, a History and Physical  ?                          was performed, and patient medications and  ?                          allergies were reviewed. The patient's tolerance of  ?                          previous anesthesia was also reviewed. The risks  ?                          and benefits of the procedure and the sedation  ?                          options and risks were discussed with the patient.  ?                          All questions were answered, and informed consent  ?                          was obtained. Prior Anticoagulants: The patient has  ?                          taken no previous anticoagulant or antiplatelet  ?                          agents. ASA Grade Assessment: II - A patient with  ?                          mild systemic disease. After reviewing the risks  ?                          and benefits, the patient was deemed in  ?                          satisfactory condition to undergo the procedure. ?                          After obtaining informed consent, the endoscope was  ?  passed under direct vision. Throughout the  ?                          procedure, the patient's blood pressure, pulse, and  ?                          oxygen saturations were monitored continuously. The  ?                          Endoscope was introduced through the mouth, and  ?                          advanced to the second part of duodenum. The upper  ?                          GI endoscopy was accomplished without  difficulty.  ?                          The patient tolerated the procedure well. ?Scope In: ?Scope Out: ?Findings:                 The examined esophagus was normal with well-defined  ?                          Z-line at 38 cm. ?                          A small hiatal hernia was present, best visualized  ?                          on Valsalva. ?                          Localized mild inflammation characterized by  ?                          erythema was found in the gastric antrum. Biopsies  ?                          were taken with a cold forceps for histology. ?                          The examined duodenum was normal. Biopsies for  ?                          histology were taken with a cold forceps for  ?                          evaluation of celiac disease. ?Complications:            No immediate complications. ?Estimated Blood Loss:     Estimated blood loss: none. ?Impression:               - Small transient hiatal hernia. ?                          -  Gastritis. Biopsied. ?                          - Normal examined duodenum. Biopsied. ?Recommendation:           - Patient has a contact number available for  ?                          emergencies. The signs and symptoms of potential  ?                          delayed complications were discussed with the  ?                          patient. Return to normal activities tomorrow.  ?                          Written discharge instructions were provided to the  ?                          patient. ?                          - Resume previous diet. ?                          - Continue present medications. ?                          - Await pathology results. ?                          - Minimize marijuana ?                          - The findings and recommendations were discussed  ?                          with the patient's family. ?Jackquline Denmark, MD ?12/12/2021 9:32:00 AM ?This report has been signed electronically. ?

## 2021-12-12 NOTE — Progress Notes (Signed)
0859 Robinul 0.1 mg IV given due large amount of secretions upon assessment.  MD made aware, vss  ?

## 2021-12-12 NOTE — Patient Instructions (Signed)
YOU HAD AN ENDOSCOPIC PROCEDURE TODAY AT [facility]:   Refer to the procedure report that was given to you for any specific questions about what was found during the examination.  If the procedure report does not answer your questions, please call your gastroenterologist to clarify.  If you requested that your care partner not be given the details of your procedure findings, then the procedure report has been included in a sealed envelope for you to review at your convenience later. ? ?**Handouts given om hemorrhoids and diverticulosis** ? ?YOU SHOULD EXPECT: Some feelings of bloating in the abdomen. Passage of more gas than usual.  Walking can help get rid of the air that was put into your GI tract during the procedure and reduce the bloating. If you had a lower endoscopy (such as a colonoscopy or flexible sigmoidoscopy) you may notice spotting of blood in your stool or on the toilet paper. If you underwent a bowel prep for your procedure, you may not have a normal bowel movement for a few days. ? ?Please Note:  You might notice some irritation and congestion in your nose or some drainage.  This is from the oxygen used during your procedure.  There is no need for concern and it should clear up in a day or so. ? ?SYMPTOMS TO REPORT IMMEDIATELY: ? ?Following lower endoscopy (colonoscopy or flexible sigmoidoscopy): ? Excessive amounts of blood in the stool ? Significant tenderness or worsening of abdominal pains ? Swelling of the abdomen that is new, acute ? Fever of 100?F or higher ? ?Following upper endoscopy (EGD) ? Vomiting of blood or coffee ground material ? New chest pain or pain under the shoulder blades ? Painful or persistently difficult swallowing ? New shortness of breath ? Fever of 100?F or higher ? Black, tarry-looking stools ? ?For urgent or emergent issues, a gastroenterologist can be reached at any hour by calling [phone]. ?Do not use MyChart messaging for urgent concerns.   ? ? ?DIET:  We do recommend a small meal at first, but then you may proceed to your regular diet.  Drink plenty of fluids but you should avoid alcoholic beverages for 24 hours. ? ?ACTIVITY:  You should plan to take it easy for the rest of today and you should NOT DRIVE or use heavy machinery until tomorrow (because of the sedation medicines used during the test).   ? ?FOLLOW UP: ?Our staff will call the number listed on your records 48-72 hours following your procedure to check on you and address any questions or concerns that you may have regarding the information given to you following your procedure. If we do not reach you, we will leave a message.  We will attempt to reach you two times.  During this call, we will ask if you have developed any symptoms of COVID 19. If you develop any symptoms (ie: fever, flu-like symptoms, shortness of breath, cough etc.) before then, please call [phone].  If you test positive for Covid 19 in the 2 weeks post procedure, please call and report this information to [place].   ? ?If any biopsies were taken you will be contacted by phone or by letter within the next 1-3 weeks.  Please call us at [phone] if you have not heard about the biopsies in 3 weeks.  ? ? ?SIGNATURES/CONFIDENTIALITY: ?You and/or your care partner have signed paperwork which will be entered into your electronic medical record.  These signatures attest to the fact that  that the information above on your After Visit Summary has been reviewed and is understood.  Full responsibility of the confidentiality of this discharge information lies with you and/or your care-partner.  ?

## 2021-12-12 NOTE — Progress Notes (Signed)
Called to room to assist during endoscopic procedure.  Patient ID and intended procedure confirmed with present staff. Received instructions for my participation in the procedure from the performing physician.  

## 2021-12-12 NOTE — Progress Notes (Signed)
Report given to PACU, vss 

## 2021-12-12 NOTE — Progress Notes (Signed)
Pt's states no medical or surgical changes since previsit or office visit. VS assessed by [facility] ?

## 2021-12-16 ENCOUNTER — Telehealth: Payer: Self-pay | Admitting: *Deleted

## 2021-12-16 ENCOUNTER — Encounter: Payer: Self-pay | Admitting: Gastroenterology

## 2021-12-16 NOTE — Telephone Encounter (Signed)
?  Follow up Call- ? ? ?  12/12/2021  ?  8:23 AM  ?Call back number  ?Post procedure Call Back phone  # [phone]  ?Permission to leave phone message Yes  ?  ? ?Patient questions: ? ?Do you have a fever, pain , or abdominal swelling? No. ?Pain Score  0 * ? ?Have you tolerated food without any problems? Yes.   ? ?Have you been able to return to your normal activities? Yes.   ? ?Do you have any questions about your discharge instructions: ?Diet   No. ?Medications  No. ?Follow up visit  No. ? ?Do you have questions or concerns about your Care? No. ? ?Actions: ?* If pain score is 4 or above: ?No action needed, pain <4. ? ? ?

## 2022-01-03 ENCOUNTER — Other Ambulatory Visit: Payer: Self-pay | Admitting: Internal Medicine

## 2022-01-04 ENCOUNTER — Encounter: Payer: Self-pay | Admitting: Gastroenterology

## 2022-01-09 ENCOUNTER — Telehealth: Payer: Self-pay | Admitting: Gastroenterology

## 2022-01-09 NOTE — Telephone Encounter (Signed)
Are you willing to prescribe this Dr Lyndel Safe?

## 2022-01-09 NOTE — Telephone Encounter (Signed)
We received a call from patient requesting a refill for ondansetron medication. Per patient, can be [facility] to [facility]: [address], [place] [address]. Please advise.

## 2022-01-13 NOTE — Telephone Encounter (Signed)
Please do. Can do Zofran 4 mg ODT Q 8 hours as needed #30 2RF RG

## 2022-01-14 ENCOUNTER — Telehealth: Payer: Self-pay | Admitting: Internal Medicine

## 2022-01-14 DIAGNOSIS — F411 Generalized anxiety disorder: Secondary | ICD-10-CM

## 2022-01-14 MED ORDER — ONDANSETRON 4 MG PO TBDP
4.0000 mg | ORAL_TABLET | Freq: Three times a day (TID) | ORAL | 2 refills | Status: DC | PRN
Start: 2022-01-14 — End: 2023-03-06

## 2022-01-14 NOTE — Telephone Encounter (Signed)
Pt call and stated she need a refill on ALPRAZolam (XANAX) 0.25 MG tablet  sent to  [id number] [place],  - [id number] Phone:  [phone] Fax:  [phone]

## 2022-01-14 NOTE — Telephone Encounter (Signed)
done

## 2022-01-15 MED ORDER — ALPRAZOLAM 0.25 MG PO TABS: 0.2500 mg | ORAL_TABLET | Freq: Every evening | ORAL | 2 refills | Status: DC | PRN

## 2022-01-16 ENCOUNTER — Other Ambulatory Visit: Payer: Self-pay | Admitting: Internal Medicine

## 2022-01-16 DIAGNOSIS — F411 Generalized anxiety disorder: Secondary | ICD-10-CM

## 2022-01-17 NOTE — Telephone Encounter (Signed)
[facility] is calling and [facility] in [place] [id number] - [place], [place] Phone:  [phone]  Fax:  [phone]    Pt is aware md not in office until Tuesday 01-21-2022

## 2022-01-18 ENCOUNTER — Other Ambulatory Visit: Payer: Self-pay | Admitting: Gastroenterology

## 2022-01-21 ENCOUNTER — Telehealth: Payer: Self-pay | Admitting: Internal Medicine

## 2022-01-21 ENCOUNTER — Other Ambulatory Visit: Payer: Self-pay | Admitting: Internal Medicine

## 2022-01-21 DIAGNOSIS — F411 Generalized anxiety disorder: Secondary | ICD-10-CM

## 2022-01-21 NOTE — Telephone Encounter (Signed)
Patient called back in regards to location she would like Rx sent. She states that she just got off the phone with someone and stated [facility], however she says she found out they do not have a pharmacy.  Patient states that she would like the Rx called into:   [facility]

## 2022-01-21 NOTE — Telephone Encounter (Signed)
Dr Lyndel Safe this pt is asking for lower cost PPI and would like to see about omeperazole per pharmacy note. Please advise

## 2022-01-21 NOTE — Telephone Encounter (Signed)
Pt has found a pharmacy that has the medication  ALPRAZolam [name]) 0.25 MG tablet  [facility]  [place]  [phone]. She asks that if there is any authorization needed that it is provided so that she can obtain her medication

## 2022-01-21 NOTE — Telephone Encounter (Signed)
Pt is calling and she is now at [facility] did not have alprazolam. Please send to [facility]prazolam [id number] [place] - [address] Phone:  [phone] Fax:  [phone]

## 2022-01-22 MED ORDER — ALPRAZOLAM 0.25 MG PO TABS: 0.2500 mg | ORAL_TABLET | Freq: Every evening | ORAL | 2 refills | Status: DC | PRN

## 2022-01-22 NOTE — Telephone Encounter (Signed)
Refill requested from the requested pharmacy sent to [facility] to fill.

## 2022-04-07 ENCOUNTER — Encounter: Payer: Self-pay | Admitting: Family Medicine

## 2022-04-07 ENCOUNTER — Ambulatory Visit: Payer: BC Managed Care – PPO | Admitting: Family Medicine

## 2022-04-07 VITALS — BP 120/80 | HR 74 | Temp 98.5°F | Wt 128.0 lb

## 2022-04-07 DIAGNOSIS — M25511 Pain in right shoulder: Secondary | ICD-10-CM | POA: Diagnosis not present

## 2022-04-07 DIAGNOSIS — G8929 Other chronic pain: Secondary | ICD-10-CM

## 2022-04-07 MED ORDER — METHYLPREDNISOLONE 4 MG PO TBPK: ORAL_TABLET | ORAL | 0 refills | Status: DC

## 2022-04-07 NOTE — Progress Notes (Signed)
   Subjective:    Patient ID: [name], female    DOB: 1955-10-09, 66 y.o.   MRN: [id number]  HPI Here for 3 months of pain in the right shoulder. This started the day after she played some volleyball in a swimming pool. The pain starts on the lateral shoulder and radiates down to the right elbow. There is no pain when her arm is down but she cannot raise the arm above her head due to the pain. Talking Tylenol. No numbness or weakness in the arm or hand. No neck pain.   Review of Systems  Constitutional: Negative.   Respiratory: Negative.    Cardiovascular: Negative.   Musculoskeletal:  Positive for arthralgias.       Objective:   Physical Exam Constitutional:      General: She is not in acute distress.    Appearance: Normal appearance.  Cardiovascular:     Rate and Rhythm: Normal rate and regular rhythm.     Pulses: Normal pulses.     Heart sounds: Normal heart sounds.  Pulmonary:     Effort: Pulmonary effort is normal.     Breath sounds: Normal breath sounds.  Musculoskeletal:     Comments:  Right shoulder is mildly tender in the anterior areas. ROM is limited by pain when trying to extend or abduct the right arm actively. Passively she can get her arm above her head with pain. Internal rotation is normal, but external rotation is limited by pain  Neurological:     Mental Status: She is alert.           Assessment & Plan:  Right shoulder pain consistent with rotator cuff tendonitis. She will try a Medrol dose pack. Follow up as needed.  Alysia Penna, MD

## 2022-04-08 ENCOUNTER — Other Ambulatory Visit: Payer: Self-pay | Admitting: Internal Medicine

## 2022-04-08 DIAGNOSIS — F330 Major depressive disorder, recurrent, mild: Secondary | ICD-10-CM

## 2022-04-11 ENCOUNTER — Telehealth: Payer: Self-pay | Admitting: Internal Medicine

## 2022-04-11 DIAGNOSIS — M25511 Pain in right shoulder: Secondary | ICD-10-CM

## 2022-04-11 NOTE — Telephone Encounter (Signed)
Pt called to ask if she can have an Ortho Referral for her rotator cuff.  [phone]

## 2022-04-12 ENCOUNTER — Encounter (HOSPITAL_BASED_OUTPATIENT_CLINIC_OR_DEPARTMENT_OTHER): Payer: Self-pay | Admitting: Emergency Medicine

## 2022-04-12 ENCOUNTER — Emergency Department (HOSPITAL_BASED_OUTPATIENT_CLINIC_OR_DEPARTMENT_OTHER)
Admission: EM | Admit: 2022-04-12 | Discharge: 2022-04-12 | Disposition: A | Discharge status: Home / Self Care | Payer: BC Managed Care – PPO | Attending: Emergency Medicine | Admitting: Emergency Medicine

## 2022-04-12 ENCOUNTER — Other Ambulatory Visit: Payer: Self-pay

## 2022-04-12 ENCOUNTER — Emergency Department (HOSPITAL_BASED_OUTPATIENT_CLINIC_OR_DEPARTMENT_OTHER): Payer: BC Managed Care – PPO

## 2022-04-12 DIAGNOSIS — M25511 Pain in right shoulder: Secondary | ICD-10-CM | POA: Diagnosis not present

## 2022-04-12 DIAGNOSIS — R030 Elevated blood-pressure reading, without diagnosis of hypertension: Secondary | ICD-10-CM | POA: Insufficient documentation

## 2022-04-12 DIAGNOSIS — W19XXXA Unspecified fall, initial encounter: Secondary | ICD-10-CM

## 2022-04-12 DIAGNOSIS — W1839XA Other fall on same level, initial encounter: Secondary | ICD-10-CM | POA: Insufficient documentation

## 2022-04-12 NOTE — ED Notes (Signed)
ED Provider at bedside. 

## 2022-04-12 NOTE — ED Triage Notes (Signed)
Pt arrives pov, endorses mechanical fall yesterday, c/o increased pain and decreased ROM to RT shoulder. Denies head injury

## 2022-04-12 NOTE — Discharge Instructions (Addendum)
At this time there does not appear to be the presence of an emergent medical condition, however there is always the potential for conditions to change. Please read and follow the below instructions.  Please return to the Emergency Department immediately for any new or worsening symptoms. Please be sure to follow up with your Primary Care Provider within one week regarding your visit today; please call their office to schedule an appointment even if you are feeling better for a follow-up visit. You may call the on-call orthopedic specialist Dr. Lynann Bologna on your discharge paperwork for follow-up regarding your right shoulder pain.  You may use the sling for support of your shoulder.  Please perform gentle range of motion exercises as we discussed to help avoid shoulder stiffness. Your blood pressure was elevated in the emergency department today.  Please have it rechecked by your primary care provider this week and be sure to take your blood pressure medication as prescribed by your primary care provider.   Please read the additional information packets attached to your discharge summary.  Go to the nearest Emergency Department immediately if: You have fever or chills Your arm, hand, or fingers: Tingle. Are numb. Are swollen. Are painful. Turn white or blue You get a very bad headache. You start to feel mixed up (confused). You feel weak or numb. You feel faint. You have very bad pain in your: Chest. Belly (abdomen). You vomit more than once. You have trouble breathing. You have any new/concerning or worsening of symptoms.  Do not take your medicine if  develop an itchy rash, swelling in your mouth or lips, or difficulty breathing; call 911 and seek immediate emergency medical attention if this occurs.  You may review your lab tests and imaging results in their entirety on your MyChart account.  Please discuss all results of fully with your primary care provider and other specialist at  your follow-up visit.  Note: Portions of this text may have been transcribed using voice recognition software. Every effort was made to ensure accuracy; however, inadvertent computerized transcription errors may still be present.\

## 2022-04-12 NOTE — ED Provider Notes (Signed)
[facility] EMERGENCY DEPARTMENT Provider Note   CSN: [id number] Arrival date & time: 04/12/22  0841     History  Chief Complaint  Patient presents with   [name] is a 66 y.o. female reports is otherwise healthy no daily medication use presented for evaluation of right shoulder pain.  Patient reports she suffered a fall yesterday, lost her balance fell directly onto the right shoulder, she reports that she had immediate sharp pain that is moderate intensity worsens with movement improves somewhat with rest, pain does not radiate.  Patient reports a mild tingling sensation around the area at time of injury that has improved.  Of note patient reports has been dealing with right shoulder pain for the past 3 months, she reports she played volleyball in May 2023 and had some right shoulder pain at that time her primary care provider diagnosed her with rotator cuff tendinopathy after a visit last week, patient was prescribed prednisone last week which she had finished yesterday just before her fall.  She reports her right shoulder has been feeling better until her fall.  Patient denies head injury, loss conscious, blood thinner use, headache, neck pain, back pain, chest pain, abdominal pain or any additional injuries or concerns.  HPI     Home Medications Prior to Admission medications   Medication Sig Start Date End Date Taking? Authorizing Provider  ALPRAZolam (XANAX) 0.25 MG tablet Take 1 tablet (0.25 mg total) by mouth at bedtime as needed for sleep. 01/22/22   Isaac Bliss, Rayford Halsted, MD  buPROPion (WELLBUTRIN XL) 300 MG 24 hr tablet TAKE 1 TABLET BY MOUTH EVERY DAY 04/08/22   Isaac Bliss, Rayford Halsted, MD  calcium-vitamin D 250-100 MG-UNIT tablet Take 1 tablet by mouth 2 (two) times daily.    [provider]  mesalamine (LIALDA) 1.2 g EC tablet Take 2 tablets (2.4 g total) by mouth daily with breakfast. 01/03/22   Jackquline Denmark, MD  Multiple Vitamin  (MULTIVITAMIN) tablet Take 1 tablet by mouth daily.    [provider]  omeprazole (PRILOSEC) 20 MG capsule Take 1 capsule (20 mg total) by mouth 2 (two) times daily before a meal. 02/02/22   Jackquline Denmark, MD  ondansetron (ZOFRAN-ODT) 4 MG disintegrating tablet Take 1 tablet (4 mg total) by mouth every 8 (eight) hours as needed for nausea or vomiting. 01/14/22   Jackquline Denmark, MD      Allergies    Patient has no known allergies.    Review of Systems   Review of Systems  Cardiovascular: Negative.  Negative for chest pain.  Gastrointestinal: Negative.  Negative for abdominal pain.  Musculoskeletal:  Positive for arthralgias. Negative for back pain and neck pain.  Neurological: Negative.  Negative for syncope and headaches.    Physical Exam Updated Vital Signs BP (!) 151/69   Pulse 68   Temp 98.4 F (36.9 C) (Oral)   Resp 18   Ht 5' 5"  (1.651 m)   Wt 59.9 kg   SpO2 100%   BMI 21.97 kg/m  Physical Exam Constitutional:      General: She is not in acute distress.    Appearance: Normal appearance. She is well-developed. She is not ill-appearing or diaphoretic.  HENT:     Head: Normocephalic and atraumatic. No raccoon eyes or Battle's sign.     Jaw: There is normal jaw occlusion.     Right Ear: External ear normal. No hemotympanum.     Left Ear: External  ear normal. No hemotympanum.     Nose: Nose normal.     Mouth/Throat:     Mouth: Mucous membranes are moist.     Pharynx: Oropharynx is clear.  Eyes:     General: Vision grossly intact. Gaze aligned appropriately.     Pupils: Pupils are equal, round, and reactive to light.  Neck:     Trachea: Trachea and phonation normal.  Pulmonary:     Effort: Pulmonary effort is normal. No respiratory distress.  Chest:     Chest wall: No tenderness.     Comments: No overlying skin changes or evidence of trauma of the upper chest or lateral ribs.  Breast examination deferred. Abdominal:     General: There is no distension.      Palpations: Abdomen is soft.     Tenderness: There is no abdominal tenderness. There is no guarding or rebound.     Comments: No evidence of injury to the abdomen  Musculoskeletal:        General: Normal range of motion.     Cervical back: Normal range of motion.     Comments: No midline C/T/L spinal tenderness palpation.  No crepitus step-off or deformity of the C/T/L-spine.  No paraspinal C/T/L muscular tenderness palpation.  No overlying skin changes or evidence of injury to the neck or back.  Patient is ambulatory around the room without assistance or difficulty.  Patient demonstrates full ROM of the bilateral hips, knees ankles and feet without pain.  Full ROM of the left arm without pain.  Right shoulder: Normal appearance, no overlying skin changes or deformity, no overlying evidence of trauma.  No tenderness to the cervical spine, scapular spine or clavicle.  No tenderness at the [facility] joint.  No tenderness of the right trapezius muscle or paraspinal or parascapular muscles.  No tenderness along the pectoralis.  Patient is mildly tender to the anterior deltoid.  No tenderness to the lateral deltoid.  No tenderness of the biceps or triceps.  No tenderness of the elbow forearm wrist or hand.  Right shoulder flexion AROM 0-45 degrees with increased pain, PROM intact to 90 degrees.  Right shoulder abduction AROM 0-45 degrees, PROM intact to 90 degrees.  Right shoulder extension AROM -30 degrees.  Right shoulder external rotation AROM 40 degrees with increased pain and noted weakness.  Right shoulder internal rotation AROM to abdomen.  Full at the range of motion of the right elbow with flexion extension supination and pronation without pain.  Sensation intact and equal in all distributions upper extremities.  Strong equal radial pulse.  Compartments soft.  Skin:    General: Skin is warm and dry.  Neurological:     Mental Status: She is alert.     GCS: GCS eye subscore is 4. GCS verbal subscore is 5.  GCS motor subscore is 6.     Comments: Speech is clear and goal oriented, follows commands Major Cranial nerves without deficit, no facial droop Moves extremities without ataxia, coordination intact  Psychiatric:        Behavior: Behavior normal.     ED Results / Procedures / Treatments   Labs (all labs ordered are listed, but only abnormal results are displayed) Labs Reviewed - No data to display  EKG None  Radiology DG Shoulder Right  Result Date: 04/12/2022 CLINICAL DATA:  Fall yesterday.  Right shoulder pain. EXAM: RIGHT SHOULDER - 2+ VIEW COMPARISON:  None Available. FINDINGS: There is no evidence of fracture or dislocation. There is  no evidence of arthropathy or other focal bone abnormality. Soft tissues are unremarkable. IMPRESSION: Negative. Electronically Signed   By: Lajean Manes M.D.   On: 04/12/2022 09:31    Procedures Procedures    Medications Ordered in ED Medications - No data to display  ED Course/ Medical Decision Making/ A&P Clinical Course as of 04/12/22 0956  Sat Apr 12, 2022  0928 DG Shoulder Right I have ordered and personally reviewed and interpreted three-view x-ray of the patient's right shoulder.  I do not appreciate any obvious acute fracture or dislocation on those films.  Patient has osteoarthritic changes.  When compared to film on 04/21/2020, no appreciable change found. [BM]    Clinical Course User Index [BM] Gari Crown                           Medical Decision Making 66 year old female with fall directly onto her right shoulder yesterday.  She denies any injury of the head neck back chest abdomen or any additional injuries today.  On examination she is neurovascular intact there is no deformity or crepitus.  Patient has limited active range of motion of the right shoulder suspected due to rotator cuff tear.  Will order right shoulder x-ray to rule out fracture.  Passive range of motion intact, no evidence for dislocation on  exam.  Differential at this time includes but not limited to fracture, dislocation, rotator cuff tear, labral tear, muscle strain.  Amount and/or Complexity of Data Reviewed External Data Reviewed: notes.    Details: Reviewed primary care doctor office visit 04/07/2022.  Diagnosis chronic right shoulder pain, they feel is consistent with rotator cuff tendinitis and prescribed a Medrol Dosepak.  During that exam ROM intact above shoulder level, pain with external rotation extension and abduction. Radiology: ordered and independent interpretation performed. Decision-making details documented in ED Course.  Risk OTC drugs. Risk Details: X-rays today are reassuring no evidence for acute fracture or dislocation.  Patient was updated on x-ray findings today she stated understanding.  There is concern for rotator cuff injury, she was referred to on-call orthopedist Dr. Lynann Bologna for follow-up.  I encouraged her to call the office today to schedule follow-up appointment for early next week.  Patient was given sling today for support, advised nonweightbearing until follow-up with orthopedic specialist.  Rice therapy and OTC anti-inflammatories were discussed.  On reassessment patient is well-appearing and in no acute distress she denies any additional injuries or concerns today.  Of note patient has a reassuring neurologic examination today, full range of motion of the cervical spine and no tenderness of the neck or back to his chest injury of those areas.  No indication for CT imaging or further work-up at this point.  Strict ER precautions were discussed.      At this time there does not appear to be any evidence of an acute emergency medical condition and the patient appears stable for discharge with appropriate outpatient follow up. Diagnosis was discussed with patient who verbalizes understanding of care plan and is agreeable to discharge. I have discussed return precautions with patient who verbalizes  understanding. Patient encouraged to follow-up with their PCP and orthopedist. All questions answered.  Patient's case discussed with Dr. Billy Fischer who agrees with plan to discharge with follow-up.   Note: Portions of this report may have been transcribed using voice recognition software. Every effort was made to ensure accuracy; however, inadvertent computerized transcription errors may still be  present.         Final Clinical Impression(s) / ED Diagnoses Final diagnoses:  Fall, initial encounter  Acute pain of right shoulder  Elevated blood pressure reading    Rx / DC Orders ED Discharge Orders     None         Gari Crown 04/12/22 0735    Gareth Morgan, MD 04/13/22 2240

## 2022-04-14 DIAGNOSIS — M25511 Pain in right shoulder: Secondary | ICD-10-CM | POA: Diagnosis not present

## 2022-04-14 NOTE — Telephone Encounter (Signed)
Referral placed.

## 2022-04-14 NOTE — Telephone Encounter (Signed)
Yes I agree. Please place the referral

## 2022-04-15 DIAGNOSIS — M25611 Stiffness of right shoulder, not elsewhere classified: Secondary | ICD-10-CM | POA: Diagnosis not present

## 2022-04-17 DIAGNOSIS — M25611 Stiffness of right shoulder, not elsewhere classified: Secondary | ICD-10-CM | POA: Diagnosis not present

## 2022-04-30 ENCOUNTER — Other Ambulatory Visit: Payer: Self-pay | Admitting: Internal Medicine

## 2022-04-30 DIAGNOSIS — M25611 Stiffness of right shoulder, not elsewhere classified: Secondary | ICD-10-CM | POA: Diagnosis not present

## 2022-04-30 DIAGNOSIS — F411 Generalized anxiety disorder: Secondary | ICD-10-CM

## 2022-05-17 ENCOUNTER — Other Ambulatory Visit: Payer: Self-pay | Admitting: Gastroenterology

## 2022-05-17 DIAGNOSIS — M25511 Pain in right shoulder: Secondary | ICD-10-CM | POA: Diagnosis not present

## 2022-05-18 MED ORDER — OMEPRAZOLE 20 MG PO CPDR
20.0000 mg | DELAYED_RELEASE_CAPSULE | Freq: Two times a day (BID) | ORAL | 0 refills | Status: DC
  Filled 2022-05-18: qty 90, 45d supply, fill #0

## 2022-05-19 ENCOUNTER — Other Ambulatory Visit (HOSPITAL_COMMUNITY): Payer: Self-pay

## 2022-05-20 ENCOUNTER — Other Ambulatory Visit (HOSPITAL_COMMUNITY): Payer: Self-pay

## 2022-05-28 ENCOUNTER — Other Ambulatory Visit: Payer: Self-pay | Admitting: Internal Medicine

## 2022-05-28 DIAGNOSIS — F411 Generalized anxiety disorder: Secondary | ICD-10-CM

## 2022-05-30 DIAGNOSIS — Z716 Tobacco abuse counseling: Secondary | ICD-10-CM | POA: Diagnosis not present

## 2022-05-30 DIAGNOSIS — F17210 Nicotine dependence, cigarettes, uncomplicated: Secondary | ICD-10-CM | POA: Diagnosis not present

## 2022-05-30 DIAGNOSIS — M75121 Complete rotator cuff tear or rupture of right shoulder, not specified as traumatic: Secondary | ICD-10-CM | POA: Diagnosis not present

## 2022-06-12 DIAGNOSIS — S46011A Strain of muscle(s) and tendon(s) of the rotator cuff of right shoulder, initial encounter: Secondary | ICD-10-CM | POA: Diagnosis not present

## 2022-06-12 DIAGNOSIS — M24111 Other articular cartilage disorders, right shoulder: Secondary | ICD-10-CM | POA: Diagnosis not present

## 2022-06-12 DIAGNOSIS — G8918 Other acute postprocedural pain: Secondary | ICD-10-CM | POA: Diagnosis not present

## 2022-06-12 DIAGNOSIS — M7541 Impingement syndrome of right shoulder: Secondary | ICD-10-CM | POA: Diagnosis not present

## 2022-06-23 DIAGNOSIS — Z9889 Other specified postprocedural states: Secondary | ICD-10-CM | POA: Diagnosis not present

## 2022-06-30 DIAGNOSIS — M75121 Complete rotator cuff tear or rupture of right shoulder, not specified as traumatic: Secondary | ICD-10-CM | POA: Diagnosis not present

## 2022-07-02 DIAGNOSIS — M75121 Complete rotator cuff tear or rupture of right shoulder, not specified as traumatic: Secondary | ICD-10-CM | POA: Diagnosis not present

## 2022-07-04 DIAGNOSIS — M75121 Complete rotator cuff tear or rupture of right shoulder, not specified as traumatic: Secondary | ICD-10-CM | POA: Diagnosis not present

## 2022-07-07 DIAGNOSIS — M75121 Complete rotator cuff tear or rupture of right shoulder, not specified as traumatic: Secondary | ICD-10-CM | POA: Diagnosis not present

## 2022-07-14 DIAGNOSIS — M75121 Complete rotator cuff tear or rupture of right shoulder, not specified as traumatic: Secondary | ICD-10-CM | POA: Diagnosis not present

## 2022-07-16 DIAGNOSIS — M75121 Complete rotator cuff tear or rupture of right shoulder, not specified as traumatic: Secondary | ICD-10-CM | POA: Diagnosis not present

## 2022-07-17 ENCOUNTER — Other Ambulatory Visit: Payer: Self-pay | Admitting: Internal Medicine

## 2022-07-17 DIAGNOSIS — F330 Major depressive disorder, recurrent, mild: Secondary | ICD-10-CM

## 2022-07-22 DIAGNOSIS — M75121 Complete rotator cuff tear or rupture of right shoulder, not specified as traumatic: Secondary | ICD-10-CM | POA: Diagnosis not present

## 2022-07-24 DIAGNOSIS — M75121 Complete rotator cuff tear or rupture of right shoulder, not specified as traumatic: Secondary | ICD-10-CM | POA: Diagnosis not present

## 2022-08-04 ENCOUNTER — Other Ambulatory Visit: Payer: Self-pay | Admitting: Internal Medicine

## 2022-08-04 DIAGNOSIS — F330 Major depressive disorder, recurrent, mild: Secondary | ICD-10-CM

## 2022-08-05 DIAGNOSIS — M75121 Complete rotator cuff tear or rupture of right shoulder, not specified as traumatic: Secondary | ICD-10-CM | POA: Diagnosis not present

## 2022-08-11 DIAGNOSIS — M75121 Complete rotator cuff tear or rupture of right shoulder, not specified as traumatic: Secondary | ICD-10-CM | POA: Diagnosis not present

## 2022-08-14 DIAGNOSIS — M75121 Complete rotator cuff tear or rupture of right shoulder, not specified as traumatic: Secondary | ICD-10-CM | POA: Diagnosis not present

## 2022-10-06 ENCOUNTER — Other Ambulatory Visit: Payer: Self-pay | Admitting: Internal Medicine

## 2022-10-06 DIAGNOSIS — F411 Generalized anxiety disorder: Secondary | ICD-10-CM

## 2022-10-14 ENCOUNTER — Other Ambulatory Visit: Payer: Self-pay | Admitting: Internal Medicine

## 2022-10-14 DIAGNOSIS — F330 Major depressive disorder, recurrent, mild: Secondary | ICD-10-CM

## 2022-10-17 ENCOUNTER — Other Ambulatory Visit: Payer: Self-pay | Admitting: Gastroenterology

## 2022-10-17 ENCOUNTER — Other Ambulatory Visit (HOSPITAL_COMMUNITY): Payer: Self-pay

## 2022-10-19 ENCOUNTER — Other Ambulatory Visit: Payer: Self-pay | Admitting: Gastroenterology

## 2022-10-20 ENCOUNTER — Other Ambulatory Visit (HOSPITAL_COMMUNITY): Payer: Self-pay

## 2022-10-20 MED ORDER — OMEPRAZOLE 20 MG PO CPDR
20.0000 mg | DELAYED_RELEASE_CAPSULE | Freq: Every day | ORAL | 0 refills | Status: DC
  Filled 2022-10-20: qty 90, 90d supply, fill #0

## 2022-10-30 ENCOUNTER — Other Ambulatory Visit: Payer: Self-pay | Admitting: Internal Medicine

## 2022-10-30 DIAGNOSIS — F411 Generalized anxiety disorder: Secondary | ICD-10-CM

## 2022-11-03 ENCOUNTER — Encounter: Payer: Self-pay | Admitting: Family Medicine

## 2022-11-03 ENCOUNTER — Ambulatory Visit: Payer: BC Managed Care – PPO | Admitting: Family Medicine

## 2022-11-03 ENCOUNTER — Telehealth: Payer: Self-pay | Admitting: Internal Medicine

## 2022-11-03 VITALS — BP 118/78 | HR 75 | Temp 98.0°F | Wt 129.2 lb

## 2022-11-03 DIAGNOSIS — M25521 Pain in right elbow: Secondary | ICD-10-CM | POA: Diagnosis not present

## 2022-11-03 NOTE — Telephone Encounter (Signed)
Pt stated that Dr. Sarajane Jews agreed to be her PCP; just needing a "okay" from Dr. Jerilee Hoh.   Please advise.

## 2022-11-03 NOTE — Progress Notes (Signed)
   Subjective:    Patient ID: [name], female    DOB: Jun 16, 1956, 67 y.o.   MRN: [id number]  HPI Here for 2 weeks of intermittent sharp pains in the right elbow. No recent trauma. No swelling or warmth. She feels the pain at the tip of the elbow, and it only happens when she rests her elbow on a hard surface, such as the arm of a chair. The pain is intense but only lasts a few seconds.    Review of Systems  Constitutional: Negative.   Respiratory: Negative.    Cardiovascular: Negative.   Musculoskeletal:  Positive for arthralgias.       Objective:   Physical Exam Constitutional:      General: She is not in acute distress.    Appearance: Normal appearance.  Cardiovascular:     Rate and Rhythm: Normal rate and regular rhythm.     Pulses: Normal pulses.     Heart sounds: Normal heart sounds.  Pulmonary:     Effort: Pulmonary effort is normal.     Breath sounds: Normal breath sounds.  Musculoskeletal:     Comments: The right elbow appears normal with no swelling or erythema or warmth. There is no tenderness and I cannot reproduce the pain she is describing   Neurological:     Mental Status: She is alert.           Assessment & Plan:  Right elbow pain which sounds like it involves the ulnar nerve. She can apply ice as needed. We will refer her to her orthopedist, Dr. Malena Catholic.  Alysia Penna, MD

## 2022-11-06 ENCOUNTER — Other Ambulatory Visit: Payer: Self-pay

## 2022-11-10 DIAGNOSIS — M25521 Pain in right elbow: Secondary | ICD-10-CM | POA: Diagnosis not present

## 2022-11-12 ENCOUNTER — Telehealth: Payer: Self-pay | Admitting: Internal Medicine

## 2022-11-12 NOTE — Telephone Encounter (Signed)
LVM to sched a TOC appt with Dr. Sarajane Jews.   FYI

## 2022-11-13 NOTE — Telephone Encounter (Signed)
Noted  

## 2022-12-08 ENCOUNTER — Encounter: Payer: Self-pay | Admitting: *Deleted

## 2022-12-22 ENCOUNTER — Encounter: Payer: Self-pay | Admitting: Family Medicine

## 2022-12-22 ENCOUNTER — Ambulatory Visit: Payer: BC Managed Care – PPO | Admitting: Family Medicine

## 2022-12-22 VITALS — BP 124/86 | HR 80 | Temp 97.8°F | Ht 65.0 in | Wt 125.2 lb

## 2022-12-22 DIAGNOSIS — K5100 Ulcerative (chronic) pancolitis without complications: Secondary | ICD-10-CM

## 2022-12-22 DIAGNOSIS — F418 Other specified anxiety disorders: Secondary | ICD-10-CM

## 2022-12-22 DIAGNOSIS — F330 Major depressive disorder, recurrent, mild: Secondary | ICD-10-CM | POA: Diagnosis not present

## 2022-12-22 DIAGNOSIS — G4700 Insomnia, unspecified: Secondary | ICD-10-CM | POA: Diagnosis not present

## 2022-12-22 DIAGNOSIS — K2100 Gastro-esophageal reflux disease with esophagitis, without bleeding: Secondary | ICD-10-CM

## 2022-12-22 DIAGNOSIS — K219 Gastro-esophageal reflux disease without esophagitis: Secondary | ICD-10-CM | POA: Insufficient documentation

## 2022-12-22 MED ORDER — ALPRAZOLAM 0.5 MG PO TABS: 0.5000 mg | ORAL_TABLET | Freq: Three times a day (TID) | ORAL | 2 refills | Status: DC | PRN

## 2022-12-22 MED ORDER — BUPROPION HCL ER (XL) 300 MG PO TB24: 300.0000 mg | ORAL_TABLET | Freq: Every morning | ORAL | 3 refills | Status: DC

## 2022-12-22 MED ORDER — SERTRALINE HCL 50 MG PO TABS: 50.0000 mg | ORAL_TABLET | Freq: Every day | ORAL | 3 refills | Status: DC

## 2022-12-22 NOTE — Progress Notes (Signed)
   Subjective:    Patient ID: [name], female    DOB: May 07, 1956, 66 y.o.   MRN: [id number]  HPI Here to transfer primary care to [place] from Dr. Ardyth Harps. Her main concern today is her high stress levels. She sells insurance for a living, and her job is very stressful because she has a large number of clients. She has also been dealing with family issues, including her sister's stroke 2 weeks ago. She has been taking Wellbutrin XL 300 mg daily for years, but she has never tried anything else apparently. She does take a Xanax now and then, but she has to take 2 at a time. She does not sleep well, and when she does sleep she often has very disturbing dreams. She sees Dr. Chales Abrahams for GI care. She has GERD and colitis, and she had upper and lower endoscopies last year. She has not had a physical for several years. Also when we met on 11-03-22 for right elbow pain, she was directed to see her orthopedist, Dr. Ave Filter. He gave her Voltaren gel to apply to the area, and the pain totally resolved.    Review of Systems  Constitutional: Negative.   Respiratory: Negative.    Cardiovascular: Negative.   Gastrointestinal:  Positive for abdominal pain and nausea. Negative for abdominal distention, anal bleeding, blood in stool, constipation, diarrhea, rectal pain and vomiting.  Genitourinary: Negative.   Neurological: Negative.   Psychiatric/Behavioral:  Positive for decreased concentration, dysphoric mood and sleep disturbance. Negative for agitation, behavioral problems, confusion, hallucinations, self-injury and suicidal ideas. The patient is nervous/anxious.        Objective:   Physical Exam Constitutional:      Appearance: Normal appearance.  Cardiovascular:     Rate and Rhythm: Normal rate and regular rhythm.     Pulses: Normal pulses.     Heart sounds: Normal heart sounds.  Pulmonary:     Effort: Pulmonary effort is normal.     Breath sounds: Normal breath sounds.  Neurological:      General: No focal deficit present.     Mental Status: She is alert and oriented to person, place, and time.  Psychiatric:        Behavior: Behavior normal.        Thought Content: Thought content normal.     Comments: Somewhat anxious            Assessment & Plan:  Intro visit for this patient whose main concern is her anxiety and depression. We will add Zoloft 50 mg daily to the Wellbutrin, and we will increase the Xanax to 0.5 mg as needed. She will arrange to return in 4 weeks for a complete physical, and we will follow up on the anxiety and depression at that time. We spent a total of ( 34  ) minutes reviewing records and discussing these issues.  Gershon Crane, MD

## 2023-01-14 ENCOUNTER — Encounter: Payer: BC Managed Care – PPO | Admitting: Family Medicine

## 2023-01-26 ENCOUNTER — Encounter: Payer: BC Managed Care – PPO | Admitting: Family Medicine

## 2023-02-06 ENCOUNTER — Encounter: Payer: Self-pay | Admitting: Family Medicine

## 2023-02-06 ENCOUNTER — Other Ambulatory Visit: Payer: Self-pay

## 2023-02-06 ENCOUNTER — Other Ambulatory Visit (HOSPITAL_COMMUNITY): Payer: Self-pay

## 2023-02-06 ENCOUNTER — Ambulatory Visit (INDEPENDENT_AMBULATORY_CARE_PROVIDER_SITE_OTHER): Payer: BC Managed Care – PPO | Admitting: Family Medicine

## 2023-02-06 ENCOUNTER — Other Ambulatory Visit: Payer: Self-pay | Admitting: Gastroenterology

## 2023-02-06 VITALS — BP 126/78 | HR 68 | Temp 98.5°F | Ht 65.0 in | Wt 126.6 lb

## 2023-02-06 DIAGNOSIS — Z0000 Encounter for general adult medical examination without abnormal findings: Secondary | ICD-10-CM | POA: Diagnosis not present

## 2023-02-06 DIAGNOSIS — Z23 Encounter for immunization: Secondary | ICD-10-CM | POA: Diagnosis not present

## 2023-02-06 DIAGNOSIS — Z13220 Encounter for screening for lipoid disorders: Secondary | ICD-10-CM | POA: Diagnosis not present

## 2023-02-06 LAB — BASIC METABOLIC PANEL
BUN: 20 mg/dL (ref 6–23)
CO2: 27 mEq/L (ref 19–32)
Calcium: 9.3 mg/dL (ref 8.4–10.5)
Chloride: 106 mEq/L (ref 96–112)
Creatinine, Ser: 0.74 mg/dL (ref 0.40–1.20)
GFR: 84.29 mL/min (ref >60.00)
Glucose, Bld: 86 mg/dL (ref 70–99)
Potassium: 4.7 mEq/L (ref 3.5–5.1)
Sodium: 141 mEq/L (ref 135–145)

## 2023-02-06 LAB — CBC WITH DIFFERENTIAL/PLATELET
Basophils Absolute: 0.1 10*3/uL (ref 0.0–0.1)
Basophils Relative: 0.8 % (ref 0.0–3.0)
Eosinophils Absolute: 0.1 10*3/uL (ref 0.0–0.7)
Eosinophils Relative: 0.9 % (ref 0.0–5.0)
HCT: 39.5 % (ref 36.0–46.0)
Hemoglobin: 13.2 g/dL (ref 12.0–15.0)
Lymphocytes Relative: 25.7 % (ref 12.0–46.0)
Lymphs Abs: 1.9 10*3/uL (ref 0.7–4.0)
MCHC: 33.5 g/dL (ref 30.0–36.0)
MCV: 89.6 fl (ref 78.0–100.0)
Monocytes Absolute: 0.6 10*3/uL (ref 0.1–1.0)
Monocytes Relative: 8.6 % (ref 3.0–12.0)
Neutro Abs: 4.6 10*3/uL (ref 1.4–7.7)
Neutrophils Relative %: 64 % (ref 43.0–77.0)
Platelets: 202 10*3/uL (ref 150.0–400.0)
RBC: 4.41 Mil/uL (ref 3.87–5.11)
RDW: 13.7 % (ref 11.5–15.5)
WBC: 7.2 10*3/uL (ref 4.0–10.5)

## 2023-02-06 LAB — HEPATIC FUNCTION PANEL
ALT: 12 U/L (ref 0–35)
AST: 18 U/L (ref 0–37)
Albumin: 4.3 g/dL (ref 3.5–5.2)
Alkaline Phosphatase: 63 U/L (ref 39–117)
Bilirubin, Direct: 0.1 mg/dL (ref 0.0–0.3)
Total Bilirubin: 0.7 mg/dL (ref 0.2–1.2)
Total Protein: 6.9 g/dL (ref 6.0–8.3)

## 2023-02-06 LAB — TSH: TSH: 0.96 u[IU]/mL (ref 0.35–5.50)

## 2023-02-06 LAB — HEMOGLOBIN A1C: Hgb A1c MFr Bld: 5.7 % (ref 4.6–6.5)

## 2023-02-06 LAB — LIPID PANEL
Cholesterol: 214 mg/dL — ABNORMAL HIGH (ref 0–200)
HDL: 62.4 mg/dL (ref >39.00)
LDL Cholesterol: 138 mg/dL — ABNORMAL HIGH (ref 0–99)
NonHDL: 151.55
Total CHOL/HDL Ratio: 3
Triglycerides: 70 mg/dL (ref 0.0–149.0)
VLDL: 14 mg/dL (ref 0.0–40.0)

## 2023-02-06 LAB — VITAMIN B12: Vitamin B-12: 272 pg/mL (ref 211–911)

## 2023-02-06 MED ORDER — OMEPRAZOLE 20 MG PO CPDR
20.0000 mg | DELAYED_RELEASE_CAPSULE | Freq: Every day | ORAL | 0 refills | Status: DC
  Filled 2023-02-06: qty 30, 30d supply, fill #0
  Filled 2023-03-18: qty 30, 30d supply, fill #1
  Filled 2023-06-11: qty 30, 30d supply, fill #2

## 2023-02-06 MED ORDER — SERTRALINE HCL 100 MG PO TABS: 100.0000 mg | ORAL_TABLET | Freq: Every day | ORAL | 5 refills | Status: DC

## 2023-02-06 NOTE — Progress Notes (Signed)
Subjective:    Patient ID: [name], female    DOB: May 05, 1956, 67 y.o.   MRN: [id number]  HPI Here for a well exam. We saw her recently for anxiety and depression, and she related that she has a high stress job. We added Zoloft 50 mg daily to her Wellbutrin, and she says this has helped her sleep better a night. However her anxiety during the day has not changed. Physically her only complaint is generalized fatigue. She used to exercise regularly but she stopped several years ago. She has not had a mammogram for several years. She sees Dr. Chales Abrahams for GI care.    Review of Systems  Constitutional:  Positive for fatigue.  HENT: Negative.    Eyes: Negative.   Respiratory: Negative.    Cardiovascular: Negative.   Gastrointestinal: Negative.   Genitourinary:  Negative for decreased urine volume, difficulty urinating, dyspareunia, dysuria, enuresis, flank pain, frequency, hematuria, pelvic pain and urgency.  Musculoskeletal: Negative.   Skin: Negative.   Neurological: Negative.  Negative for headaches.  Psychiatric/Behavioral:  Positive for decreased concentration and dysphoric mood. Negative for agitation, behavioral problems, confusion, hallucinations, self-injury, sleep disturbance and suicidal ideas. The patient is nervous/anxious.        Objective:   Physical Exam Constitutional:      General: She is not in acute distress.    Appearance: Normal appearance. She is well-developed.  HENT:     Head: Normocephalic and atraumatic.     Right Ear: External ear normal.     Left Ear: External ear normal.     Nose: Nose normal.     Mouth/Throat:     Pharynx: No oropharyngeal exudate.  Eyes:     General: No scleral icterus.    Conjunctiva/sclera: Conjunctivae normal.     Pupils: Pupils are equal, round, and reactive to light.  Neck:     Thyroid: No thyromegaly.     Vascular: No JVD.  Cardiovascular:     Rate and Rhythm: Normal rate and regular rhythm.     Pulses: Normal pulses.      Heart sounds: Normal heart sounds. No murmur heard.    No friction rub. No gallop.  Pulmonary:     Effort: Pulmonary effort is normal. No respiratory distress.     Breath sounds: Normal breath sounds. No wheezing or rales.  Chest:     Chest wall: No tenderness.  Abdominal:     General: Bowel sounds are normal. There is no distension.     Palpations: Abdomen is soft. There is no mass.     Tenderness: There is no abdominal tenderness. There is no guarding or rebound.  Musculoskeletal:        General: No tenderness. Normal range of motion.     Cervical back: Normal range of motion and neck supple.  Lymphadenopathy:     Cervical: No cervical adenopathy.  Skin:    General: Skin is warm and dry.     Findings: No erythema or rash.  Neurological:     General: No focal deficit present.     Mental Status: She is alert and oriented to person, place, and time.     Cranial Nerves: No cranial nerve deficit.     Motor: No abnormal muscle tone.     Coordination: Coordination normal.     Deep Tendon Reflexes: Reflexes are normal and symmetric. Reflexes normal.  Psychiatric:        Behavior: Behavior normal.  Thought Content: Thought content normal.        Judgment: Judgment normal.     Comments: She is a bit anxious            Assessment & Plan:  Well exam. We discussed diet and exercise. Get fasting labs. For the anxiety and depression, we will increase the Zoloft to 100 mg daily. I again encouraged her to see a therapist, and I gave her contact information for [facility]. Gershon Crane, MD

## 2023-02-09 ENCOUNTER — Other Ambulatory Visit: Payer: Self-pay

## 2023-02-09 ENCOUNTER — Encounter: Payer: Self-pay | Admitting: Pharmacist

## 2023-02-10 ENCOUNTER — Other Ambulatory Visit (HOSPITAL_COMMUNITY): Payer: Self-pay

## 2023-02-12 MED ORDER — NICOTINE 14 MG/24HR TD PT24: 14.0000 mg | MEDICATED_PATCH | Freq: Every day | TRANSDERMAL | 11 refills | Status: DC

## 2023-02-12 NOTE — Telephone Encounter (Signed)
-----   Message from Sherrin Daisy, [place] sent at 02/10/2023  5:22 PM EDT ----- Patient notified of results and verbalized understanding. Pt is requesting nicotine patches. Pt stated she has not talked to Dr.Roberto Romanoski about this.

## 2023-02-12 NOTE — Telephone Encounter (Signed)
Done

## 2023-02-21 ENCOUNTER — Other Ambulatory Visit: Payer: Self-pay | Admitting: Internal Medicine

## 2023-02-21 DIAGNOSIS — F330 Major depressive disorder, recurrent, mild: Secondary | ICD-10-CM

## 2023-03-02 ENCOUNTER — Telehealth: Payer: Self-pay | Admitting: Family Medicine

## 2023-03-02 ENCOUNTER — Telehealth: Payer: Self-pay | Admitting: Gastroenterology

## 2023-03-02 MED ORDER — NICOTINE 14 MG/24HR TD PT24: 14.0000 mg | MEDICATED_PATCH | Freq: Every day | TRANSDERMAL | 11 refills | Status: DC

## 2023-03-02 MED ORDER — MESALAMINE 1.2 G PO TBEC: 2.4000 g | DELAYED_RELEASE_TABLET | Freq: Every day | ORAL | 1 refills | Status: DC

## 2023-03-02 NOTE — Telephone Encounter (Signed)
Are you willing to do zofran refill for this patient?  I sent Lialda in

## 2023-03-02 NOTE — Telephone Encounter (Signed)
Patient needing medication refill on mesalamine and ondanetran. Please advise.

## 2023-03-02 NOTE — Addendum Note (Signed)
Addended by: Kathreen Devoid on: 03/02/2023 05:09 PM   Modules accepted: Orders

## 2023-03-02 NOTE — Telephone Encounter (Signed)
Requesting nicotine (NICODERM CQ) 14 mg/24hr patch be rerouted to [id number] - [place], [place] - [address] AT [facility] OF [address] Phone: [phone]  Fax: [phone]

## 2023-03-06 MED ORDER — ONDANSETRON 4 MG PO TBDP: 4.0000 mg | ORAL_TABLET | Freq: Three times a day (TID) | ORAL | 2 refills | Status: DC | PRN

## 2023-03-06 NOTE — Telephone Encounter (Signed)
Please fill in Zofran 4 mg ODT every 8 hours as needed, 20, 2RF RG

## 2023-03-06 NOTE — Telephone Encounter (Signed)
Done

## 2023-03-09 ENCOUNTER — Telehealth: Payer: Self-pay | Admitting: Family Medicine

## 2023-03-09 NOTE — Telephone Encounter (Signed)
Pt and stated she had a refill on  nicotine (NICODERM CQ) 14 mg/24hr patch stated she got 5 and 6 but didn't get 1234 she stated the first refill was sent to the wrong pharmacy.

## 2023-03-10 NOTE — Telephone Encounter (Signed)
Pt is returning nancy call 

## 2023-03-10 NOTE — Telephone Encounter (Signed)
Rather than go back to patches 1 through 4, please call a RX for all the remaining patches to her new pharmacy

## 2023-03-10 NOTE — Telephone Encounter (Signed)
Left a message for pt advised to call the office back regarding message below

## 2023-03-10 NOTE — Telephone Encounter (Signed)
Spoke with pt state that her Patches 1 though 4 was sent to [facility] in [place]. Pt states that she has the rest of the patches at the pharmacy but request for Rx that was [facility] to [facility] [place] to go to [facility] in [place]

## 2023-03-11 MED ORDER — NICOTINE 14 MG/24HR TD PT24: 14.0000 mg | MEDICATED_PATCH | Freq: Every day | TRANSDERMAL | 11 refills | Status: DC

## 2023-03-11 NOTE — Telephone Encounter (Signed)
Rx done. 

## 2023-03-18 ENCOUNTER — Other Ambulatory Visit: Payer: Self-pay

## 2023-03-18 ENCOUNTER — Other Ambulatory Visit (HOSPITAL_COMMUNITY): Payer: Self-pay

## 2023-03-18 MED ORDER — NICOTINE 14 MG/24HR TD PT24: 14.0000 mg | MEDICATED_PATCH | Freq: Every day | TRANSDERMAL | 11 refills | Status: DC

## 2023-03-18 NOTE — Telephone Encounter (Signed)
Rx for Patches sent to requested pharmacy

## 2023-03-18 NOTE — Telephone Encounter (Signed)
Rx was sent to wrong pharm please resend to  [id number] - [place], [place] - [address] AT [facility] OF [address] Phone: [phone]  Fax: [phone]

## 2023-06-11 ENCOUNTER — Other Ambulatory Visit: Payer: Self-pay

## 2023-07-18 ENCOUNTER — Other Ambulatory Visit: Payer: Self-pay | Admitting: Family Medicine

## 2023-07-20 NOTE — Telephone Encounter (Signed)
Pt has been out of med for 4 days

## 2023-07-20 NOTE — Telephone Encounter (Signed)
Pt states she is shaking and not doing well. Has been out of these meds for 4 days.  Please, please send refill ASAP!!  [facility] #[id number] - , [place] - [address] AT [facility] OF [address] Phone: [phone]  Fax: [phone]

## 2023-07-21 NOTE — Telephone Encounter (Signed)
Pt called to say it is now Day 5 of her being without her medication. Pt is begging MD, please send 90 day supply ASAP!!  [facility] #[id number] - [place], [place] - [address] AT [facility] OF [address] Phone: [phone]  Fax: [phone]

## 2023-07-22 ENCOUNTER — Other Ambulatory Visit: Payer: Self-pay | Admitting: Family Medicine

## 2023-08-10 ENCOUNTER — Ambulatory Visit (INDEPENDENT_AMBULATORY_CARE_PROVIDER_SITE_OTHER): Payer: Medicare HMO | Admitting: Internal Medicine

## 2023-08-10 ENCOUNTER — Encounter: Payer: Self-pay | Admitting: Internal Medicine

## 2023-08-10 VITALS — BP 130/80 | HR 79 | Temp 98.5°F | Wt 138.4 lb

## 2023-08-10 DIAGNOSIS — R059 Cough, unspecified: Secondary | ICD-10-CM

## 2023-08-10 DIAGNOSIS — J069 Acute upper respiratory infection, unspecified: Secondary | ICD-10-CM | POA: Diagnosis not present

## 2023-08-10 DIAGNOSIS — J029 Acute pharyngitis, unspecified: Secondary | ICD-10-CM | POA: Diagnosis not present

## 2023-08-10 LAB — POCT INFLUENZA A/B
Influenza A, POC: NEGATIVE
Influenza B, POC: NEGATIVE

## 2023-08-10 LAB — POCT RAPID STREP A (OFFICE): Rapid Strep A Screen: NEGATIVE

## 2023-08-10 LAB — POC COVID19 BINAXNOW: SARS Coronavirus 2 Ag: NEGATIVE

## 2023-08-10 NOTE — Progress Notes (Signed)
Established Patient Office Visit     CC/Reason for Visit: URI symptoms  HPI: [name] is a 67 y.o. female who is coming in today for the above mentioned reasons.  URI symptoms such as cough, sore throat, runny nose, congestion, postnasal drip have been present for 3 days.  Many recent sick contacts.   Past Medical/Surgical History: Past Medical History:  Diagnosis Date   Anxiety    Clostridioides difficile infection    Depression    Migraine    Palpitations    Pancreatitis    Ulcerative colitis [facility])     Past Surgical History:  Procedure Laterality Date   APPENDECTOMY Right    BIOPSY  08/18/2019   Procedure: BIOPSY;  Surgeon: Lynann Bologna, MD;  Location: [facility];  Service: Endoscopy;;   COLONOSCOPY N/A 08/18/2019   Procedure: COLONOSCOPY;  Surgeon: Lynann Bologna, MD;  Location: [facility];  Service: Endoscopy;  Laterality: N/A;   COLONOSCOPY  12/12/2021   per Dr. Chales Abrahams, no polyps, repeat in 5 yrs   ESOPHAGOGASTRODUODENOSCOPY N/A 08/18/2019   per Dr. Chales Abrahams, showed GERD, neg for H pylori   POLYPECTOMY  08/18/2019   Procedure: POLYPECTOMY;  Surgeon: Lynann Bologna, MD;  Location: [facility];  Service: Endoscopy;;   TONSILLECTOMY      Social History:  reports that she has been smoking cigarettes. She has never used smokeless tobacco. She reports current alcohol use. She reports current drug use. Drug: Marijuana.  Allergies: No Known Allergies  Family History:  Family History  Problem Relation Age of Onset   Prostate cancer Father    Hypertension Mother    Liver disease Brother        alcohol related   Liver disease Brother        alcohol related   Colon cancer Neg Hx    Esophageal cancer Neg Hx    Stomach cancer Neg Hx    Pancreatic cancer Neg Hx      Current Outpatient Medications:    ALPRAZolam (XANAX) 0.5 MG tablet, TAKE 1 TABLET(0.5 MG) BY MOUTH THREE TIMES DAILY AS NEEDED FOR ANXIETY OR SLEEP, Disp: 90 tablet, Rfl: 5   buPROPion  (WELLBUTRIN XL) 300 MG 24 hr tablet, TAKE 1 TABLET BY MOUTH EVERY DAY, Disp: 90 tablet, Rfl: 0   calcium-vitamin D 250-100 MG-UNIT tablet, Take 1 tablet by mouth 2 (two) times daily., Disp: , Rfl:    mesalamine (LIALDA) 1.2 g EC tablet, Take 2 tablets (2.4 g total) by mouth daily with breakfast., Disp: 180 tablet, Rfl: 1   Multiple Vitamin (MULTIVITAMIN) tablet, Take 1 tablet by mouth daily., Disp: , Rfl:    nicotine (NICODERM CQ) 14 mg/24hr patch, Place 1 patch (14 mg total) onto the skin daily., Disp: 28 patch, Rfl: 11   omeprazole (PRILOSEC) 20 MG capsule, Take 1 capsule (20 mg total) by mouth daily., Disp: 90 capsule, Rfl: 0   ondansetron (ZOFRAN-ODT) 4 MG disintegrating tablet, Take 1 tablet (4 mg total) by mouth every 8 (eight) hours as needed for nausea or vomiting., Disp: 20 tablet, Rfl: 2   sertraline (ZOLOFT) 100 MG tablet, Take 1 tablet (100 mg total) by mouth daily., Disp: 30 tablet, Rfl: 5  Review of Systems:  Negative unless indicated in HPI.   Physical Exam: Vitals:   08/10/23 1137  BP: 130/80  Pulse: 79  Temp: 98.5 F (36.9 C)  TempSrc: Oral  Weight: 138 lb 6.4 oz (62.8 kg)    Body mass index is 23.03  kg/m.   Physical Exam Vitals reviewed.  Constitutional:      Appearance: Normal appearance.  HENT:     Right Ear: Ear canal and external ear normal. A middle ear effusion is present.     Left Ear: Ear canal and external ear normal. A middle ear effusion is present.     Nose: Congestion and rhinorrhea present.     Mouth/Throat:     Mouth: Mucous membranes are moist.     Pharynx: Posterior oropharyngeal erythema present.  Eyes:     Conjunctiva/sclera: Conjunctivae normal.     Pupils: Pupils are equal, round, and reactive to light.  Cardiovascular:     Rate and Rhythm: Normal rate and regular rhythm.  Pulmonary:     Effort: Pulmonary effort is normal.     Breath sounds: Normal breath sounds.  Neurological:     Mental Status: She is alert.      Impression  and Plan:  Cough, unspecified type -     POC COVID-19 BinaxNow -     POCT Influenza A/B  Sore throat -     POC COVID-19 BinaxNow -     POCT Influenza A/B -     POCT rapid strep A  Viral upper respiratory tract infection   -In office flu, COVID, strep test are negative. -Given exam findings, PNA, pharyngitis, ear infection are not likely, hence abx have not been prescribed. -Have advised rest, fluids, OTC antihistamines, cough suppressants and mucinex. -RTC if no improvement in 10-14 days.   Time spent:23 minutes reviewing chart, interviewing and examining patient and formulating plan of care.     Chaya Jan, MD  Primary Care at [facility]

## 2023-08-13 ENCOUNTER — Telehealth: Payer: Self-pay | Admitting: *Deleted

## 2023-08-13 NOTE — Telephone Encounter (Signed)
Patient is aware and states that she "will go to urgent care".

## 2023-08-13 NOTE — Telephone Encounter (Signed)
Copied from CRM [id number]. Topic: Clinical - Medical Advice >> Aug 13, 2023  1:03 PM Fonda Kinder J wrote: Reason for CRM: Pt was seen on 12/16 EAV:[id number] Sore Throat Headache. Pt says she is feeling worse and now has fluid build up in her ear, she doesn't want to be seen again unless necessary and wants to know if she was able to be prescribed an antibiotic for this issue. Pt is also requesting a call back to speak with someone from her care team

## 2023-08-24 ENCOUNTER — Other Ambulatory Visit (HOSPITAL_COMMUNITY): Payer: Self-pay

## 2023-08-24 ENCOUNTER — Other Ambulatory Visit: Payer: Self-pay | Admitting: Gastroenterology

## 2023-08-24 ENCOUNTER — Telehealth: Payer: Self-pay

## 2023-08-24 ENCOUNTER — Ambulatory Visit (INDEPENDENT_AMBULATORY_CARE_PROVIDER_SITE_OTHER): Payer: Medicare HMO | Admitting: Family Medicine

## 2023-08-24 VITALS — BP 124/84 | HR 75 | Temp 98.1°F | Wt 137.9 lb

## 2023-08-24 DIAGNOSIS — R051 Acute cough: Secondary | ICD-10-CM | POA: Diagnosis not present

## 2023-08-24 MED ORDER — DOXYCYCLINE HYCLATE 100 MG PO CAPS: 100.0000 mg | ORAL_CAPSULE | Freq: Two times a day (BID) | ORAL | 0 refills | Status: DC

## 2023-08-24 MED ORDER — OMEPRAZOLE 20 MG PO CPDR
20.0000 mg | DELAYED_RELEASE_CAPSULE | Freq: Every day | ORAL | 0 refills | Status: DC
  Filled 2023-08-24: qty 90, 90d supply, fill #0

## 2023-08-24 MED ORDER — BENZONATATE 100 MG PO CAPS
100.0000 mg | ORAL_CAPSULE | Freq: Three times a day (TID) | ORAL | 0 refills | Status: DC | PRN
Start: 2023-08-24 — End: 2023-11-09

## 2023-08-24 NOTE — Patient Instructions (Signed)
Follow up for any fever or increased shortness of breath. 

## 2023-08-24 NOTE — Progress Notes (Signed)
Established Patient Office Visit  Subjective   Patient ID: [name], female    DOB: 1956/08/04  Age: 67 y.o. MRN: [id number]  Chief Complaint  Patient presents with   Cough    Patient complains of cough, x2 weeks, Tried Tylenol sinus and Mucinex, Non productive     HPI   [name] seen as a work in today with 2-week history of cough.  Cough has been mostly dry.  She tried some Mucinex and Tylenol Sinus.  No fever.  Does smoke currently about 7 cigarettes/day.  Her daughter who lives in [place] was here visiting over the holidays and was just diagnosed with RSV.  [name] was not aware of any obvious wheezing.  No shortness of breath. Denies any facial pain or acute sinusitis symptoms.  No hemoptysis.  Past Medical History:  Diagnosis Date   Anxiety    Clostridioides difficile infection    Depression    Migraine    Palpitations    Pancreatitis    Ulcerative colitis [facility])    Past Surgical History:  Procedure Laterality Date   APPENDECTOMY Right    BIOPSY  08/18/2019   Procedure: BIOPSY;  Surgeon: Lynann Bologna, MD;  [facility];  Service: Endoscopy;;   COLONOSCOPY N/A 08/18/2019   Procedure: COLONOSCOPY;  Surgeon: Lynann Bologna, MD;  [facility];  Service: Endoscopy;  Laterality: N/A;   COLONOSCOPY  12/12/2021   per Dr. Chales Abrahams, no polyps, repeat in 5 yrs   ESOPHAGOGASTRODUODENOSCOPY N/A 08/18/2019   per Dr. Chales Abrahams, showed GERD, neg for H pylori   POLYPECTOMY  08/18/2019   Procedure: POLYPECTOMY;  Surgeon: Lynann Bologna, MD;  [facility];  Service: Endoscopy;;   TONSILLECTOMY      reports that she has been smoking cigarettes. She has never used smokeless tobacco. She reports current alcohol use. She reports current drug use. Drug: Marijuana. family history includes Hypertension in her mother; Liver disease in her brother and brother; Prostate cancer in her father. No Known Allergies  Review of Systems  Constitutional:  Negative for  chills and fever.  HENT:  Negative for sinus pain.   Respiratory:  Positive for cough. Negative for hemoptysis and shortness of breath.       Objective:     BP 124/84 (BP Location: Left Arm, Patient Position: Sitting, Cuff Size: Normal)   Pulse 75   Temp 98.1 F (36.7 C) (Oral)   Wt 137 lb 14.4 oz (62.6 kg)   SpO2 98%   BMI 22.95 kg/m  BP Readings from Last 3 Encounters:  08/24/23 124/84  08/10/23 130/80  02/06/23 126/78   Wt Readings from Last 3 Encounters:  08/24/23 137 lb 14.4 oz (62.6 kg)  08/10/23 138 lb 6.4 oz (62.8 kg)  02/06/23 126 lb 9.6 oz (57.4 kg)      Physical Exam Vitals reviewed.  Constitutional:      General: She is not in acute distress.    Appearance: She is not ill-appearing.  Cardiovascular:     Rate and Rhythm: Normal rate and regular rhythm.  Pulmonary:     Comments: Clear to auscultation throughout but does have somewhat diminished breath sounds throughout.  No retractions.  No rales.  No wheezes. Musculoskeletal:     Cervical back: Neck supple.  Lymphadenopathy:     Cervical: No cervical adenopathy.  Neurological:     Mental Status: She is alert.      No results found for any visits on 08/24/23.  The 10-year ASCVD risk score (Arnett DK, et al., 2019) is: 11.1%    Assessment & Plan:   Patient seen with cough for the past couple of weeks.  Fairly longstanding history of smoking.  Suspect component of COPD.  Does not any red flags such as weight loss, hemoptysis, hypoxemia, etc.  -Given likely underlying component of COPD recommend going and cover with doxycycline 100 mg twice daily for 7 days -Tessalon Perles 100 mg every 8 hours as needed for cough -Follow-up immediately for any shortness of breath or fever [name], MD

## 2023-08-25 ENCOUNTER — Ambulatory Visit: Payer: Medicare HMO | Admitting: Family Medicine

## 2023-08-28 NOTE — Telephone Encounter (Signed)
 She was seen by Dr. Caryl Never on 08-24-23

## 2023-09-03 ENCOUNTER — Telehealth: Payer: Self-pay | Admitting: Gastroenterology

## 2023-09-03 NOTE — Telephone Encounter (Signed)
 Returned patient call & she stated she's been having an increase in burping for the last 3 weeks, with upper abdominal pain unrelieved by the omeprazole  that she is currently on. She recently finished a course of doxycycline  & was told to increase her water intake which she did. The last couple of nights she has also woken up to a feeling that something was stuck in my throat. She's currently taking omeprazole  has prescribed. Last seen 11/2021 for an endo with Dr. Charlanne. Scheduled her for an appointment tomorrow with Dr. Charlanne at 1:50 pm.

## 2023-09-03 NOTE — Telephone Encounter (Signed)
 Inbound call from patient stating that she is having issues with acid reflux and Ulcerative pancolitis. Patient was scheduled for an appointment with Delon Failing on 1/17 at 11:00 and is requesting a call from the nurse to discuss her symptoms in the meantime. Please advise.

## 2023-09-04 ENCOUNTER — Ambulatory Visit (INDEPENDENT_AMBULATORY_CARE_PROVIDER_SITE_OTHER): Payer: Medicare HMO | Admitting: Gastroenterology

## 2023-09-04 ENCOUNTER — Encounter: Payer: Self-pay | Admitting: Gastroenterology

## 2023-09-04 ENCOUNTER — Other Ambulatory Visit (INDEPENDENT_AMBULATORY_CARE_PROVIDER_SITE_OTHER): Payer: Medicare HMO

## 2023-09-04 VITALS — BP 118/72 | HR 69 | Ht 65.0 in | Wt 140.0 lb

## 2023-09-04 DIAGNOSIS — K5100 Ulcerative (chronic) pancolitis without complications: Secondary | ICD-10-CM | POA: Diagnosis not present

## 2023-09-04 DIAGNOSIS — K51019 Ulcerative (chronic) pancolitis with unspecified complications: Secondary | ICD-10-CM | POA: Diagnosis not present

## 2023-09-04 DIAGNOSIS — K219 Gastro-esophageal reflux disease without esophagitis: Secondary | ICD-10-CM | POA: Diagnosis not present

## 2023-09-04 DIAGNOSIS — R1033 Periumbilical pain: Secondary | ICD-10-CM

## 2023-09-04 DIAGNOSIS — R110 Nausea: Secondary | ICD-10-CM | POA: Diagnosis not present

## 2023-09-04 DIAGNOSIS — R6881 Early satiety: Secondary | ICD-10-CM

## 2023-09-04 LAB — CBC
HCT: 39.8 % (ref 36.0–46.0)
Hemoglobin: 13.2 g/dL (ref 12.0–15.0)
MCHC: 33.2 g/dL (ref 30.0–36.0)
MCV: 90.5 fL (ref 78.0–100.0)
Platelets: 209 10*3/uL (ref 150.0–400.0)
RBC: 4.4 Mil/uL (ref 3.87–5.11)
RDW: 14 % (ref 11.5–15.5)
WBC: 7.2 10*3/uL (ref 4.0–10.5)

## 2023-09-04 LAB — COMPREHENSIVE METABOLIC PANEL
ALT: 10 U/L (ref 0–35)
AST: 12 U/L (ref 0–37)
Albumin: 4.5 g/dL (ref 3.5–5.2)
Alkaline Phosphatase: 67 U/L (ref 39–117)
BUN: 18 mg/dL (ref 6–23)
CO2: 28 meq/L (ref 19–32)
Calcium: 9.4 mg/dL (ref 8.4–10.5)
Chloride: 103 meq/L (ref 96–112)
Creatinine, Ser: 0.79 mg/dL (ref 0.40–1.20)
GFR: 77.61 mL/min (ref >60.00)
Glucose, Bld: 94 mg/dL (ref 70–99)
Potassium: 4.2 meq/L (ref 3.5–5.1)
Sodium: 140 meq/L (ref 135–145)
Total Bilirubin: 0.7 mg/dL (ref 0.2–1.2)
Total Protein: 7.1 g/dL (ref 6.0–8.3)

## 2023-09-04 LAB — C-REACTIVE PROTEIN: CRP: <1 mg/dL (ref 0.5–20.0)

## 2023-09-04 MED ORDER — PANTOPRAZOLE SODIUM 40 MG PO TBEC: 40.0000 mg | DELAYED_RELEASE_TABLET | Freq: Every day | ORAL | 2 refills | Status: DC

## 2023-09-04 MED ORDER — MESALAMINE 1.2 G PO TBEC: 2.4000 g | DELAYED_RELEASE_TABLET | Freq: Every day | ORAL | 1 refills | Status: DC

## 2023-09-04 MED ORDER — ONDANSETRON 4 MG PO TBDP: 4.0000 mg | ORAL_TABLET | Freq: Three times a day (TID) | ORAL | 0 refills | Status: DC | PRN

## 2023-09-04 NOTE — Progress Notes (Signed)
 Chief Complaint: FU  Referring Provider:  Johnny Garnette LABOR, MD      ASSESSMENT AND PLAN;   #1. Periumblical pain  #2. GERD with nausea/early satiety  #3. 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  Last exacerbation 07/2019, treated with prednisone  and then maintained on mesalamine .  Plan: - Change omeprazole  to Protonix  40mg  po QD #30 6RF - mesalamine  2.4g po QAM (decreased dose) - Check CBC, CMP, CRP, celiac serology - Zofran  0.4 mg ODT Q8hrs #20. - CT AP with contrast - Call us  if with any problems until follow-up. - Recall colonoscopy 11/2024 - D/W pt and husband.    HPI:    [name] is a 68 y.o. female  For follow-up visit Accompanied by her husband  The patient, with a history of gastroesophageal reflux disease (GERD), presented with a recent episode of periumblical pain and ongoing nausea. The pain, described as a sensation of 'something stuck,' was initially localized to the upper abdomen but has since migrated downwards. The patient also reported frequent gas and soft bowel movements, occurring twice daily.  The patient's symptoms began after taking the last dose of a course of doxycycline , an antibiotic known to cause gastrointestinal irritation. The patient admitted to not drinking enough water with the medication and lying down soon after ingestion, which may have contributed to the onset of symptoms.  The patient also reported persistent heartburn despite taking omeprazole  20mg  daily. The heartburn is not severe but is a new symptom for the patient. The patient has also experienced episodes of vomiting, particularly in the evenings and sometimes after consuming alcohol.  The patient has been managing the nausea with ondansetron , taken sublingually as needed, which has been effective. The patient also mentioned a recent retirement from a stressful job, which may be contributing to the current symptoms.  The patient has a history of smoking but did not specify the current  smoking status during the consultation. The patient is also on mesalamine  for an unspecified condition and calcium  supplements, which have been known to cause constipation. However, the patient's bowel movements have been soft rather than constipated.        Has retired this week.  She had a very stressful job.  She was not sleeping very well.       Previous GI work-up:  Results   LABS TSH: 0.96 (02/2023) B12: 272 (02/2023) Hb: 13.2 (02/2023) HbA1c: 5.6 (02/2023)  DIAGNOSTIC Endoscopy: Normal esophagus, small hiatal hernia, normal stomach and duodenal biopsies (11/2021) Colonoscopy: Normal suggestive of complete mucosal healing.  Mild sigmoid diverticulosis (11/2021).  Repeat 3 years.     Last colonoscopy 08/18/2019: Moderately active pancolitis.  Normal TI.  CT AP 07/2019 1. Mild wall thickening suggested throughout the colon.  2. There are sigmoid colon diverticula but no evidence of diverticulitis. 3. No other acute finding within the abdomen pelvis. Prominent to mildly enlarged gastrohepatic ligament lymph nodes, most likely chronic and reactive. 4. Possible small stones in the gallbladder neck and cystic duct, but no evidence of acute cholecystitis.  US  07/2019 No visible cholelithiasis, choledocholithiasis, biliary dilatation or features of acute cholecystitis.  Blood tests including TB Gold, TPMT, CBC, C-reactive protein, CMP were within normal limits.  Wt Readings from Last 3 Encounters:  09/04/23 140 lb (63.5 kg)  08/24/23 137 lb 14.4 oz (62.6 kg)  08/10/23 138 lb 6.4 oz (62.8 kg)     Past Medical History:  Diagnosis Date  . Anxiety   . Clostridioides difficile infection   .  Depression   . Migraine   . Palpitations   . Pancreatitis   . Ulcerative colitis [facility])     Past Surgical History:  Procedure Laterality Date  . APPENDECTOMY Right   . BIOPSY  08/18/2019   Procedure: BIOPSY;  Surgeon: Charlanne Groom, MD;  Location: [facility];  Service:  Endoscopy;;  . COLONOSCOPY N/A 08/18/2019   Procedure: COLONOSCOPY;  Surgeon: Charlanne Groom, MD;  Location: [facility];  Service: Endoscopy;  Laterality: N/A;  . COLONOSCOPY  12/12/2021   per Dr. Charlanne, no polyps, repeat in 5 yrs  . ESOPHAGOGASTRODUODENOSCOPY N/A 08/18/2019   per Dr. Charlanne, showed GERD, neg for H pylori  . POLYPECTOMY  08/18/2019   Procedure: POLYPECTOMY;  Surgeon: Charlanne Groom, MD;  Location: [facility];  Service: Endoscopy;;  . TONSILLECTOMY      Family History  Problem Relation Age of Onset  . Prostate cancer Father   . Hypertension Mother   . Liver disease Brother        alcohol related  . Liver disease Brother        alcohol related  . Colon cancer Neg Hx   . Esophageal cancer Neg Hx   . Stomach cancer Neg Hx   . Pancreatic cancer Neg Hx     Social History   Tobacco Use  . Smoking status: Every Day    Current packs/day: 0.50    Types: Cigarettes  . Smokeless tobacco: Never  Vaping Use  . Vaping status: Never Used  Substance Use Topics  . Alcohol use: Yes    Comment: socially  . Drug use: Yes    Types: Marijuana    Comment: every couple of days at night    Current Outpatient Medications  Medication Sig Dispense Refill  . ALPRAZolam  (XANAX ) 0.5 MG tablet TAKE 1 TABLET(0.5 MG) BY MOUTH THREE TIMES DAILY AS NEEDED FOR ANXIETY OR SLEEP 90 tablet 5  . buPROPion  (WELLBUTRIN  XL) 300 MG 24 hr tablet TAKE 1 TABLET BY MOUTH EVERY DAY 90 tablet 0  . calcium -vitamin D  250-100 MG-UNIT tablet Take 1 tablet by mouth 2 (two) times daily.    . mesalamine  (LIALDA ) 1.2 g EC tablet Take 2 tablets (2.4 g total) by mouth daily with breakfast. 180 tablet 1  . Multiple Vitamin (MULTIVITAMIN) tablet Take 1 tablet by mouth daily.    . nicotine  (NICODERM CQ ) 14 mg/24hr patch Place 1 patch (14 mg total) onto the skin daily. 28 patch 11  . ondansetron  (ZOFRAN -ODT) 4 MG disintegrating tablet Take 1 tablet (4 mg total) by mouth every 8 (eight) hours as needed for nausea  or vomiting. 20 tablet 2  . benzonatate  (TESSALON  PERLES) 100 MG capsule Take 1 capsule (100 mg total) by mouth 3 (three) times daily as needed. (Patient not taking: Reported on 09/04/2023) 30 capsule 0  . doxycycline  (VIBRAMYCIN ) 100 MG capsule Take 1 capsule (100 mg total) by mouth 2 (two) times daily. (Patient not taking: Reported on 09/04/2023) 14 capsule 0  . omeprazole  (PRILOSEC) 20 MG capsule Take 1 capsule (20 mg total) by mouth daily. (Patient not taking: Reported on 09/04/2023) 90 capsule 0  . sertraline  (ZOLOFT ) 100 MG tablet Take 1 tablet (100 mg total) by mouth daily. (Patient not taking: Reported on 09/04/2023) 30 tablet 5   No current facility-administered medications for this visit.    No Known Allergies  Review of Systems:  As above     Physical Exam:    BP 118/72   Pulse 69  Ht 5' 5 (1.651 m)   Wt 140 lb (63.5 kg)   BMI 23.30 kg/m  Wt Readings from Last 3 Encounters:  09/04/23 140 lb (63.5 kg)  08/24/23 137 lb 14.4 oz (62.6 kg)  08/10/23 138 lb 6.4 oz (62.8 kg)   Constitutional:  Well-developed, in no acute distress. Psychiatric: Normal mood and affect. Behavior is normal. HEENT:  Conjunctivae are normal. No scleral icterus. Cardiovascular: Normal rate, regular rhythm. No edema Pulmonary/chest: Effort normal and breath sounds normal. No wheezing, rales or rhonchi. Abdominal: Soft, nondistended. Nontender. Bowel sounds active throughout. There are no masses palpable. No hepatomegaly. Rectal:  defered Neurological: Alert and oriented to person place and time. Skin: Skin is warm and dry. No rashes noted.  Data Reviewed: I have personally reviewed following labs and imaging studies  CBC:    Latest Ref Rng & Units 02/06/2023   10:52 AM 11/18/2021    1:17 PM 01/27/2020    2:12 PM  CBC  WBC 4.0 - 10.5 K/uL 7.2  7.2  6.1   Hemoglobin 12.0 - 15.0 g/dL 86.7  87.1  86.4   Hematocrit 36.0 - 46.0 % 39.5  37.3  39.2   Platelets 150.0 - 400.0 K/uL 202.0  264  201.0      CMP:    Latest Ref Rng & Units 02/06/2023   10:52 AM 11/18/2021    1:17 PM 01/27/2020    2:12 PM  CMP  Glucose 70 - 99 mg/dL 86  897  99   BUN 6 - 23 mg/dL 20  13  26    Creatinine 0.40 - 1.20 mg/dL 9.25  9.11  9.11   Sodium 135 - 145 mEq/L 141  145  139   Potassium 3.5 - 5.1 mEq/L 4.7  4.9  4.4   Chloride 96 - 112 mEq/L 106  107  106   CO2 19 - 32 mEq/L 27  20  27    Calcium  8.4 - 10.5 mg/dL 9.3  9.7  9.1   Total Protein 6.0 - 8.3 g/dL 6.9  7.3  6.7   Total Bilirubin 0.2 - 1.2 mg/dL 0.7  0.2  0.5   Alkaline Phos 39 - 117 U/L 63  84  60   AST 0 - 37 U/L 18  13  15    ALT 0 - 35 U/L 12  12  16        Anselm Bring, MD 09/04/2023, 2:16 PM  Cc: Johnny Garnette LABOR, MD

## 2023-09-04 NOTE — Patient Instructions (Addendum)
 Your provider has requested that you go to the basement level for lab work before leaving today. Press B on the elevator. The lab is located at the first door on the left as you exit the elevator.  You have been scheduled for a CT scan of the abdomen and pelvis at [facility], 1st floor Radiology. You are scheduled on 09/11/23 at 5:30 pm. You should arrive 2 hours early at 3:15 pm  to drink oral contrast and registration.  Please follow the written instructions below on the day of your exam:   1) Do not eat anything after 1:30 pm (4 hours prior to your test)   You may take any medications as prescribed with a small amount of water, if necessary. If you take any of the following medications: METFORMIN, GLUCOPHAGE, GLUCOVANCE, AVANDAMET, RIOMET, FORTAMET, ACTOPLUS MET, JANUMET, GLUMETZA or METAGLIP, you MAY be asked to HOLD this medication 48 hours AFTER the exam.   The purpose of you drinking the oral contrast is to aid in the visualization of your intestinal tract. The contrast solution may cause some diarrhea. Depending on your individual set of symptoms, you may also receive an intravenous injection of x-ray contrast/dye. Plan on being at [facility] for 45 minutes or longer, depending on the type of exam you are having performed.   If you have any questions regarding your exam or if you need to reschedule, you may call [facility] at [phone] between the hours of 8:00 am and 5:00 pm, Monday-Friday.   We have sent the following medications to your pharmacy for you to pick up at your convenience: Pantoprazole , Zofran    Stop Omeprazole .   Start Pantoprazole  40 mg once daily.   Decrease your Mesalamine  to 2.4 mg once daily.   Thank you,  Dr. Lynnie Bring

## 2023-09-07 ENCOUNTER — Telehealth: Payer: Self-pay

## 2023-09-07 LAB — GLIA (IGA/G) + TTG IGA
Antigliadin Abs, IgA: 3 U (ref 0–19)
Gliadin IgG: 2 U (ref 0–19)
Transglutaminase IgA: <2 U/mL (ref 0–3)

## 2023-09-07 NOTE — Telephone Encounter (Signed)
-----   Message from April M sent at 09/07/2023  8:02 AM EST ----- Regarding: POS CHANGE Good Morning, I wanted to let you know the insurance would not approve [facility] for her CT scan. They only gave me one option and that was:  [facility]  Site Address: [address] [place], [place] [address]  Everything else was OON.  Thank you :-)

## 2023-09-07 NOTE — Telephone Encounter (Signed)
 Patient made aware and she was notified about lab work as well. She said she will probably change her date completely so she has the number to call them to change the date

## 2023-09-07 NOTE — Telephone Encounter (Signed)
 Cancelled CT with [facility] and called [facility] to change appt to 09-11-23 at 1120am address [address]  4 hours without solids food but can do liquids. Call number [phone]. Mychart message sent

## 2023-09-11 ENCOUNTER — Ambulatory Visit
Admission: RE | Admit: 2023-09-11 | Discharge: 2023-09-11 | Disposition: A | Discharge status: Home / Self Care | Payer: Medicare HMO | Source: Ambulatory Visit | Attending: Gastroenterology | Admitting: Gastroenterology

## 2023-09-11 ENCOUNTER — Ambulatory Visit: Payer: Medicare HMO | Admitting: Physician Assistant

## 2023-09-11 ENCOUNTER — Ambulatory Visit (HOSPITAL_COMMUNITY): Payer: Medicare HMO

## 2023-09-11 DIAGNOSIS — R1033 Periumbilical pain: Secondary | ICD-10-CM

## 2023-09-11 DIAGNOSIS — K219 Gastro-esophageal reflux disease without esophagitis: Secondary | ICD-10-CM

## 2023-09-11 DIAGNOSIS — K51019 Ulcerative (chronic) pancolitis with unspecified complications: Secondary | ICD-10-CM

## 2023-09-11 MED ORDER — IOPAMIDOL (ISOVUE-300) INJECTION 61%
100.0000 mL | Freq: Once | INTRAVENOUS | Status: AC | PRN
  Administered 2023-09-11: 100 mL via INTRAVENOUS

## 2023-09-17 ENCOUNTER — Telehealth: Payer: Self-pay | Admitting: Gastroenterology

## 2023-09-17 NOTE — Telephone Encounter (Signed)
Patient called requesting to speak with a nurse for results, requesting a call back

## 2023-09-17 NOTE — Telephone Encounter (Signed)
Refer to CT results 09/11/23.

## 2023-09-27 ENCOUNTER — Other Ambulatory Visit: Payer: Self-pay | Admitting: Internal Medicine

## 2023-09-27 DIAGNOSIS — F330 Major depressive disorder, recurrent, mild: Secondary | ICD-10-CM

## 2023-09-29 MED ORDER — BUPROPION HCL ER (XL) 300 MG PO TB24: 300.0000 mg | ORAL_TABLET | Freq: Every day | ORAL | 0 refills | Status: DC

## 2023-10-27 ENCOUNTER — Other Ambulatory Visit: Payer: Self-pay

## 2023-10-27 ENCOUNTER — Emergency Department (HOSPITAL_BASED_OUTPATIENT_CLINIC_OR_DEPARTMENT_OTHER)
Admission: EM | Admit: 2023-10-27 | Discharge: 2023-10-27 | Disposition: A | Discharge status: Home / Self Care | Attending: Emergency Medicine | Admitting: Emergency Medicine

## 2023-10-27 ENCOUNTER — Encounter (HOSPITAL_BASED_OUTPATIENT_CLINIC_OR_DEPARTMENT_OTHER): Payer: Self-pay | Admitting: Emergency Medicine

## 2023-10-27 ENCOUNTER — Emergency Department (HOSPITAL_BASED_OUTPATIENT_CLINIC_OR_DEPARTMENT_OTHER)

## 2023-10-27 DIAGNOSIS — R7989 Other specified abnormal findings of blood chemistry: Secondary | ICD-10-CM | POA: Diagnosis not present

## 2023-10-27 DIAGNOSIS — R079 Chest pain, unspecified: Secondary | ICD-10-CM | POA: Insufficient documentation

## 2023-10-27 DIAGNOSIS — F17200 Nicotine dependence, unspecified, uncomplicated: Secondary | ICD-10-CM | POA: Insufficient documentation

## 2023-10-27 DIAGNOSIS — I159 Secondary hypertension, unspecified: Secondary | ICD-10-CM

## 2023-10-27 LAB — BASIC METABOLIC PANEL
Anion gap: 9 (ref 5–15)
BUN: 16 mg/dL (ref 8–23)
CO2: 22 mmol/L (ref 22–32)
Calcium: 9.1 mg/dL (ref 8.9–10.3)
Chloride: 106 mmol/L (ref 98–111)
Creatinine, Ser: 0.81 mg/dL (ref 0.44–1.00)
GFR, Estimated: >60 mL/min (ref >60)
Glucose, Bld: 103 mg/dL — ABNORMAL HIGH (ref 70–99)
Potassium: 4.3 mmol/L (ref 3.5–5.1)
Sodium: 137 mmol/L (ref 135–145)

## 2023-10-27 LAB — CBC
HCT: 39 % (ref 36.0–46.0)
Hemoglobin: 13.1 g/dL (ref 12.0–15.0)
MCH: 29.9 pg (ref 26.0–34.0)
MCHC: 33.6 g/dL (ref 30.0–36.0)
MCV: 89 fL (ref 80.0–100.0)
Platelets: 202 10*3/uL (ref 150–400)
RBC: 4.38 MIL/uL (ref 3.87–5.11)
RDW: 13.2 % (ref 11.5–15.5)
WBC: 5.7 10*3/uL (ref 4.0–10.5)
nRBC: 0 % (ref 0.0–0.2)

## 2023-10-27 LAB — D-DIMER, QUANTITATIVE: D-Dimer, Quant: 1.55 ug{FEU}/mL — ABNORMAL HIGH (ref 0.00–0.50)

## 2023-10-27 LAB — TROPONIN I (HIGH SENSITIVITY)
Troponin I (High Sensitivity): 3 ng/L (ref <18)
Troponin I (High Sensitivity): 4 ng/L (ref <18)

## 2023-10-27 MED ORDER — IOHEXOL 350 MG/ML SOLN
100.0000 mL | Freq: Once | INTRAVENOUS | Status: AC | PRN
Start: 2023-10-27 — End: 2023-10-27
  Administered 2023-10-27: 80 mL via INTRAVENOUS

## 2023-10-27 NOTE — ED Notes (Signed)
 Patient transported to X-ray

## 2023-10-27 NOTE — ED Triage Notes (Signed)
 Pt POV steady gait- c/o sharp mid chest pain x 1 hour.   Also c/o intermittent dizziness. Orthostatic dizziness in lobby prior to triage.

## 2023-10-27 NOTE — ED Provider Notes (Signed)
 [place] [facility] Provider Note   CSN: [id number] Arrival date & time: 10/27/23  1129     History  Chief Complaint  Patient presents with   Chest Pain    [name] is a 68 y.o. female.  HPI 68 year old female presents with chest pain. Had chest pressure that was severe for about an hour. It started at rest and went away while in the waiting room.  She denies any shortness of breath, back or abdominal pain, diaphoresis or radiation of the pain.  No leg swelling or recent surgery but she did travel to [facility] about 2 weeks ago.  Denies a history of hypertension, hyperlipidemia, diabetes.  She is an everyday smoker.  Home Medications Prior to Admission medications   Medication Sig Start Date End Date Taking? Authorizing Provider  ALPRAZolam (XANAX) 0.5 MG tablet TAKE 1 TABLET(0.5 MG) BY MOUTH THREE TIMES DAILY AS NEEDED FOR ANXIETY OR SLEEP 07/21/23   Nelwyn Salisbury, MD  benzonatate (TESSALON PERLES) 100 MG capsule Take 1 capsule (100 mg total) by mouth 3 (three) times daily as needed. Patient not taking: Reported on 09/04/2023 08/24/23   Kristian Covey, MD  buPROPion (WELLBUTRIN XL) 300 MG 24 hr tablet Take 1 tablet (300 mg total) by mouth daily. 09/29/23   Nelwyn Salisbury, MD  calcium-vitamin D 250-100 MG-UNIT tablet Take 1 tablet by mouth 2 (two) times daily.    [provider]  doxycycline (VIBRAMYCIN) 100 MG capsule Take 1 capsule (100 mg total) by mouth 2 (two) times daily. Patient not taking: Reported on 09/04/2023 08/24/23   Kristian Covey, MD  mesalamine (LIALDA) 1.2 g EC tablet Take 2 tablets (2.4 g total) by mouth daily with breakfast. 09/04/23   Lynann Bologna, MD  Multiple Vitamin (MULTIVITAMIN) tablet Take 1 tablet by mouth daily.    [provider]  nicotine (NICODERM CQ) 14 mg/24hr patch Place 1 patch (14 mg total) onto the skin daily. 03/18/23   Nelwyn Salisbury, MD  ondansetron (ZOFRAN-ODT) 4 MG  disintegrating tablet Take 1 tablet (4 mg total) by mouth every 8 (eight) hours as needed for nausea or vomiting. 09/04/23   Lynann Bologna, MD  pantoprazole (PROTONIX) 40 MG tablet Take 1 tablet (40 mg total) by mouth daily. 09/04/23   Cirigliano, Vito V, DO  sertraline (ZOLOFT) 100 MG tablet Take 1 tablet (100 mg total) by mouth daily. Patient not taking: Reported on 09/04/2023 02/06/23   Nelwyn Salisbury, MD      Allergies    Patient has no known allergies.    Review of Systems   Review of Systems  Constitutional:  Negative for diaphoresis.  Respiratory:  Negative for shortness of breath.   Cardiovascular:  Positive for chest pain. Negative for leg swelling.  Gastrointestinal:  Negative for abdominal pain and vomiting.  Musculoskeletal:  Negative for back pain.    Physical Exam Updated Vital Signs BP (!) 171/70   Pulse (!) 57   Temp 98 F (36.7 C)   Resp 15   Ht 5' 4.5" (1.638 m)   Wt 62.6 kg   SpO2 99%   BMI 23.32 kg/m  Physical Exam Vitals and nursing note reviewed.  Constitutional:      Appearance: She is well-developed.  HENT:     Head: Normocephalic and atraumatic.  Cardiovascular:     Rate and Rhythm: Normal rate and regular rhythm.     Heart sounds: Normal heart sounds.  Pulmonary:     Effort: Pulmonary effort is normal.     Breath sounds: Normal breath sounds.  Abdominal:     Palpations: Abdomen is soft.     Tenderness: There is no abdominal tenderness.  Skin:    General: Skin is warm and dry.  Neurological:     Mental Status: She is alert.     ED Results / Procedures / Treatments   Labs (all labs ordered are listed, but only abnormal results are displayed) Labs Reviewed  BASIC METABOLIC PANEL - Abnormal; Notable for the following components:      Result Value   Glucose, Bld 103 (*)    All other components within normal limits  CBC  D-DIMER, QUANTITATIVE  TROPONIN I (HIGH SENSITIVITY)  TROPONIN I (HIGH SENSITIVITY)    EKG EKG  Interpretation Date/Time:  Tuesday October 27 2023 11:45:25 EST Ventricular Rate:  63 PR Interval:  126 QRS Duration:  90 QT Interval:  422 QTC Calculation: 432 R Axis:   60  Text Interpretation: Sinus rhythm Left atrial enlargement no acute ST/T changes similar to 2007 Confirmed by Pricilla Loveless [phone]) on 10/27/2023 1:22:46 PM  Radiology DG Chest 2 View Result Date: 10/27/2023 CLINICAL DATA:  Mid sharp chest pain EXAM: CHEST - 2 VIEW COMPARISON:  Chest radiograph 11/28/2012 FINDINGS: The heart size and mediastinal contours are within normal limits. Both lungs are clear. The visualized skeletal structures are unremarkable. IMPRESSION: No active cardiopulmonary disease. Electronically Signed   By: Annia Belt M.D.   On: 10/27/2023 13:34    Procedures Procedures    Medications Ordered in ED Medications - No data to display  ED Course/ Medical Decision Making/ A&P                                 Medical Decision Making Amount and/or Complexity of Data Reviewed Labs: ordered.    Details: First troponin negative, D-dimer elevated Radiology: ordered and independent interpretation performed.    Details: No CHF ECG/medicine tests: ordered and independent interpretation performed.    Details: No ischemia   Patient chest pain is completely resolved.  She is still hypertensive though currently asymptomatic.  She denies a history of hypertension.  For troponin is negative but based on time course will certainly need a second.  Given her recent trip, D-dimer sent for otherwise low risk PE and is elevated so a CTA has been ordered. Care transferred to [facility].        Final Clinical Impression(s) / ED Diagnoses Final diagnoses:  None    Rx / DC Orders ED Discharge Orders     None         Pricilla Loveless, MD 10/27/23 [phone]

## 2023-10-27 NOTE — Discharge Instructions (Signed)
 Call make an appointment with primary care physician in the next 7 to 14 days or recheck of blood pressure.  Decrease salt, alcohol, and caffeine in the next 2 weeks to help manage high blood pressure readings.  Follow-up with primary care physician for cardiology referral if chest pain continues.

## 2023-10-27 NOTE — ED Notes (Signed)
 Patient assisted to bathroom with family reports mild dizziness upon walking.

## 2023-10-27 NOTE — ED Provider Notes (Signed)
 3:08 PM Patient signed out to me by previous ED physician. Pt is a 68 yo female presenting for chest pain. No n/v/diaphoresis.  Stable ecg and troponins 3>4  P: CTPE pending for elevated dimer  Physical Exam  BP (!) 167/74   Pulse (!) 58   Temp 98 F (36.7 C)   Resp 13   Ht 5' 4.5" (1.638 m)   Wt 62.6 kg   SpO2 100%   BMI 23.32 kg/m   Physical Exam  Procedures  Procedures  ED Course / MDM    Medical Decision Making Amount and/or Complexity of Data Reviewed Labs: ordered. Radiology: ordered.  Risk Prescription drug management.   CT pulmonary embolism study negative for PE.  No pneumonia or focal opacities.  No pneumothorax.  Patient originally presented for chest pain but has a reassuring chest EKG and troponins x 2.  She is chest pain free at this time.  She is hypertensive at 160/85.  Chart review demonstrates she had a GI visit in January and a PCP visit in December 2024 that demonstrated blood pressures 120 systolic and below.  Commend she follow-up with her PCP in the next 7 to 14 days for recheck on blood pressure and station of antihypertensives if blood pressure still elevated at that time.  She does admit to a diet high in salt.  I recommend decreasing salt, alcohol use, and caffeine at this time.  She still having chest pain despite normal blood pressure readings at this time recommend referral to cardiology for stress test.  Patient in no distress and overall condition improved here in the ED. Detailed discussions were had with the patient regarding current findings, and need for close f/u with PCP or on call doctor. The patient has been instructed to return immediately if the symptoms worsen in any way for re-evaluation. Patient verbalized understanding and is in agreement with current care plan. All questions answered prior to discharge.        Edwin Dada P, DO 10/27/23 1845

## 2023-11-09 ENCOUNTER — Encounter: Payer: Self-pay | Admitting: Family Medicine

## 2023-11-09 ENCOUNTER — Ambulatory Visit (INDEPENDENT_AMBULATORY_CARE_PROVIDER_SITE_OTHER): Admitting: Family Medicine

## 2023-11-09 ENCOUNTER — Inpatient Hospital Stay: Admitting: Family Medicine

## 2023-11-09 VITALS — BP 146/78 | HR 74 | Temp 98.0°F | Ht 64.5 in | Wt 139.0 lb

## 2023-11-09 DIAGNOSIS — I700 Atherosclerosis of aorta: Secondary | ICD-10-CM | POA: Diagnosis not present

## 2023-11-09 DIAGNOSIS — I10 Essential (primary) hypertension: Secondary | ICD-10-CM | POA: Insufficient documentation

## 2023-11-09 DIAGNOSIS — Z23 Encounter for immunization: Secondary | ICD-10-CM | POA: Diagnosis not present

## 2023-11-09 DIAGNOSIS — R079 Chest pain, unspecified: Secondary | ICD-10-CM

## 2023-11-09 DIAGNOSIS — E785 Hyperlipidemia, unspecified: Secondary | ICD-10-CM | POA: Insufficient documentation

## 2023-11-09 MED ORDER — ATORVASTATIN CALCIUM 10 MG PO TABS: 10.0000 mg | ORAL_TABLET | Freq: Every day | ORAL | 3 refills | Status: DC

## 2023-11-09 MED ORDER — LOSARTAN POTASSIUM 50 MG PO TABS: 50.0000 mg | ORAL_TABLET | Freq: Every day | ORAL | 3 refills | Status: DC

## 2023-11-09 NOTE — Progress Notes (Signed)
   Subjective:    Patient ID: [name], female    DOB: [birth year], 68 y.o.   MRN: [id number]  HPI Here to follow up an ED visit on 10-27-23. She presented with a squeezing substernal chest pain that she has never experienced before. She felt slightly SOB. No sweats or nausea. This started when she was sitting at home at 11 am. At the ED her BP was elevated at 167/74. EKG was normal. Troponins  and renal function were normal. The D dimer was slightly elevated so they did a chest CTA. This ruled out any PE, but it did demonstrate aortic atherosclerosis. After about one hour the chest pain resolved and she was sent home. She has had no more pain since then. She has occasional heartburn but not very often. She has been checking her BP at home since then and it has averaged in the 150's and 160's systolic. Of note her last lipid levels in June 2024 showed an LDL of 138 with an HDL of 62.    Review of Systems  Constitutional: Negative.   Respiratory: Negative.    Cardiovascular: Negative.   Gastrointestinal: Negative.   Genitourinary: Negative.   Neurological: Negative.        Objective:   Physical Exam Constitutional:      Appearance: Normal appearance.  Cardiovascular:     Rate and Rhythm: Normal rate and regular rhythm.     Pulses: Normal pulses.     Heart sounds: Normal heart sounds.  Pulmonary:     Effort: Pulmonary effort is normal.     Breath sounds: Normal breath sounds.  Neurological:     General: No focal deficit present.     Mental Status: She is alert and oriented to person, place, and time.           Assessment & Plan:  She had a recent bout of chest pain that was likely non-cardiac in origin, most likely esophageal. She has developed HTN, so we will start her on Losartan 50 mg daily. In light of her aortic atherosclerosis, we will start her on Atorvastatin 10 mg daily. We will also refer her to Cardiology to decide if some sort of stress testing is in order. We will  see her back in 3 weeks. We spent a total of ( 35  ) minutes reviewing records and discussing these issues.  Gershon Crane, MD

## 2023-11-11 ENCOUNTER — Telehealth: Payer: Self-pay

## 2023-11-11 NOTE — Telephone Encounter (Signed)
 Copied from CRM [id number]. Topic: Clinical - Medical Advice >> Nov 11, 2023 10:24 AM [name] wrote: Reason for CRM: Patient called in stating she thinks she has a UTI, would like to know if she needs to come in for a test on it or what to do. Is requesting a nurse to give her a callback regarding this

## 2023-11-11 NOTE — Telephone Encounter (Signed)
 Left a detailed message at the patient's cell number stating a visit is needed for evaluation and to call the office to schedule an appt.

## 2023-11-12 ENCOUNTER — Ambulatory Visit: Admitting: Internal Medicine

## 2023-11-13 ENCOUNTER — Ambulatory Visit (INDEPENDENT_AMBULATORY_CARE_PROVIDER_SITE_OTHER): Admitting: Family Medicine

## 2023-11-13 VITALS — BP 164/84 | HR 71 | Temp 98.0°F | Wt 141.0 lb

## 2023-11-13 DIAGNOSIS — R300 Dysuria: Secondary | ICD-10-CM | POA: Diagnosis not present

## 2023-11-13 LAB — POC URINALSYSI DIPSTICK (AUTOMATED)
Bilirubin, UA: NEGATIVE
Blood, UA: POSITIVE
Glucose, UA: NEGATIVE
Ketones, UA: NEGATIVE
Leukocytes, UA: NEGATIVE
Nitrite, UA: POSITIVE
Protein, UA: NEGATIVE
Spec Grav, UA: 1.025 (ref 1.010–1.025)
Urobilinogen, UA: 0.2 U/dL
pH, UA: 5.5 (ref 5.0–8.0)

## 2023-11-13 MED ORDER — CEPHALEXIN 500 MG PO CAPS: 500.0000 mg | ORAL_CAPSULE | Freq: Three times a day (TID) | ORAL | 0 refills | Status: DC

## 2023-11-13 NOTE — Progress Notes (Signed)
 Established Patient Office Visit  Subjective   Patient ID: [name], female    DOB: Jun 13, 1956  Age: 68 y.o. MRN: [id number]  Chief Complaint  Patient presents with   Urinary Tract Infection    HPI   [name] is seen with urinary symptoms.  For the past week or so she has had some frequency and mild burning intermittently.  No flank pain.  No fevers or chills.  No nausea or vomiting.  No gross hematuria.  She has had UTI previously but not recently.  No known drug allergies.  She has been recently diagnosed with hypertension just went on losartan a few days ago 50 mg daily.  Past Medical History:  Diagnosis Date   Anxiety    Clostridioides difficile infection    Depression    Migraine    Palpitations    Pancreatitis    Ulcerative colitis [facility])    Past Surgical History:  Procedure Laterality Date   APPENDECTOMY Right    BIOPSY  08/18/2019   Procedure: BIOPSY;  Surgeon: Lynann Bologna, MD;  Location: [facility];  Service: Endoscopy;;   COLONOSCOPY N/A 08/18/2019   Procedure: COLONOSCOPY;  Surgeon: Lynann Bologna, MD;  Location: [facility];  Service: Endoscopy;  Laterality: N/A;   COLONOSCOPY  12/12/2021   per Dr. Chales Abrahams, no polyps, repeat in 5 yrs   ESOPHAGOGASTRODUODENOSCOPY N/A 08/18/2019   per Dr. Chales Abrahams, showed GERD, neg for H pylori   POLYPECTOMY  08/18/2019   Procedure: POLYPECTOMY;  Surgeon: Lynann Bologna, MD;  Location: [facility];  Service: Endoscopy;;   TONSILLECTOMY      reports that she has been smoking cigarettes. She has never used smokeless tobacco. She reports current alcohol use. She reports current drug use. Drug: Marijuana. family history includes Hypertension in her mother; Liver disease in her brother and brother; Prostate cancer in her father. No Known Allergies  Review of Systems  Constitutional:  Negative for chills and fever.  Genitourinary:  Positive for dysuria and frequency. Negative for flank pain and hematuria.      Objective:      BP (!) 164/84 (BP Location: Left Arm, Cuff Size: Normal)   Pulse 71   Temp 98 F (36.7 C) (Oral)   Wt 141 lb (64 kg)   SpO2 98%   BMI 23.83 kg/m  BP Readings from Last 3 Encounters:  11/13/23 (!) 164/84  11/09/23 (!) 146/78  10/27/23 (!) 160/85   Wt Readings from Last 3 Encounters:  11/13/23 141 lb (64 kg)  11/09/23 139 lb (63 kg)  10/27/23 138 lb (62.6 kg)      Physical Exam Vitals reviewed.  Constitutional:      General: She is not in acute distress.    Appearance: She is not ill-appearing.  Cardiovascular:     Rate and Rhythm: Normal rate and regular rhythm.  Pulmonary:     Effort: Pulmonary effort is normal.     Breath sounds: Normal breath sounds.  Neurological:     Mental Status: She is alert.      Results for orders placed or performed in visit on 11/13/23  POCT Urinalysis Dipstick (Automated)  Result Value Ref Range   Color, UA Yellow    Clarity, UA Clear    Glucose, UA Negative Negative   Bilirubin, UA Negative    Ketones, UA Negative    Spec Grav, UA 1.025 1.010 - 1.025   Blood, UA Positive    pH, UA 5.5 5.0 - 8.0   Protein,  UA Negative Negative   Urobilinogen, UA 0.2 0.2 or 1.0 E.U./dL   Nitrite, UA Positive    Leukocytes, UA Negative Negative      The 10-year ASCVD risk score (Arnett DK, et al., 2019) is: 24.3%    Assessment & Plan:   Problem List Items Addressed This Visit   None Visit Diagnoses       Dysuria    -  Primary   Relevant Orders   POCT Urinalysis Dipstick (Automated) (Completed)   Urine Culture     1 week history of urine frequency and mild intermittent dysuria.  Urine dipstick reveals positive blood and positive nitrite but negative leukocyte.  Urine culture sent.  Start Keflex 500 mg 3 times daily for 5 days pending culture result.  Blood pressure was up still a bit today and she has scheduled follow-up with primary in a couple weeks to reassess  No follow-ups on file.    [name], MD

## 2023-11-15 LAB — URINE CULTURE
MICRO NUMBER:: 16231845
SPECIMEN QUALITY:: ADEQUATE

## 2023-11-19 ENCOUNTER — Other Ambulatory Visit: Payer: Self-pay | Admitting: Gastroenterology

## 2023-11-30 ENCOUNTER — Ambulatory Visit: Admitting: Family Medicine

## 2023-12-02 ENCOUNTER — Encounter: Payer: Self-pay | Admitting: Family Medicine

## 2023-12-02 ENCOUNTER — Ambulatory Visit (INDEPENDENT_AMBULATORY_CARE_PROVIDER_SITE_OTHER): Admitting: Family Medicine

## 2023-12-02 VITALS — BP 126/70 | HR 71 | Temp 98.8°F | Wt 142.0 lb

## 2023-12-02 DIAGNOSIS — I10 Essential (primary) hypertension: Secondary | ICD-10-CM | POA: Diagnosis not present

## 2023-12-02 DIAGNOSIS — R413 Other amnesia: Secondary | ICD-10-CM | POA: Diagnosis not present

## 2023-12-02 MED ORDER — PANTOPRAZOLE SODIUM 40 MG PO TBEC: 40.0000 mg | DELAYED_RELEASE_TABLET | Freq: Every day | ORAL | 3 refills | Status: DC

## 2023-12-02 MED ORDER — LOSARTAN POTASSIUM 50 MG PO TABS: 50.0000 mg | ORAL_TABLET | Freq: Every day | ORAL | 3 refills | Status: AC

## 2023-12-02 NOTE — Progress Notes (Signed)
   Subjective:    Patient ID: [name], female    DOB: 1955-09-24, 68 y.o.   MRN: [id number]  HPI Here to follow up on HTN. She has been taking Losartan 50 mg daily. She has felt fine. In addition she wants to discuss memory loss. She thinks she may have an early dementia. She often loses her train of thought while speaking and she cannot remember what she was talking about. She has tourble remembering anyone's name except for her family.    Review of Systems  Constitutional: Negative.   Respiratory: Negative.    Cardiovascular: Negative.        Objective:   Physical Exam Constitutional:      Appearance: Normal appearance.  Cardiovascular:     Rate and Rhythm: Normal rate and regular rhythm.     Pulses: Normal pulses.     Heart sounds: Normal heart sounds.  Pulmonary:     Effort: Pulmonary effort is normal.     Breath sounds: Normal breath sounds.  Neurological:     Mental Status: She is alert.           Assessment & Plan:  Her HTN Is now well controlled. She will continue on the Losartan. As fore memory loss, we will refer her to Neurology to evaluate.  Gershon Crane, MD

## 2023-12-10 ENCOUNTER — Encounter: Payer: Self-pay | Admitting: Physician Assistant

## 2023-12-12 ENCOUNTER — Other Ambulatory Visit: Payer: Self-pay | Admitting: Family Medicine

## 2023-12-24 ENCOUNTER — Ambulatory Visit (INDEPENDENT_AMBULATORY_CARE_PROVIDER_SITE_OTHER): Admitting: Physician Assistant

## 2023-12-24 ENCOUNTER — Other Ambulatory Visit

## 2023-12-24 ENCOUNTER — Ambulatory Visit

## 2023-12-24 ENCOUNTER — Encounter: Payer: Self-pay | Admitting: Physician Assistant

## 2023-12-24 VITALS — BP 107/62 | HR 74 | Ht 64.0 in | Wt 143.0 lb

## 2023-12-24 DIAGNOSIS — R413 Other amnesia: Secondary | ICD-10-CM | POA: Diagnosis not present

## 2023-12-24 NOTE — Progress Notes (Signed)
 Assessment/Plan:   [name] is a very pleasant 68 y.o. year old RH female with a history of hypertension, hyperlipidemia, prior tobacco abuse, anxiety, depression seen today for evaluation of memory loss. MoCA today is 26/30..  Workup is in progress.  Etiology of memory concerns is unclear, suspect multifactorial, will need diagnostic clarity. Patient is able to participate on ADLs and continues to drive without significant difficulties.     Memory Impairment of unclear etiology, likely of multiple etiologies  MRI brain without contrast to assess for underlying structural abnormality and assess vascular load  Neurocognitive testing to further evaluate cognitive concerns and determine other underlying cause of memory changes, including potential contribution from sleep, anxiety, attention, or depression  Check B12, TSH Continue to control mood as per PCP, recommend psychotherapy for anxiety and depression Recommend good control of cardiovascular risk factors, follow palpitations and hypertension with cardiology Follow up in 4 months   Subjective:   The patient is here alone   How long did patient have memory difficulties? For the last 5-6 years worse for  the last 2 years as she feels that she is losing her train of thought while speaking, "and then cannot remember what I am talking about".  She may have some difficulty remembering new information, conversations and names except for those of her family members. 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  Long-term memory is good. Loves brain games, music.  repeats oneself?  Endorsed, not frequently Disoriented when walking into a room?  Patient denies except occasionally not remembering what patient came to the room for    Leaving objects in unusual places? Endorsed, infrequent, but she has left milk in the cabinet last year  Wandering behavior?  Denies. Any personality  changes?  Denies. She feels "different", feeling that she is losing interest in going places, or engaging in conversations   Any history of depression?:  Endorsed   Hallucinations or paranoia?  Denies   Seizures? Denies  Any sleep changes?   "Sleeps well, for the last 6 months I don't know if it was a dream but, my heart is slamming out of my body"  She has RLS, not on meds. Denies sleepwalking   Sleep apnea? She does not know. Any hygiene concerns?  She is less interested in grooming    Independent of bathing and dressing?  Endorsed  Does the patient needs help with medications? Patient is in charge   Who is in charge of the finances? Patient is in charge     Any changes in appetite?  Denies. Drinks plenty water      Patient have trouble swallowing? Denies.   Does the patient cook? Yes, "following a recipe can be a challenge, forgetting ingredients".   Any kitchen accidents such as leaving the stove on? Denies.   Any history of headaches?   Premenopause, not now  Chronic pain ? Denies.   Ambulates with difficulty? Sometimes I walk and feel not walking great as before. She not active.  She does not exercise on a regular basis.   Recent falls or head injuries? Has fallen 2 years ago, need ing rotator cuff surgery.  No recent major falls  Vision changes? Needs new glasses, less acuity Any stroke like symptoms? Denies.   Any tremors?   Denies.   Any anosmia?  Denies.   Any incontinence of urine? Denies.   Any bowel dysfunction? 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 Denies.   History of heavy tobacco use? Quit x 6 weeks a 20 pack year   Family history of dementia? Denies. Mother is 41, "may be senile, but no diagnosis of dementia". Does patient drive? Yes, denies getting lost     Past Medical History:  Diagnosis Date   Anxiety    Clostridioides difficile infection    Depression    Migraine    Palpitations    Pancreatitis     Ulcerative colitis [facility])      Past Surgical History:  Procedure Laterality Date   APPENDECTOMY Right    BIOPSY  08/18/2019   Procedure: BIOPSY;  Surgeon: Lajuan Pila, MD;  Location: [facility];  Service: Endoscopy;;   COLONOSCOPY N/A 08/18/2019   Procedure: COLONOSCOPY;  Surgeon: Lajuan Pila, MD;  Location: [facility];  Service: Endoscopy;  Laterality: N/A;   COLONOSCOPY  12/12/2021   per Dr. Venice Gillis, no polyps, repeat in 5 yrs   ESOPHAGOGASTRODUODENOSCOPY N/A 08/18/2019   per Dr. Venice Gillis, showed GERD, neg for H pylori   POLYPECTOMY  08/18/2019   Procedure: POLYPECTOMY;  Surgeon: Lajuan Pila, MD;  Location: [facility];  Service: Endoscopy;;   TONSILLECTOMY       No Known Allergies  Current Outpatient Medications  Medication Instructions   ALPRAZolam  (XANAX ) 0.5 MG tablet TAKE 1 TABLET(0.5 MG) BY MOUTH THREE TIMES DAILY AS NEEDED FOR ANXIETY OR SLEEP   atorvastatin  (LIPITOR) 10 mg, Oral, Daily   buPROPion  (WELLBUTRIN  XL) 300 mg, Oral, Daily   losartan  (COZAAR ) 50 mg, Oral, Daily   mesalamine  (LIALDA ) 2.4 g, Oral, Daily with breakfast   Multiple Vitamin (MULTIVITAMIN) tablet 1 tablet, Daily   ondansetron  (ZOFRAN -ODT) 4 mg, Oral, Every 8 hours PRN   pantoprazole  (PROTONIX ) 40 mg, Oral, Daily   sertraline  (ZOLOFT ) 100 MG tablet TAKE 1 TABLET(100 MG) BY MOUTH DAILY     VITALS:   Vitals:   12/24/23 0759  BP: 107/62  Pulse: 74  SpO2: 98%  Weight: 143 lb (64.9 kg)  Height: 5\' 4"  (1.626 m)      PHYSICAL EXAM   HEENT:  Normocephalic, atraumatic. The superficial temporal arteries are without ropiness or tenderness. Cardiovascular: Regular rate and rhythm. Lungs: Clear to auscultation bilaterally. Neck: There are no carotid bruits noted bilaterally.  NEUROLOGICAL:    12/24/2023    9:00 AM  Montreal Cognitive Assessment   Visuospatial/ Executive (0/5) 5  Naming (0/3) 3  Attention: Read list of digits (0/2) 2  Attention: Read list of letters (0/1) 1  Attention:  Serial 7 subtraction starting at 100 (0/3) 2  Language: Repeat phrase (0/2) 2  Language : Fluency (0/1) 1  Abstraction (0/2) 1  Delayed Recall (0/5) 3  Orientation (0/6) 6  Total 26  Adjusted Score (based on education) 26        No data to display           Orientation:  Alert and oriented to person, place and time. No aphasia or dysarthria. Fund of knowledge is appropriate. Recent memory impaired and remote memory intact.  Attention and concentration are reduced.  Able to name objects and repeat phrases. Delayed recall 3/5 Cranial nerves: There is good facial symmetry. Extraocular muscles are intact and visual fields are full to confrontational testing. Speech is fluent and clear. No tongue deviation. Hearing is intact to conversational tone. Tone: Tone is good throughout. Sensation: Sensation is intact to light touch. Vibration  is intact at the bilateral big toe.  Coordination: The patient has no difficulty with RAM's or FNF bilaterally. Normal finger to nose  Motor: Strength is 5/5 in the bilateral upper and lower extremities. There is no pronator drift. There are no fasciculations noted. DTR's: Deep tendon reflexes are 2/4 bilaterally. Gait and Station: The patient is able to ambulate without difficulty The patient is able to heel toe walk. Gait is cautious and narrow. The patient is able to ambulate in a tandem fashion.       Thank you for allowing us  the opportunity to participate in the care of this nice patient. Please do not hesitate to contact us  for any questions or concerns.   Total time spent on today's visit was 62 minutes dedicated to this patient today, preparing to see patient, examining the patient, ordering tests and/or medications and counseling the patient, documenting clinical information in the EHR or other health record, independently interpreting results and communicating results to the patient/family, discussing treatment and goals, answering patient's questions  and coordinating care.  Cc:  Donley Furth, MD  Tex Filbert 12/24/2023 9:48 AM

## 2023-12-24 NOTE — Patient Instructions (Signed)
 It was a pleasure to see you today at our office.   Recommendations:  Neurocognitive evaluation at our office   MRI of the brain, the radiology office will call you to arrange you appointment   Check labs today    Follow up in 4 months    For psychiatric meds, mood meds: Please have your primary care physician manage these medications.  If you have any severe symptoms of a stroke, or other severe issues such as confusion,severe chills or fever, etc call 911 or go to the ER as you may need to be evaluated further     For assessment of decision of mental capacity and competency:  Call Dr. Laverne Potter, geriatric psychiatrist at [phone]  Counseling regarding caregiver distress, including caregiver depression, anxiety and issues regarding community resources, adult day care programs, adult living facilities, or memory care questions:  please contact your  Primary Doctor's Social Worker   Whom to call: Memory  decline, memory medications: Call our office [phone]    [email or web address]   RECOMMENDATIONS FOR ALL PATIENTS WITH MEMORY PROBLEMS: 1. Continue to exercise (Recommend 30 minutes of walking everyday, or 3 hours every week) 2. Increase social interactions - continue going to [place] and enjoy social gatherings with friends and family 3. Eat healthy, avoid fried foods and eat more fruits and vegetables 4. Maintain adequate blood pressure, blood sugar, and blood cholesterol level. Reducing the risk of stroke and cardiovascular disease also helps promoting better memory. 5. Avoid stressful situations. Live a simple life and avoid aggravations. Organize your time and prepare for the next day in anticipation. 6. Sleep well, avoid any interruptions of sleep and avoid any distractions in the bedroom that may interfere with adequate sleep quality 7. Avoid sugar, avoid sweets as there is a strong link between excessive sugar  intake, diabetes, and cognitive impairment We discussed the Mediterranean diet, which has been shown to help patients reduce the risk of progressive memory disorders and reduces cardiovascular risk. This includes eating fish, eat fruits and green leafy vegetables, nuts like almonds and hazelnuts, walnuts, and also use olive oil. Avoid fast foods and fried foods as much as possible. Avoid sweets and sugar as sugar use has been linked to worsening of memory function.  There is always a concern of gradual progression of memory problems. If this is the case, then we may need to adjust level of care according to patient needs. Support, both to the patient and caregiver, should then be put into place.      You have been referred for a neuropsychological evaluation (i.e., evaluation of memory and thinking abilities). Please bring someone with you to this appointment if possible, as it is helpful for the doctor to hear from both you and another adult who knows you well. Please bring eyeglasses and hearing aids if you wear them.    The evaluation will take approximately 3 hours and has two parts:   The first part is a clinical interview with the neuropsychologist (Dr. Kitty Perkins or Dr. Donavon Fudge). During the interview, the neuropsychologist will speak with you and the individual you brought to the appointment.    The second part of the evaluation is testing with the doctor's technician Bernabe Brew or Burdette Carolin). During the testing, the technician will ask you to remember different types of material, solve problems, and answer some questionnaires. Your family member will not be present for this portion of the evaluation.   Please note: We must reserve several hours of the neuropsychologist's  time and the psychometrician's time for your evaluation appointment. As such, there is a No-Show fee of $100. If you are unable to attend any of your appointments, please contact our office as soon as possible to reschedule.      DRIVING:  Regarding driving, in patients with progressive memory problems, driving will be impaired. We advise to have someone else do the driving if trouble finding directions or if minor accidents are reported. Independent driving assessment is available to determine safety of driving.   If you are interested in the driving assessment, you can contact the following:  [employer] in [place] [phone]  [facility] [phone]  [facility] [phone]  [facility] [phone] or [phone]   FALL PRECAUTIONS: Be cautious when walking. Scan the area for obstacles that may increase the risk of trips and falls. When getting up in the mornings, sit up at the edge of the bed for a few minutes before getting out of bed. Consider elevating the bed at the head end to avoid drop of blood pressure when getting up. Walk always in a well-lit room (use night lights in the walls). Avoid area rugs or power cords from appliances in the middle of the walkways. Use a walker or a cane if necessary and consider physical therapy for balance exercise. Get your eyesight checked regularly.  FINANCIAL OVERSIGHT: Supervision, especially oversight when making financial decisions or transactions is also recommended.  HOME SAFETY: Consider the safety of the kitchen when operating appliances like stoves, microwave oven, and blender. Consider having supervision and share cooking responsibilities until no longer able to participate in those. Accidents with firearms and other hazards in the house should be identified and addressed as well.   ABILITY TO BE LEFT ALONE: If patient is unable to contact 911 operator, consider using LifeLine, or when the need is there, arrange for someone to stay with patients. Smoking is a fire hazard, consider supervision or cessation. Risk of wandering should be assessed by caregiver and if detected at any point, supervision and safe proof  recommendations should be instituted.  MEDICATION SUPERVISION: Inability to self-administer medication needs to be constantly addressed. Implement a mechanism to ensure safe administration of the medications.      Mediterranean Diet A Mediterranean diet refers to food and lifestyle choices that are based on the traditions of countries located on the [employer]. This way of eating has been shown to help prevent certain conditions and improve outcomes for people who have chronic diseases, like kidney disease and heart disease. What are tips for following this plan? Lifestyle  Cook and eat meals together with your family, when possible. Drink enough fluid to keep your urine clear or pale yellow. Be physically active every day. This includes: Aerobic exercise like running or swimming. Leisure activities like gardening, walking, or housework. Get 7-8 hours of sleep each night. If recommended by your health care provider, drink red wine in moderation. This means 1 glass a day for nonpregnant women and 2 glasses a day for men. A glass of wine equals 5 oz (150 mL). Reading food labels  Check the serving size of packaged foods. For foods such as rice and pasta, the serving size refers to the amount of cooked product, not dry. Check the total fat in packaged foods. Avoid foods that have saturated fat or trans fats. Check the ingredients list for added sugars, such as corn syrup. Shopping  At the grocery store, buy most of your food  from the areas near the walls of the store. This includes: Fresh fruits and vegetables (produce). Grains, beans, nuts, and seeds. Some of these may be available in unpackaged forms or large amounts (in bulk). Fresh seafood. Poultry and eggs. Low-fat dairy products. Buy whole ingredients instead of prepackaged foods. Buy fresh fruits and vegetables in-season from local farmers markets. Buy frozen fruits and vegetables in resealable bags. If you do not have  access to quality fresh seafood, buy precooked frozen shrimp or canned fish, such as tuna, salmon, or sardines. Buy small amounts of raw or cooked vegetables, salads, or olives from the deli or salad bar at your store. Stock your pantry so you always have certain foods on hand, such as olive oil, canned tuna, canned tomatoes, rice, pasta, and beans. Cooking  Cook foods with extra-virgin olive oil instead of using butter or other vegetable oils. Have meat as a side dish, and have vegetables or grains as your main dish. This means having meat in small portions or adding small amounts of meat to foods like pasta or stew. Use beans or vegetables instead of meat in common dishes like chili or lasagna. Experiment with different cooking methods. Try roasting or broiling vegetables instead of steaming or sauteing them. Add frozen vegetables to soups, stews, pasta, or rice. Add nuts or seeds for added healthy fat at each meal. You can add these to yogurt, salads, or vegetable dishes. Marinate fish or vegetables using olive oil, lemon juice, garlic, and fresh herbs. Meal planning  Plan to eat 1 vegetarian meal one day each week. Try to work up to 2 vegetarian meals, if possible. Eat seafood 2 or more times a week. Have healthy snacks readily available, such as: Vegetable sticks with hummus. Greek yogurt. Fruit and nut trail mix. Eat balanced meals throughout the week. This includes: Fruit: 2-3 servings a day Vegetables: 4-5 servings a day Low-fat dairy: 2 servings a day Fish, poultry, or lean meat: 1 serving a day Beans and legumes: 2 or more servings a week Nuts and seeds: 1-2 servings a day Whole grains: 6-8 servings a day Extra-virgin olive oil: 3-4 servings a day Limit red meat and sweets to only a few servings a month What are my food choices? Mediterranean diet Recommended Grains: Whole-grain pasta. Brown rice. Bulgar wheat. Polenta. Couscous. Whole-wheat bread. Dwyane Glad. Vegetables: Artichokes. Beets. Broccoli. Cabbage. Carrots. Eggplant. Green beans. Chard. Kale. Spinach. Onions. Leeks. Peas. Squash. Tomatoes. Peppers. Radishes. Fruits: Apples. Apricots. Avocado. Berries. Bananas. Cherries. Dates. Figs. Grapes. Lemons. Melon. Oranges. Peaches. Plums. Pomegranate. Meats and other protein foods: Beans. Almonds. Sunflower seeds. Pine nuts. Peanuts. Cod. Salmon. Scallops. Shrimp. Tuna. Tilapia. Clams. Oysters. Eggs. Dairy: Low-fat milk. Cheese. Greek yogurt. Beverages: Water. Red wine. Herbal tea. Fats and oils: Extra virgin olive oil. Avocado oil. Grape seed oil. Sweets and desserts: [place] yogurt with honey. Baked apples. Poached pears. Trail mix. Seasoning and other foods: Basil. Cilantro. Coriander. Cumin. Mint. Parsley. Sage. Rosemary. Tarragon. Garlic. Oregano. Thyme. Pepper. Balsalmic vinegar. Tahini. Hummus. Tomato sauce. Olives. Mushrooms. Limit these Grains: Prepackaged pasta or rice dishes. Prepackaged cereal with added sugar. Vegetables: Deep fried potatoes (french fries). Fruits: Fruit canned in syrup. Meats and other protein foods: Beef. Pork. Lamb. Poultry with skin. Hot dogs. Helene Loader. Dairy: Ice cream. Sour cream. Whole milk. Beverages: Juice. Sugar-sweetened soft drinks. Beer. Liquor and spirits. Fats and oils: Butter. Canola oil. Vegetable oil. Beef fat (tallow). Lard. Sweets and desserts: Cookies. Cakes. Pies. Candy. Seasoning and other foods: Mayonnaise. Premade  sauces and marinades. The items listed may not be a complete list. Talk with your dietitian about what dietary choices are right for you. Summary The Mediterranean diet includes both food and lifestyle choices. Eat a variety of fresh fruits and vegetables, beans, nuts, seeds, and whole grains. Limit the amount of red meat and sweets that you eat. Talk with your health care provider about whether it is safe for you to drink red wine in moderation. This means 1 glass a day for  nonpregnant women and 2 glasses a day for men. A glass of wine equals 5 oz (150 mL). This information is not intended to replace advice given to you by your health care provider. Make sure you discuss any questions you have with your health care provider. Document Released: 04/03/2016 Document Revised: 05/06/2016 Document Reviewed: 04/03/2016 Elsevier Interactive Patient Education  2017 [employer].

## 2023-12-25 LAB — VITAMIN B12: Vitamin B-12: 558 pg/mL (ref 200–1100)

## 2023-12-25 LAB — TSH: TSH: 1.91 m[IU]/L (ref 0.40–4.50)

## 2023-12-25 NOTE — Progress Notes (Signed)
 Patient advised.

## 2023-12-25 NOTE — Progress Notes (Signed)
 B12 and Thyroid levels are normal.Thanks

## 2023-12-28 ENCOUNTER — Telehealth: Payer: Self-pay | Admitting: Gastroenterology

## 2023-12-28 NOTE — Telephone Encounter (Signed)
 Pt stated that she has been having some abdominal discomfort for over a month now and starting on Friday that she started having diarrhea associated with blood and mucous along with nausea ( no emesis) and a horrible taste in her mouth. Pt stated that she is having around 5-8 very small liquid stools a day. Poor appetite. Pt tolerating liquids and crackers. Pt was scheduled for an office visit for tomorrow on 12/29/2023 at 2:10 PM with Santina Cull PA. Pt made aware. Pt was notified to try imodium AD OTC and a BRAT diet.  Pt verbalized understanding with all questions answered.

## 2023-12-28 NOTE — Telephone Encounter (Signed)
 PT has been experiencing continuous diarrhea over the weekend and would like to discuss her symptoms with a a nurse. Please advise.

## 2023-12-29 ENCOUNTER — Telehealth: Payer: Self-pay

## 2023-12-29 ENCOUNTER — Other Ambulatory Visit (HOSPITAL_COMMUNITY): Payer: Self-pay

## 2023-12-29 ENCOUNTER — Ambulatory Visit (INDEPENDENT_AMBULATORY_CARE_PROVIDER_SITE_OTHER): Admitting: Physician Assistant

## 2023-12-29 ENCOUNTER — Encounter: Payer: Self-pay | Admitting: Physician Assistant

## 2023-12-29 ENCOUNTER — Other Ambulatory Visit (INDEPENDENT_AMBULATORY_CARE_PROVIDER_SITE_OTHER)

## 2023-12-29 VITALS — BP 118/62 | HR 80 | Ht 65.0 in | Wt 141.0 lb

## 2023-12-29 DIAGNOSIS — K5790 Diverticulosis of intestine, part unspecified, without perforation or abscess without bleeding: Secondary | ICD-10-CM | POA: Diagnosis not present

## 2023-12-29 DIAGNOSIS — Z111 Encounter for screening for respiratory tuberculosis: Secondary | ICD-10-CM

## 2023-12-29 DIAGNOSIS — Z1159 Encounter for screening for other viral diseases: Secondary | ICD-10-CM

## 2023-12-29 DIAGNOSIS — N9489 Other specified conditions associated with female genital organs and menstrual cycle: Secondary | ICD-10-CM

## 2023-12-29 DIAGNOSIS — R197 Diarrhea, unspecified: Secondary | ICD-10-CM | POA: Diagnosis not present

## 2023-12-29 DIAGNOSIS — K51019 Ulcerative (chronic) pancolitis with unspecified complications: Secondary | ICD-10-CM

## 2023-12-29 DIAGNOSIS — K602 Anal fissure, unspecified: Secondary | ICD-10-CM | POA: Diagnosis not present

## 2023-12-29 DIAGNOSIS — K5190 Ulcerative colitis, unspecified, without complications: Secondary | ICD-10-CM

## 2023-12-29 DIAGNOSIS — K219 Gastro-esophageal reflux disease without esophagitis: Secondary | ICD-10-CM

## 2023-12-29 LAB — CBC WITH DIFFERENTIAL/PLATELET
Basophils Absolute: 0 10*3/uL (ref 0.0–0.1)
Basophils Relative: 0.5 % (ref 0.0–3.0)
Eosinophils Absolute: 0.1 10*3/uL (ref 0.0–0.7)
Eosinophils Relative: 1.5 % (ref 0.0–5.0)
HCT: 35.9 % — ABNORMAL LOW (ref 36.0–46.0)
Hemoglobin: 12.1 g/dL (ref 12.0–15.0)
Lymphocytes Relative: 21.5 % (ref 12.0–46.0)
Lymphs Abs: 1.1 10*3/uL (ref 0.7–4.0)
MCHC: 33.8 g/dL (ref 30.0–36.0)
MCV: 88.8 fl (ref 78.0–100.0)
Monocytes Absolute: 0.5 10*3/uL (ref 0.1–1.0)
Monocytes Relative: 10.3 % (ref 3.0–12.0)
Neutro Abs: 3.5 10*3/uL (ref 1.4–7.7)
Neutrophils Relative %: 66.2 % (ref 43.0–77.0)
Platelets: 206 10*3/uL (ref 150.0–400.0)
RBC: 4.04 Mil/uL (ref 3.87–5.11)
RDW: 13.9 % (ref 11.5–15.5)
WBC: 5.2 10*3/uL (ref 4.0–10.5)

## 2023-12-29 LAB — HEPATIC FUNCTION PANEL
ALT: 16 U/L (ref 0–35)
AST: 15 U/L (ref 0–37)
Albumin: 4.4 g/dL (ref 3.5–5.2)
Alkaline Phosphatase: 73 U/L (ref 39–117)
Bilirubin, Direct: 0.1 mg/dL (ref 0.0–0.3)
Total Bilirubin: 0.8 mg/dL (ref 0.2–1.2)
Total Protein: 6.8 g/dL (ref 6.0–8.3)

## 2023-12-29 LAB — BASIC METABOLIC PANEL WITH GFR
BUN: 21 mg/dL (ref 6–23)
CO2: 26 meq/L (ref 19–32)
Calcium: 8.9 mg/dL (ref 8.4–10.5)
Chloride: 106 meq/L (ref 96–112)
Creatinine, Ser: 0.9 mg/dL (ref 0.40–1.20)
GFR: 66.23 mL/min (ref >60.00)
Glucose, Bld: 82 mg/dL (ref 70–99)
Potassium: 4 meq/L (ref 3.5–5.1)
Sodium: 140 meq/L (ref 135–145)

## 2023-12-29 LAB — SEDIMENTATION RATE: Sed Rate: 18 mm/h (ref 0–30)

## 2023-12-29 LAB — HIGH SENSITIVITY CRP: CRP, High Sensitivity: 3.28 mg/L (ref 0.000–5.000)

## 2023-12-29 MED ORDER — AMBULATORY NON FORMULARY MEDICATION: 1 refills | Status: DC

## 2023-12-29 MED ORDER — DICYCLOMINE HCL 20 MG PO TABS: 20.0000 mg | ORAL_TABLET | Freq: Three times a day (TID) | ORAL | 0 refills | Status: DC | PRN

## 2023-12-29 NOTE — Telephone Encounter (Signed)
 Pharmacy Patient Advocate Encounter   Received notification from CoverMyMeds that prior authorization for Dicyclomine HCl 20MG  tablets is required/requested.   Insurance verification completed.   The patient is insured through [facility] Medicare .   Per test claim: The current 17 day co-pay is, $16.57.  No PA needed at this time. This test claim was processed through [facility]- copay amounts may vary at other pharmacies due to pharmacy/plan contracts, or as the patient moves through the different stages of their insurance plan.

## 2023-12-29 NOTE — Patient Instructions (Signed)
 Your provider has requested that you go to the basement level for lab work before leaving today. Press "B" on the elevator. The lab is located at the first door on the left as you exit the elevator.  Anal Fissure, Adult  A fissure is a linear defect in the anal mucosa, symptoms include burning, itching, discomfort especially with a bowel movement with associated rectal bleeding.  Risk factors include low fiber diet, chronic constipation and straining. Anal fissures can take a very long time to heal so this will be a 2 to 55-month process.  Treatment for a fissure includes:  -decreasing time in the toilet should not be more than 5 minutes -adding fiber supplement such as Benefiber or Citrucel -increasing water. -There is a lubricating suppository over-the-counter called Calmol-4 you can get from [facility] or from your pharmacy (may have to order) that I want to do twice daily morning and evening for 8 weeks.  This is a 60 to 80% success rate. -I am also going to send in a calcium  channel blocker cream to a compound pharmacy, apply twice daily for 12 weeks.  Diltiazem/lidocaine  3 x daily for 2 months sent to compound pharmacy   Sent this medication to a compound pharmacy:  [facility] [address] [place], [place], [place] [address]  [phone]   Please DO NOT go directly from our office to pick up this medication! Give the pharmacy 1 day to process the prescription. Extra time is required for them to compound your medication.

## 2023-12-29 NOTE — Progress Notes (Addendum)
 12/29/2023 [name] [id number] [birth year]  Referring provider: Donley Furth, MD Primary GI doctor: Dr. Venice Gillis  ASSESSMENT AND PLAN:   Ulcerative Colitis Current symptoms suggest flare or infection, possibly due to recent antibiotic use. Last colonoscopy showed healing.  Diarrhea started 05/02 with diarrhea 7-8 times a day, rectal bleeding, pelvic pressure, nausea, gas Had UTI ABX 3 weeks ago Stool studies and labs will determine if therapy needs advancement. - Order stool studies for infection, if negative can start on prednisone  - Order labs including CRP and sed rate, fecal cal - Continue mesalamine . - Consider advancing therapy if labs show active inflammation, check Hep B antigen and TB Gold - follow up Dr. Venice Gillis 2-3 months  Patient had elevated fecal calprotectin 3000, negative Diatherix. Discussed with Dr. Venice Gillis will increase mesalamine  to 2.4 mg twice daily finish prednisone  taper will set up follow-up with Dr. Venice Gillis to discuss further options.  Rectal Fissure Posterior rectal fissure likely related to ulcerative colitis or straining, causing bleeding and tenderness. - Prescribe fissure cream with numbing and muscle relaxant. - Provide Calmol4 suppositories.  Diverticulosis Diverticulosis noted on CT scan. No current symptoms or evidence of diverticulitis.  Pelvic Congestion Syndrome Enlarged pelvic blood vessels noted on CT scan. No current intervention planned unless symptoms worsen.  Follow-up - Schedule follow-up with Dr. Venice Gillis in 2-3 months for ulcerative colitis.      Patient Care Team: Donley Furth, MD as PCP - General (Family Medicine) Alane Allen Sara E, PA-C (Neurology)  HISTORY OF PRESENT ILLNESS: 68 y.o. female presents for evaluation of UC pancolitis. Last seen in the office on 08/2023.   IBD history: Diagnosed 07/2019 after perfuse rectal bleeding Has been mesalamine  since that time. Last exacerbation 07/2021 with prednisone  maintained  on mesalamine   Last colonoscopy: 11/2021 normal suggestive of complete mucosal healing mild sigmoid diverticulosis recall 3 years 07/2019 colonoscopy moderately active pancolitis normal TI  Last small bowel imaging:   09/11/2023 CT and pelvis with contrast no acute abnormality scattered left-sided diverticulosis no diverticulitis moderate formed stool in the colon prominence of early filling of bilateral gonadal veins and pelvic lateral vessels possible pelvic congestion syndrome Extraintestinal manifestations: erythema nodosum (tender flat nodules) possibly on legs with flat tender/itchy nodules, has scalp itching, no rash Surgical history: no surgery.  Other significant medical history: GERD on pantoprazole  40 mg daily Mucus membranes dry, check sjogren's  Current History Discussed the use of AI scribe software for clinical note transcription with the patient, who gave verbal consent to proceed.  History of Present Illness   [name] is a 68 year old female with ulcerative colitis who presents with diarrhea and abdominal discomfort. She was referred by Dr. Darren Em for evaluation of her gastrointestinal symptoms.  She was diagnosed with ulcerative colitis in December 2020 after multiple ER visits due to significant rectal bleeding. She has been on mesalamine  since diagnosis and has experienced two flares requiring prednisone , the last being in December 2022. Her most recent colonoscopy in 2023 showed healing compared to a 2021 colonoscopy that showed moderately active pancolitis.  In January 2025, a CT scan revealed diverticulosis without diverticulitis and possible pelvic congestion syndrome. She experiences pelvic pressure and discomfort, particularly when taking warm showers, describing a sensation in the lower abdomen that is unusual but not painful.  She experiences intermittent diarrhea with blood, starting last Friday, with episodes occurring five to nine times a day. She also has  a foul taste in her mouth, abdominal pain,  nausea, and excessive gas. She has not had any bowel movements since the weekend. She recently took antibiotics for a UTI about a month ago.  She experiences scalp irritation and occasional red spots on her skin that resemble pimples, which are itchy and take weeks to resolve. She has been on Xanax  for a long time, taking one at night for sleep, and reports dizziness and dry membranes as side effects.  She continues to take pantoprazole  for reflux and reports no issues with swallowing. No significant upper abdominal pain or nighttime symptoms. She has a history of fever blisters associated with upset stomachs or sun exposure.      Recent labs: 09/04/2023 CRP <1.0  11/18/2021 SED RATE 12 10/27/2023 WBC 5.7 HGB 13.1 MCV 89.0 Platelets 202 No recent iron and ferritin.  B12 558 09/04/2023 AST 12 ALT 10 Alkphos 67 TBili 0.7 No recent vitamin D   TB GOLD remote HepBsAG remote TPMT Activity: Remote  IBD Health Care Maintenance: Annual Flu Vaccine -  Pneumococcal Vaccine if receiving immunosuppression: -   TB testing if on anti-TNF, yearly -  Vitamin D  screening -  COVID vaccine  Shingrix    Immunization History  Administered Date(s) Administered   Influenza,inj,Quad PF,6+ Mos 10/13/2018   Influenza-Unspecified 06/08/2019   PFIZER(Purple Top)SARS-COV-2 Vaccination 10/24/2019, 03/25/2020   PNEUMOCOCCAL CONJUGATE-20 02/06/2023   Tdap 09/01/2018   Zoster Recombinant(Shingrix ) 10/13/2018, 01/27/2020    RELEVANT GI HISTORY, LABS, IMAGING:  CBC    Component Value Date/Time   WBC 5.7 10/27/2023 1209   RBC 4.38 10/27/2023 1209   HGB 13.1 10/27/2023 1209   HGB 12.8 11/18/2021 1317   HCT 39.0 10/27/2023 1209   HCT 37.3 11/18/2021 1317   PLT 202 10/27/2023 1209   PLT 264 11/18/2021 1317   MCV 89.0 10/27/2023 1209   MCV 89 11/18/2021 1317   MCH 29.9 10/27/2023 1209   MCHC 33.6 10/27/2023 1209   RDW 13.2 10/27/2023 1209   RDW 12.0 11/18/2021 1317    LYMPHSABS 1.9 02/06/2023 1052   LYMPHSABS 2.0 11/18/2021 1317   MONOABS 0.6 02/06/2023 1052   EOSABS 0.1 02/06/2023 1052   EOSABS 0.0 11/18/2021 1317   BASOSABS 0.1 02/06/2023 1052   BASOSABS 0.1 11/18/2021 1317   Recent Labs    02/06/23 1052 09/04/23 1453 10/27/23 1209  HGB 13.2 13.2 13.1    CMP     Component Value Date/Time   NA 137 10/27/2023 1209   NA 145 (H) 11/18/2021 1317   K 4.3 10/27/2023 1209   CL 106 10/27/2023 1209   CO2 22 10/27/2023 1209   GLUCOSE 103 (H) 10/27/2023 1209   BUN 16 10/27/2023 1209   BUN 13 11/18/2021 1317   CREATININE 0.81 10/27/2023 1209   CREATININE 0.77 11/28/2012 1506   CALCIUM  9.1 10/27/2023 1209   PROT 7.1 09/04/2023 1453   PROT 7.3 11/18/2021 1317   ALBUMIN 4.5 09/04/2023 1453   ALBUMIN 4.9 (H) 11/18/2021 1317   AST 12 09/04/2023 1453   ALT 10 09/04/2023 1453   ALKPHOS 67 09/04/2023 1453   BILITOT 0.7 09/04/2023 1453   BILITOT 0.2 11/18/2021 1317   GFRNONAA >60 10/27/2023 1209   GFRAA >60 08/17/2019 0556      Latest Ref Rng & Units 09/04/2023    2:53 PM 02/06/2023   10:52 AM 11/18/2021    1:17 PM  Hepatic Function  Total Protein 6.0 - 8.3 g/dL 7.1  6.9  7.3   Albumin 3.5 - 5.2 g/dL 4.5  4.3  4.9  AST 0 - 37 U/L 12  18  13    ALT 0 - 35 U/L 10  12  12    Alk Phosphatase 39 - 117 U/L 67  63  84   Total Bilirubin 0.2 - 1.2 mg/dL 0.7  0.7  0.2   Bilirubin, Direct 0.0 - 0.3 mg/dL  0.1        Current Medications:    Current Outpatient Medications (Cardiovascular):    atorvastatin  (LIPITOR) 10 MG tablet, Take 1 tablet (10 mg total) by mouth daily.   losartan  (COZAAR ) 50 MG tablet, Take 1 tablet (50 mg total) by mouth daily.     Current Outpatient Medications (Other):    ALPRAZolam  (XANAX ) 0.5 MG tablet, TAKE 1 TABLET(0.5 MG) BY MOUTH THREE TIMES DAILY AS NEEDED FOR ANXIETY OR SLEEP   AMBULATORY NON FORMULARY MEDICATION, Medication Name: Diltiazem 2%/Lidocaine  2%   Using your index finger apply a small amount of  medication inside the anal opening and to the external anal area twice daily x 6 weeks.   buPROPion  (WELLBUTRIN  XL) 300 MG 24 hr tablet, Take 1 tablet (300 mg total) by mouth daily.   dicyclomine  (BENTYL ) 20 MG tablet, Take 1 tablet (20 mg total) by mouth 3 (three) times daily as needed for spasms.   mesalamine  (LIALDA ) 1.2 g EC tablet, Take 2 tablets (2.4 g total) by mouth daily with breakfast.   Multiple Vitamin (MULTIVITAMIN) tablet, Take 1 tablet by mouth daily.   ondansetron  (ZOFRAN -ODT) 4 MG disintegrating tablet, Take 1 tablet (4 mg total) by mouth every 8 (eight) hours as needed for nausea or vomiting.   pantoprazole  (PROTONIX ) 40 MG tablet, Take 1 tablet (40 mg total) by mouth daily.   sertraline  (ZOLOFT ) 100 MG tablet, TAKE 1 TABLET(100 MG) BY MOUTH DAILY  Medical History:  Past Medical History:  Diagnosis Date   Anxiety    Clostridioides difficile infection    Depression    High blood cholesterol    Migraine    Palpitations    Pancreatitis    Ulcerative colitis (HCC)    Allergies: No Known Allergies   Surgical History:  She  has a past surgical history that includes Tonsillectomy; Appendectomy (Right); Esophagogastroduodenoscopy (N/A, 08/18/2019); Colonoscopy (N/A, 08/18/2019); biopsy (08/18/2019); polypectomy (08/18/2019); and Colonoscopy (12/12/2021). Family History:  Her family history includes Hypertension in her mother; Liver disease in her brother and brother; Prostate cancer in her father.  REVIEW OF SYSTEMS  : All other systems reviewed and negative except where noted in the History of Present Illness.  PHYSICAL EXAM: BP 118/62   Pulse 80   Ht 5\' 5"  (1.651 m)   Wt 141 lb (64 kg)   BMI 23.46 kg/m  Physical Exam   GENERAL APPEARANCE: Well nourished, in no apparent distress. HEENT: No cervical lymphadenopathy, unremarkable thyroid, sclerae anicteric, conjunctiva pink. RESPIRATORY: Respiratory effort normal, breath sounds equal bilaterally without rales,  rhonchi, or wheezing. Lungs clear to auscultation bilaterally. CARDIO: Regular rate and rhythm with no murmurs, rubs, or gallops, peripheral pulses intact. ABDOMEN: Soft, non-distended, active bowel sounds in all four quadrants, non-tender to palpation, no rebound, no mass appreciated. RECTAL: Posterior rectal fissure present. Stool light brown with mucus and some blood. No rectal abscesses. MUSCULOSKELETAL: Full range of motion, normal gait, without edema. SKIN: Dry, intact without rashes or lesions. No jaundice. NEURO: Alert, oriented, no focal deficits. PSYCH: Cooperative, normal mood and affect.        Edmonia Gottron, PA-C 3:55 PM

## 2023-12-30 ENCOUNTER — Other Ambulatory Visit

## 2023-12-30 DIAGNOSIS — R197 Diarrhea, unspecified: Secondary | ICD-10-CM

## 2023-12-30 DIAGNOSIS — K51019 Ulcerative (chronic) pancolitis with unspecified complications: Secondary | ICD-10-CM

## 2023-12-31 ENCOUNTER — Encounter: Payer: Self-pay | Admitting: Physician Assistant

## 2023-12-31 LAB — HEPATITIS B SURFACE ANTIGEN: Hepatitis B Surface Ag: NONREACTIVE

## 2024-01-01 ENCOUNTER — Ambulatory Visit (INDEPENDENT_AMBULATORY_CARE_PROVIDER_SITE_OTHER)

## 2024-01-01 ENCOUNTER — Other Ambulatory Visit: Payer: Self-pay

## 2024-01-01 ENCOUNTER — Telehealth: Payer: Self-pay | Admitting: Physician Assistant

## 2024-01-01 DIAGNOSIS — R197 Diarrhea, unspecified: Secondary | ICD-10-CM

## 2024-01-01 DIAGNOSIS — K51019 Ulcerative (chronic) pancolitis with unspecified complications: Secondary | ICD-10-CM

## 2024-01-01 LAB — QUANTIFERON-TB GOLD PLUS
Mitogen-NIL: >10 [IU]/mL
NIL: 0.02 [IU]/mL
QuantiFERON-TB Gold Plus: NEGATIVE
TB1-NIL: <0 [IU]/mL
TB2-NIL: 0 [IU]/mL

## 2024-01-01 LAB — IBC + FERRITIN
Ferritin: 139 ng/mL (ref 10.0–291.0)
Iron: 70 ug/dL (ref 42–145)
Saturation Ratios: 20.7 % (ref 20.0–50.0)
TIBC: 338.8 ug/dL (ref 250.0–450.0)
Transferrin: 242 mg/dL (ref 212.0–360.0)

## 2024-01-01 NOTE — Telephone Encounter (Signed)
 Patient has negative GI pathogen and C. difficile. Sent MyChart message.

## 2024-01-05 LAB — CALPROTECTIN: Calprotectin: 6040 ug/g — ABNORMAL HIGH

## 2024-01-05 MED ORDER — PREDNISONE 10 MG PO TABS: ORAL_TABLET | ORAL | 0 refills | Status: DC

## 2024-01-05 NOTE — Telephone Encounter (Signed)
 Patient had negative GI pathogen and C. difficile, continues to have diarrhea. Will send in prednisone  taper for patient to take, may need to consider advancement of therapy however CRP and sed rate unremarkable, pending fecal calprotectin. May need to consider repeat colonoscopy.

## 2024-01-05 NOTE — Addendum Note (Signed)
 Addended by: Santina Cull on: 01/05/2024 08:27 AM   Modules accepted: Orders

## 2024-01-06 ENCOUNTER — Ambulatory Visit: Payer: Self-pay | Admitting: Physician Assistant

## 2024-01-07 MED ORDER — MESALAMINE 1.2 G PO TBEC: 2.4000 g | DELAYED_RELEASE_TABLET | Freq: Two times a day (BID) | ORAL | 1 refills | Status: DC

## 2024-01-08 ENCOUNTER — Encounter: Payer: Self-pay | Admitting: Physician Assistant

## 2024-01-09 ENCOUNTER — Other Ambulatory Visit

## 2024-01-12 ENCOUNTER — Ambulatory Visit
Admission: RE | Admit: 2024-01-12 | Discharge: 2024-01-12 | Disposition: A | Discharge status: Home / Self Care | Source: Ambulatory Visit | Attending: Physician Assistant | Admitting: Physician Assistant

## 2024-01-12 DIAGNOSIS — R413 Other amnesia: Secondary | ICD-10-CM

## 2024-01-14 ENCOUNTER — Ambulatory Visit

## 2024-01-14 ENCOUNTER — Ambulatory Visit: Admitting: Physician Assistant

## 2024-01-15 ENCOUNTER — Telehealth: Payer: Self-pay

## 2024-01-15 ENCOUNTER — Other Ambulatory Visit: Payer: Self-pay | Admitting: Family Medicine

## 2024-01-15 ENCOUNTER — Ambulatory Visit: Payer: Self-pay | Admitting: Physician Assistant

## 2024-01-15 NOTE — Telephone Encounter (Signed)
 Called and reported results per Tex Filbert PA-C. She understood. It was also My Charted to her.

## 2024-01-19 ENCOUNTER — Other Ambulatory Visit: Payer: Self-pay | Admitting: Family Medicine

## 2024-01-19 DIAGNOSIS — F330 Major depressive disorder, recurrent, mild: Secondary | ICD-10-CM

## 2024-01-22 ENCOUNTER — Ambulatory Visit: Admitting: Physician Assistant

## 2024-01-22 ENCOUNTER — Ambulatory Visit

## 2024-02-03 ENCOUNTER — Ambulatory Visit: Attending: Cardiovascular Disease | Admitting: Cardiovascular Disease

## 2024-02-03 ENCOUNTER — Encounter: Payer: Self-pay | Admitting: Cardiovascular Disease

## 2024-02-03 ENCOUNTER — Ambulatory Visit

## 2024-02-03 VITALS — BP 130/82 | HR 64 | Ht 65.0 in | Wt 138.0 lb

## 2024-02-03 DIAGNOSIS — Z720 Tobacco use: Secondary | ICD-10-CM

## 2024-02-03 DIAGNOSIS — E785 Hyperlipidemia, unspecified: Secondary | ICD-10-CM

## 2024-02-03 DIAGNOSIS — Z8679 Personal history of other diseases of the circulatory system: Secondary | ICD-10-CM

## 2024-02-03 DIAGNOSIS — I10 Essential (primary) hypertension: Secondary | ICD-10-CM

## 2024-02-03 DIAGNOSIS — R0789 Other chest pain: Secondary | ICD-10-CM | POA: Diagnosis not present

## 2024-02-03 DIAGNOSIS — I700 Atherosclerosis of aorta: Secondary | ICD-10-CM

## 2024-02-03 NOTE — Assessment & Plan Note (Signed)
 History of palpitations in the morning over the last 6 months.  She also complains of some dizziness.  I am going to get a 2-week Zio patch to further evaluate.

## 2024-02-03 NOTE — Assessment & Plan Note (Signed)
 Recently discontinued tobacco abuse 3/25.  She did smoke for 25 years.

## 2024-02-03 NOTE — Progress Notes (Unsigned)
 Enrolled patient for a 14 day Zio XT  monitor to be mailed to patients home

## 2024-02-03 NOTE — Assessment & Plan Note (Signed)
 History of essential hypertension with blood pressure measured today at 130/82.  She is on losartan .

## 2024-02-03 NOTE — Progress Notes (Signed)
 02/03/2024 [name]   06-20-1956  [id number]  Primary Physician Paige Furth, MD Primary Cardiologist: Paige Leigh MD [name], [name]  HPI:  [name] is a 68 y.o. thin-appearing married Caucasian female mother of 3 children, grandmother of 3 grandchildren is accompanied by her her husband [name] today.  She was referred by Dr. Alyne Spears, her PCP, for evaluation of atypical chest pain.  Risk factors include recently discontinued tobacco use 3/25 having smoked 10 to 15 pack years.  She has treated hypertension and hyperlipidemia.  There is no family history of heart disease.  She is never had heart attack or stroke.  She is not very active.  She does have ulcerative colitis.  She does complain of palpitations in the morning of last 6 months of some dizziness.  She was seen in the ER 3//25 prolonged chest pain which resolved spontaneously.  Her workup was unrevealing.  Chest CT did show aortic atherosclerosis.   Current Meds  Medication Sig   ALPRAZolam  (XANAX ) 0.5 MG tablet TAKE 1 TABLET(0.5 MG) BY MOUTH THREE TIMES DAILY AS NEEDED FOR ANXIETY OR SLEEP   AMBULATORY NON FORMULARY MEDICATION Medication Name: Diltiazem 2%/Lidocaine  2%   Using your index finger apply a small amount of medication inside the anal opening and to the external anal area twice daily x 6 weeks.   atorvastatin  (LIPITOR) 10 MG tablet Take 1 tablet (10 mg total) by mouth daily.   buPROPion  (WELLBUTRIN  XL) 300 MG 24 hr tablet TAKE 1 TABLET(300 MG) BY MOUTH IN THE MORNING   dicyclomine  (BENTYL ) 20 MG tablet Take 1 tablet (20 mg total) by mouth 3 (three) times daily as needed for spasms.   losartan  (COZAAR ) 50 MG tablet Take 1 tablet (50 mg total) by mouth daily.   mesalamine  (LIALDA ) 1.2 g EC tablet Take 2 tablets (2.4 g total) by mouth 2 (two) times daily.   Multiple Vitamin (MULTIVITAMIN) tablet Take 1 tablet by mouth daily.   ondansetron  (ZOFRAN -ODT) 4 MG disintegrating tablet Take 1 tablet (4 mg  total) by mouth every 8 (eight) hours as needed for nausea or vomiting.   pantoprazole  (PROTONIX ) 40 MG tablet Take 1 tablet (40 mg total) by mouth daily.   predniSONE  (DELTASONE ) 10 MG tablet Take 4 tablets (40 mg total) by mouth daily with breakfast for 7 days, THEN 3 tablets (30 mg total) daily with breakfast for 7 days, THEN 2 tablets (20 mg total) daily with breakfast for 7 days, THEN 1 tablet (10 mg total) daily with breakfast for 7 days, THEN 0.5 tablets (5 mg total) daily with breakfast for 8 days.   sertraline  (ZOLOFT ) 100 MG tablet TAKE 1 TABLET(100 MG) BY MOUTH DAILY     No Known Allergies  Social History   Socioeconomic History   Marital status: Married    Spouse name: Not on file   Number of children: 3   Years of education: Not on file   Highest education level: Associate degree: academic program  Occupational History   Occupation: insurance  Tobacco Use   Smoking status: Former    Current packs/day: 0.50    Types: Cigarettes   Smokeless tobacco: Never  Vaping Use   Vaping status: Never Used  Substance and Sexual Activity   Alcohol use: Yes    Comment: socially   Drug use: Yes    Types: Marijuana, Other-see comments    Comment: Gummies CBD   Sexual activity: Not on file  Other  Topics Concern   Not on file  Social History Narrative   Divorced3 children   Right handed   Social Drivers of Health   Financial Resource Strain: Low Risk  (08/24/2023)   Overall Financial Resource Strain (CARDIA)    Difficulty of Paying Living Expenses: Not hard at all  Food Insecurity: No Food Insecurity (08/24/2023)   Hunger Vital Sign    Worried About Running Out of Food in the Last Year: Never true    Ran Out of Food in the Last Year: Never true  Transportation Needs: No Transportation Needs (08/24/2023)   PRAPARE - Administrator, Civil Service (Medical): No    Lack of Transportation (Non-Medical): No  Physical Activity: Insufficiently Active (08/24/2023)    Exercise Vital Sign    Days of Exercise per Week: 2 days    Minutes of Exercise per Session: 20 min  Stress: Stress Concern Present (08/24/2023)   [name] [employer] - Occupational Stress Questionnaire    Feeling of Stress : Rather much  Social Connections: Moderately Isolated (08/24/2023)   Social Connection and Isolation Panel [NHANES]    Frequency of Communication with Friends and Family: More than three times a week    Frequency of Social Gatherings with Friends and Family: Twice a week    Attends Religious Services: Never    Database administrator or Organizations: No    Attends Engineer, structural: Not on file    Marital Status: Married  Intimate Partner Violence: Unknown (11/28/2021)   Received from [employer], [facility]   HITS    Physically Hurt: Not on file    Insult or Talk Down To: Not on file    Threaten Physical Harm: Not on file    Scream or Curse: Not on file     Review of Systems: General: negative for chills, fever, night sweats or weight changes.  Cardiovascular: negative for chest pain, dyspnea on exertion, edema, orthopnea, palpitations, paroxysmal nocturnal dyspnea or shortness of breath Dermatological: negative for rash Respiratory: negative for cough or wheezing Urologic: negative for hematuria Abdominal: negative for nausea, vomiting, diarrhea, bright red blood per rectum, melena, or hematemesis Neurologic: negative for visual changes, syncope, or dizziness All other systems reviewed and are otherwise negative except as noted above.    Blood pressure 130/82, pulse 64, height 5' 5 (1.651 m), weight 138 lb (62.6 kg), SpO2 97%.  General appearance: alert and no distress Neck: no adenopathy, no carotid bruit, no JVD, supple, symmetrical, trachea midline, and thyroid not enlarged, symmetric, no tenderness/mass/nodules Lungs: clear to auscultation bilaterally Heart: regular rate and rhythm, S1, S2 normal, no murmur, click,  rub or gallop Extremities: extremities normal, atraumatic, no cyanosis or edema Pulses: 2+ and symmetric Skin: Skin color, texture, turgor normal. No rashes or lesions Neurologic: Grossly normal  EKG EKG Interpretation Date/Time:  Wednesday February 03 2024 10:55:05 EDT Ventricular Rate:  64 PR Interval:  132 QRS Duration:  74 QT Interval:  388 QTC Calculation: 400 R Axis:   87  Text Interpretation: Normal sinus rhythm Normal ECG When compared with ECG of 27-Oct-2023 11:45, PREVIOUS ECG IS PRESENT Confirmed by Lauro Portal [phone]) on 02/03/2024 11:00:58 AM    ASSESSMENT AND PLAN:   PALPITATIONS, HX OF History of palpitations in the morning over the last 6 months.  She also complains of some dizziness.  I am going to get a 2-week Zio patch to further evaluate.  Tobacco abuse Recently discontinued tobacco abuse 3/25.  She did smoke for 25 years.  Dyslipidemia History of dyslipidemia on low-dose atorvastatin  lipid profile performed 02/06/2023 revealing a total cholesterol 214, LDL 138 and HDL of 62.  Will recheck a lipid liver profile.  Primary hypertension History of essential hypertension with blood pressure measured today at 130/82.  She is on losartan .  Atypical chest pain Episode of atypical chest pain 3//25.  She was seen at bedside at [facility].  Her evaluation was unrevealing.  Chest CT showed no pulmonary embolus but did show aortic atherosclerosis.  She had no PE.  Her troponins were negative and her EKG showed no acute changes Katheryne Pane she does have positive cardiac risk factors.  She has had no subsequent symptoms.  Going to get a 2D echo and a coronary calcium  score to further evaluate.     Paige Leigh MD FACP,FACC,FAHA, [facility] 02/03/2024 11:19 AM

## 2024-02-03 NOTE — Assessment & Plan Note (Signed)
 Episode of atypical chest pain 3//25.  She was seen at bedside at [facility].  Her evaluation was unrevealing.  Chest CT showed no pulmonary embolus but did show aortic atherosclerosis.  She had no PE.  Her troponins were negative and her EKG showed no acute changes [name] she does have positive cardiac risk factors.  She has had no subsequent symptoms.  Going to get a 2D echo and a coronary calcium  score to further evaluate.

## 2024-02-03 NOTE — Patient Instructions (Signed)
 Medication Instructions:  Your physician recommends that you continue on your current medications as directed. Please refer to the Current Medication list given to you today.  *If you need a refill on your cardiac medications before your next appointment, please call your pharmacy*  Lab Work: Your physician recommends that you have labs drawn today: Lipid/liver panel  If you have labs (blood work) drawn today and your tests are completely normal, you will receive your results only by: MyChart Message (if you have MyChart) OR A paper copy in the mail If you have any lab test that is abnormal or we need to change your treatment, we will call you to review the results.  Testing/Procedures: Your physician has requested that you have an echocardiogram. Echocardiography is a painless test that uses sound waves to create images of your heart. It provides your doctor with information about the size and shape of your heart and how well your heart's chambers and valves are working. This procedure takes approximately one hour. There are no restrictions for this procedure. Please do NOT wear cologne, perfume, aftershave, or lotions (deodorant is allowed). Please arrive 15 minutes prior to your appointment time.  Please note: We ask at that you not bring children with you during ultrasound (echo/ vascular) testing. Due to room size and safety concerns, children are not allowed in the ultrasound rooms during exams. Our front office staff cannot provide observation of children in our lobby area while testing is being conducted. An adult accompanying a patient to their appointment will only be allowed in the ultrasound room at the discretion of the ultrasound technician under special circumstances. We apologize for any inconvenience.   Dr. Katheryne Pane has ordered a CT coronary calcium  score.   Test locations:  [facility]  [facility]  This is $99 out of pocket.  If you would like to call to get scheduled - [phone]  Coronary CalciumScan A coronary calcium  scan is an imaging test used to look for deposits of calcium  and other fatty materials (plaques) in the inner lining of the blood vessels of the heart (coronary arteries). These deposits of calcium  and plaques can partly clog and narrow the coronary arteries without producing any symptoms or warning signs. This puts a person at risk for a heart attack. This test can detect these deposits before symptoms develop. Tell a health care provider about: Any allergies you have. All medicines you are taking, including vitamins, herbs, eye drops, creams, and over-the-counter medicines. Any problems you or family members have had with anesthetic medicines. Any blood disorders you have. Any surgeries you have had. Any medical conditions you have. Whether you are pregnant or may be pregnant. What are the risks? Generally, this is a safe procedure. However, problems may occur, including: Harm to a pregnant woman and her unborn baby. This test involves the use of radiation. Radiation exposure can be dangerous to a pregnant woman and her unborn baby. If you are pregnant, you generally should not have this procedure done. Slight increase in the risk of cancer. This is because of the radiation involved in the test. What happens before the procedure? No preparation is needed for this procedure. What happens during the procedure? You will undress and remove any jewelry around your neck or chest. You will put on a hospital gown. Sticky electrodes will be placed on your chest. The electrodes will be connected to an electrocardiogram (ECG) machine to  record a tracing of the electrical activity of your heart. A CT scanner will take pictures of your heart. During this time, you will be asked to lie still and hold your breath for 2-3 seconds while a picture of your heart is  being taken. The procedure may vary among health care providers and hospitals. What happens after the procedure? You can get dressed. You can return to your normal activities. It is up to you to get the results of your test. Ask your health care provider, or the department that is doing the test, when your results will be ready. Summary A coronary calcium  scan is an imaging test used to look for deposits of calcium  and other fatty materials (plaques) in the inner lining of the blood vessels of the heart (coronary arteries). Generally, this is a safe procedure. Tell your health care provider if you are pregnant or may be pregnant. No preparation is needed for this procedure. A CT scanner will take pictures of your heart. You can return to your normal activities after the scan is done. This information is not intended to replace advice given to you by your health care provider. Make sure you discuss any questions you have with your health care provider. Document Released: 02/07/2008 Document Revised: 06/30/2016 Document Reviewed: 06/30/2016 Elsevier Interactive Patient Education  2017 Elsevier Inc.   Delane Fear- Long Term Monitor Instructions  Your physician has requested you wear a ZIO patch monitor for 14 days.  This is a single patch monitor. [employer] supplies one patch monitor per enrollment. Additional stickers are not available. Please do not apply patch if you will be having a Nuclear Stress Test,  Echocardiogram, Cardiac CT, MRI, or Chest Xray during the period you would be wearing the  monitor. The patch cannot be worn during these tests. You cannot remove and re-apply the  ZIO XT patch monitor.  Your ZIO patch monitor will be mailed 3 day USPS to your address on file. It may take 3-5 days  to receive your monitor after you have been enrolled.  Once you have received your monitor, please review the enclosed instructions. Your monitor  has already been registered assigning a specific  monitor serial # to you.  Billing and Patient Assistance Program Information  We have supplied [employer] with any of your insurance information on file for billing purposes. [employer] offers a sliding scale Patient Assistance Program for patients that do not have  insurance, or whose insurance does not completely cover the cost of the ZIO monitor.  You must apply for the Patient Assistance Program to qualify for this discounted rate.  To apply, please call [employer] at [phone], select option 4, select option 2, ask to apply for  Patient Assistance Program. Sanna Crystal will ask your household income, and how many people  are in your household. They will quote your out-of-pocket cost based on that information.  [employer] will also be able to set up a 33-month, interest-free payment plan if needed.  Applying the monitor   Shave hair from upper left chest.  Hold abrader disc by orange tab. Rub abrader in 40 strokes over the upper left chest as  indicated in your monitor instructions.  Clean area with 4 enclosed alcohol pads. Let dry.  Apply patch as indicated in monitor instructions. Patch will be placed under collarbone on left  side of chest with arrow pointing upward.  Rub patch adhesive wings for 2 minutes. Remove white label marked 1. Remove the white  label marked  2. Rub patch adhesive wings for 2 additional minutes.  While looking in a mirror, press and release button in center of patch. A small green light will  flash 3-4 times. This will be your only indicator that the monitor has been turned on.  Do not shower for the first 24 hours. You may shower after the first 24 hours.  Press the button if you feel a symptom. You will hear a small click. Record Date, Time and  Symptom in the Patient Logbook.  When you are ready to remove the patch, follow instructions on the last 2 pages of Patient  Logbook. Stick patch monitor onto the last page of Patient Logbook.  Place Patient Logbook in the  blue and white box. Use locking tab on box and tape box closed  securely. The blue and white box has prepaid postage on it. Please place it in the mailbox as  soon as possible. Your physician should have your test results approximately 7 days after the  monitor has been mailed back to [facility].  Call [facility] Customer Care at [phone] if you have questions regarding  your ZIO XT patch monitor. Call them immediately if you see an orange light blinking on your  monitor.  If your monitor falls off in less than 4 days, contact our Monitor department at [phone].  If your monitor becomes loose or falls off after 4 days call [employer] at [phone] for  suggestions on securing your monitor   Follow-Up: At [facility], you and your health needs are our priority.  As part of our continuing mission to provide you with exceptional heart care, our providers are all part of one team.  This team includes your primary Cardiologist (physician) and Advanced Practice Providers or APPs (Physician Assistants and Nurse Practitioners) who all work together to provide you with the care you need, when you need it.  Your next appointment:   3 month(s)  Provider:   Lauro Portal, MD    We recommend signing up for the patient portal called MyChart.  Sign up information is provided on this After Visit Summary.  MyChart is used to connect with patients for Virtual Visits (Telemedicine).  Patients are able to view lab/test results, encounter notes, upcoming appointments, etc.  Non-urgent messages can be sent to your provider as well.   To learn more about what you can do with MyChart, go to [email or web address].

## 2024-02-03 NOTE — Assessment & Plan Note (Signed)
 History of dyslipidemia on low-dose atorvastatin  lipid profile performed 02/06/2023 revealing a total cholesterol 214, LDL 138 and HDL of 62.  Will recheck a lipid liver profile.

## 2024-02-04 ENCOUNTER — Ambulatory Visit: Payer: Self-pay | Admitting: Cardiovascular Disease

## 2024-02-04 ENCOUNTER — Ambulatory Visit (HOSPITAL_BASED_OUTPATIENT_CLINIC_OR_DEPARTMENT_OTHER)
Admission: RE | Admit: 2024-02-04 | Discharge: 2024-02-04 | Disposition: A | Discharge status: Home / Self Care | Payer: Self-pay | Source: Ambulatory Visit | Attending: Cardiovascular Disease | Admitting: Cardiovascular Disease

## 2024-02-04 DIAGNOSIS — I700 Atherosclerosis of aorta: Secondary | ICD-10-CM

## 2024-02-04 DIAGNOSIS — E785 Hyperlipidemia, unspecified: Secondary | ICD-10-CM

## 2024-02-04 DIAGNOSIS — I10 Essential (primary) hypertension: Secondary | ICD-10-CM | POA: Insufficient documentation

## 2024-02-04 DIAGNOSIS — Z8679 Personal history of other diseases of the circulatory system: Secondary | ICD-10-CM | POA: Insufficient documentation

## 2024-02-04 DIAGNOSIS — R0789 Other chest pain: Secondary | ICD-10-CM | POA: Insufficient documentation

## 2024-02-04 DIAGNOSIS — Z720 Tobacco use: Secondary | ICD-10-CM | POA: Insufficient documentation

## 2024-02-04 LAB — HEPATIC FUNCTION PANEL
ALT: 20 IU/L (ref 0–32)
AST: 18 IU/L (ref 0–40)
Albumin: 4.5 g/dL (ref 3.9–4.9)
Alkaline Phosphatase: 83 IU/L (ref 44–121)
Bilirubin Total: 0.7 mg/dL (ref 0.0–1.2)
Bilirubin, Direct: 0.24 mg/dL (ref 0.00–0.40)
Total Protein: 6.7 g/dL (ref 6.0–8.5)

## 2024-02-04 LAB — LIPID PANEL
Chol/HDL Ratio: 2.7 ratio (ref 0.0–4.4)
Cholesterol, Total: 232 mg/dL — ABNORMAL HIGH (ref 100–199)
HDL: 85 mg/dL (ref >39)
LDL Chol Calc (NIH): 126 mg/dL — ABNORMAL HIGH (ref 0–99)
Triglycerides: 121 mg/dL (ref 0–149)
VLDL Cholesterol Cal: 21 mg/dL (ref 5–40)

## 2024-02-08 ENCOUNTER — Ambulatory Visit (INDEPENDENT_AMBULATORY_CARE_PROVIDER_SITE_OTHER): Admitting: Family Medicine

## 2024-02-08 ENCOUNTER — Encounter: Payer: Self-pay | Admitting: Family Medicine

## 2024-02-08 VITALS — BP 118/72 | HR 61 | Temp 98.3°F | Ht 65.0 in | Wt 138.2 lb

## 2024-02-08 DIAGNOSIS — I10 Essential (primary) hypertension: Secondary | ICD-10-CM

## 2024-02-08 DIAGNOSIS — R413 Other amnesia: Secondary | ICD-10-CM | POA: Diagnosis not present

## 2024-02-08 DIAGNOSIS — K529 Noninfective gastroenteritis and colitis, unspecified: Secondary | ICD-10-CM

## 2024-02-08 DIAGNOSIS — G4700 Insomnia, unspecified: Secondary | ICD-10-CM | POA: Diagnosis not present

## 2024-02-08 DIAGNOSIS — F418 Other specified anxiety disorders: Secondary | ICD-10-CM

## 2024-02-08 DIAGNOSIS — R42 Dizziness and giddiness: Secondary | ICD-10-CM | POA: Diagnosis not present

## 2024-02-08 DIAGNOSIS — M810 Age-related osteoporosis without current pathological fracture: Secondary | ICD-10-CM

## 2024-02-08 DIAGNOSIS — K2100 Gastro-esophageal reflux disease with esophagitis, without bleeding: Secondary | ICD-10-CM

## 2024-02-08 DIAGNOSIS — R739 Hyperglycemia, unspecified: Secondary | ICD-10-CM

## 2024-02-08 DIAGNOSIS — E785 Hyperlipidemia, unspecified: Secondary | ICD-10-CM

## 2024-02-08 NOTE — Progress Notes (Signed)
 Subjective:    Patient ID: [name], female    DOB: 1955-08-31, 68 y.o.   MRN: [id number]  HPI Here to follow up on issues. She feels well in general except for frequent bouts of dizziness. This only happens when she quickly looks up above her head or when she lies down in bed. It feels like the room spins, but this stops after about 30 seconds. She recently saw Dr. Katheryne Pane for a cariology evaluation. This was prompted by an episode of atypical chest pain, and she has had no more pains since then. She had a cardiac calcium  score of 6.59. Her LDL was 126. She wore a Zio patch for 10 days, and the interpretation is still pending. We have talked to her about her memory difficulties, and she is scheduled to see Neurology on 05-04-24. Her HTN is stable. Her GERD is stable. Her anxiety and depression are stable.    Review of Systems  Constitutional: Negative.   HENT: Negative.    Eyes: Negative.   Respiratory: Negative.    Cardiovascular: Negative.   Gastrointestinal: Negative.   Genitourinary:  Negative for decreased urine volume, difficulty urinating, dyspareunia, dysuria, enuresis, flank pain, frequency, hematuria, pelvic pain and urgency.  Musculoskeletal: Negative.   Skin: Negative.   Neurological:  Positive for dizziness. Negative for headaches.  Psychiatric/Behavioral: Negative.         Objective:   Physical Exam Constitutional:      General: She is not in acute distress.    Appearance: Normal appearance. She is well-developed.  HENT:     Head: Normocephalic and atraumatic.     Right Ear: External ear normal.     Left Ear: External ear normal.     Nose: Nose normal.     Mouth/Throat:     Pharynx: No oropharyngeal exudate.   Eyes:     General: No scleral icterus.    Conjunctiva/sclera: Conjunctivae normal.     Pupils: Pupils are equal, round, and reactive to light.   Neck:     Thyroid: No thyromegaly.     Vascular: No JVD.   Cardiovascular:     Rate and Rhythm:  Normal rate and regular rhythm.     Pulses: Normal pulses.     Heart sounds: Normal heart sounds. No murmur heard.    No friction rub. No gallop.  Pulmonary:     Effort: Pulmonary effort is normal. No respiratory distress.     Breath sounds: Normal breath sounds. No wheezing or rales.  Chest:     Chest wall: No tenderness.  Abdominal:     General: Bowel sounds are normal. There is no distension.     Palpations: Abdomen is soft. There is no mass.     Tenderness: There is no abdominal tenderness. There is no guarding or rebound.   Musculoskeletal:        General: No tenderness. Normal range of motion.     Cervical back: Normal range of motion and neck supple.  Lymphadenopathy:     Cervical: No cervical adenopathy.   Skin:    General: Skin is warm and dry.     Findings: No erythema or rash.   Neurological:     General: No focal deficit present.     Mental Status: She is alert and oriented to person, place, and time.     Cranial Nerves: No cranial nerve deficit.     Motor: No abnormal muscle tone.     Coordination: Coordination normal.  Deep Tendon Reflexes: Reflexes are normal and symmetric. Reflexes normal.   Psychiatric:        Mood and Affect: Mood normal.        Behavior: Behavior normal.        Thought Content: Thought content normal.        Judgment: Judgment normal.           Assessment & Plan:  Her HTN and GERD are stable. Her anxiety and depression are stable. She will follow up with Dr. Katheryne Pane for the cardiology workup. We will get labs today for A1c, renal function, etc. We will refer her to vestibular rehab for the vertigo. We will set up her first bone density test. We spent a total of (35   ) minutes reviewing records and discussing these issues.  Corita Diego, MD

## 2024-02-09 ENCOUNTER — Ambulatory Visit: Payer: Self-pay | Admitting: Family Medicine

## 2024-02-09 LAB — CBC WITH DIFFERENTIAL/PLATELET
Absolute Lymphocytes: 1742 {cells}/uL (ref 850–3900)
Absolute Monocytes: 594 {cells}/uL (ref 200–950)
Basophils Absolute: 40 {cells}/uL (ref 0–200)
Basophils Relative: 0.6 %
Eosinophils Absolute: 59 {cells}/uL (ref 15–500)
Eosinophils Relative: 0.9 %
HCT: 39.7 % (ref 35.0–45.0)
Hemoglobin: 12.7 g/dL (ref 11.7–15.5)
MCH: 29.5 pg (ref 27.0–33.0)
MCHC: 32 g/dL (ref 32.0–36.0)
MCV: 92.3 fL (ref 80.0–100.0)
MPV: 10 fL (ref 7.5–12.5)
Monocytes Relative: 9 %
Neutro Abs: 4165 {cells}/uL (ref 1500–7800)
Neutrophils Relative %: 63.1 %
Platelets: 243 10*3/uL (ref 140–400)
RBC: 4.3 10*6/uL (ref 3.80–5.10)
RDW: 13.5 % (ref 11.0–15.0)
Total Lymphocyte: 26.4 %
WBC: 6.6 10*3/uL (ref 3.8–10.8)

## 2024-02-09 LAB — BASIC METABOLIC PANEL WITH GFR
BUN: 14 mg/dL (ref 7–25)
CO2: 21 mmol/L (ref 20–32)
Calcium: 9.3 mg/dL (ref 8.6–10.4)
Chloride: 105 mmol/L (ref 98–110)
Creat: 0.94 mg/dL (ref 0.50–1.05)
Glucose, Bld: 79 mg/dL (ref 65–99)
Potassium: 4.1 mmol/L (ref 3.5–5.3)
Sodium: 141 mmol/L (ref 135–146)
eGFR: 67 mL/min/{1.73_m2} (ref >=60)

## 2024-02-09 LAB — HEMOGLOBIN A1C
Hgb A1c MFr Bld: 5.6 % (ref <5.7)
Mean Plasma Glucose: 114 mg/dL
eAG (mmol/L): 6.3 mmol/L

## 2024-02-09 LAB — TSH: TSH: 0.62 m[IU]/L (ref 0.40–4.50)

## 2024-02-12 ENCOUNTER — Telehealth: Payer: Self-pay | Admitting: Family Medicine

## 2024-02-12 MED ORDER — LORAZEPAM 0.5 MG PO TABS: 0.5000 mg | ORAL_TABLET | Freq: Three times a day (TID) | ORAL | 2 refills | Status: DC | PRN

## 2024-02-12 NOTE — Telephone Encounter (Signed)
 I sent in some Lorazepam  for her to try

## 2024-02-12 NOTE — Telephone Encounter (Signed)
 Copied from CRM [id number]. Topic: Clinical - Medication Question >> Feb 12, 2024 10:04 AM Juluis Ok wrote: Reason for CRM: Patient states that during her previous appointment with Dr. Alyne Babinski, they discussed an alternative medication for the Xanax , as it is to strong for patient. Patient states no new medication has been sent to her pharmacy. Patient would like to have alternative medication sent to:   [id number] - [place], [place] - [address] AT [facility] [address] [place] [address] Phone: [phone] Fax: [phone] Hours: Not open 24 hours

## 2024-02-15 NOTE — Telephone Encounter (Signed)
Spoke with pt aware to pick up Rx from her pharmacy 

## 2024-02-29 MED ORDER — ATORVASTATIN CALCIUM 40 MG PO TABS: 40.0000 mg | ORAL_TABLET | Freq: Every day | ORAL | 3 refills | Status: AC

## 2024-03-02 ENCOUNTER — Ambulatory Visit (INDEPENDENT_AMBULATORY_CARE_PROVIDER_SITE_OTHER)
Admission: RE | Admit: 2024-03-02 | Discharge: 2024-03-02 | Disposition: A | Discharge status: Home / Self Care | Source: Ambulatory Visit | Attending: Family Medicine | Admitting: Family Medicine

## 2024-03-02 DIAGNOSIS — M810 Age-related osteoporosis without current pathological fracture: Secondary | ICD-10-CM | POA: Diagnosis not present

## 2024-03-04 ENCOUNTER — Telehealth: Payer: Self-pay | Admitting: Physician Assistant

## 2024-03-04 NOTE — Telephone Encounter (Signed)
 Patient requesting medication refill for mesalamine . Please advise.   Thank you

## 2024-03-04 NOTE — Telephone Encounter (Signed)
 Patient has a refill on hand the script sent on 01-07-24 had 360 with 1 refill. Patient date of birth in [facility] system is [birth year]. Told patient she needs to see about this but they have the refill and that it will be ready Monday since the pharmacy has to order it. She asked if she can get it from a different pharmacy and I told her that she can but she would need to talk to them to see about it and she said she will since she is out

## 2024-03-06 DIAGNOSIS — R002 Palpitations: Secondary | ICD-10-CM

## 2024-03-08 ENCOUNTER — Ambulatory Visit: Admitting: Gastroenterology

## 2024-03-22 ENCOUNTER — Ambulatory Visit (HOSPITAL_COMMUNITY)
Admission: RE | Admit: 2024-03-22 | Discharge: 2024-03-22 | Disposition: A | Discharge status: Home / Self Care | Source: Ambulatory Visit | Attending: Internal Medicine | Admitting: Internal Medicine

## 2024-03-22 DIAGNOSIS — E785 Hyperlipidemia, unspecified: Secondary | ICD-10-CM | POA: Diagnosis present

## 2024-03-22 DIAGNOSIS — Z8679 Personal history of other diseases of the circulatory system: Secondary | ICD-10-CM | POA: Insufficient documentation

## 2024-03-22 DIAGNOSIS — R0789 Other chest pain: Secondary | ICD-10-CM | POA: Diagnosis present

## 2024-03-22 DIAGNOSIS — I700 Atherosclerosis of aorta: Secondary | ICD-10-CM | POA: Insufficient documentation

## 2024-03-22 DIAGNOSIS — Z720 Tobacco use: Secondary | ICD-10-CM | POA: Insufficient documentation

## 2024-03-22 DIAGNOSIS — I10 Essential (primary) hypertension: Secondary | ICD-10-CM | POA: Insufficient documentation

## 2024-03-22 LAB — ECHOCARDIOGRAM COMPLETE
Area-P 1/2: 3.42 cm2
S' Lateral: 2.8 cm

## 2024-03-23 ENCOUNTER — Other Ambulatory Visit: Payer: Self-pay | Admitting: Family Medicine

## 2024-03-28 ENCOUNTER — Ambulatory Visit: Attending: Family Medicine | Admitting: Physical Therapy

## 2024-03-28 ENCOUNTER — Encounter: Payer: Self-pay | Admitting: Physical Therapy

## 2024-03-28 ENCOUNTER — Other Ambulatory Visit: Payer: Self-pay

## 2024-03-28 DIAGNOSIS — R42 Dizziness and giddiness: Secondary | ICD-10-CM | POA: Diagnosis present

## 2024-03-28 DIAGNOSIS — R2681 Unsteadiness on feet: Secondary | ICD-10-CM | POA: Diagnosis present

## 2024-03-28 NOTE — Therapy (Signed)
 OUTPATIENT PHYSICAL THERAPY VESTIBULAR EVALUATION     Patient Name: [name] MRN: [id number] DOB:Jun 04, 1956, 68 y.o., female Today's Date: 03/28/2024  END OF SESSION:  PT End of Session - 03/28/24 1418     Visit Number 1    Number of Visits 12    Date for PT Re-Evaluation 05/06/24    Authorization Type [employer]    Progress Note Due on Visit 10    PT Start Time 1243    PT Stop Time 1320    PT Time Calculation (min) 37 min    Activity Tolerance Patient tolerated treatment well    Behavior During Therapy WFL for tasks assessed/performed          Past Medical History:  Diagnosis Date   Anxiety    Clostridioides difficile infection    Depression    High blood cholesterol    Migraine    Palpitations    Pancreatitis    Ulcerative colitis (HCC)    Past Surgical History:  Procedure Laterality Date   APPENDECTOMY Right    BIOPSY  08/18/2019   Procedure: BIOPSY;  Surgeon: Charlanne Groom, MD;  Location: [facility];  Service: Endoscopy;;   COLONOSCOPY N/A 08/18/2019   Procedure: COLONOSCOPY;  Surgeon: Charlanne Groom, MD;  Location: [facility];  Service: Endoscopy;  Laterality: N/A;   COLONOSCOPY  12/12/2021   per Dr. Charlanne, no polyps, repeat in 5 yrs   ESOPHAGOGASTRODUODENOSCOPY N/A 08/18/2019   per Dr. Charlanne, showed GERD, neg for H pylori   POLYPECTOMY  08/18/2019   Procedure: POLYPECTOMY;  Surgeon: Charlanne Groom, MD;  Location: [facility];  Service: Endoscopy;;   TONSILLECTOMY     Patient Active Problem List   Diagnosis Date Noted   Vertigo 02/08/2024   Atypical chest pain 02/03/2024   Memory loss 12/02/2023   Dyslipidemia 11/09/2023   Aortic atherosclerosis (HCC) 11/09/2023   Primary hypertension 11/09/2023   GERD (gastroesophageal reflux disease) 12/22/2022   Pancolitis (HCC) 09/08/2019   Elevated lipase    Rectal bleeding    Acute pancreatitis 08/11/2019   Hypokalemia 08/11/2019   Anxiety with depression 08/11/2019   Skin cancer 10/13/2018    Tobacco abuse 04/14/2015   Insomnia 10/10/2011   Colitis 02/17/2011   PALPITATIONS, HX OF 10/29/2009    PCP: Johnny Garnette LABOR, MD  REFERRING PROVIDER: Johnny Garnette LABOR, MD   REFERRING DIAG: R42 (ICD-10-CM) - Vertigo   THERAPY DIAG:  Dizziness and giddiness  Unsteadiness on feet  ONSET DATE: 02/08/2024 MD referral  Rationale for Evaluation and Treatment: Rehabilitation  SUBJECTIVE:   SUBJECTIVE STATEMENT: Dizziness has been going on for about a year.  Every night when I lie down and when I get up, there is spinning that stops quickly.   Also, imbalanced along the hallway when walking to get to bed.  Pt accompanied by: self  PERTINENT HISTORY: memory difficulties, HTN, GERD, anxiety, and depression, TMJ, hx of R RTC repair (following fall 2 years ago)  PAIN:  Are you having pain? 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 Yes. Number of falls 1-2  LIVING ENVIRONMENT: Lives with: lives with their spouse Lives in: House/apartment Stairs: No Has following equipment at home: None  PLOF: Independent and Leisure: retired early this year; enjoys gardening, travelling  PATIENT GOALS: To get rid of the dizziness  OBJECTIVE:  Note: Objective measures were completed at Evaluation unless  otherwise noted.  DIAGNOSTIC FINDINGS: MRI:  IMPRESSION: 1. No acute intracranial finding. 2. Several small chronic insults within the cerebral white matter, nonspecific but most often secondary to chronic small vessel ischemia. 3. Mild generalized parenchymal atrophy.  COGNITION: Overall cognitive status: Within functional limits for tasks assessed and undergoing testing in regards to memory   POSTURE:  rounded shoulders and forward head  Cervical ROM:    Active A/PROM (deg) eval  Flexion 30  Extension 30  Right lateral flexion   Left lateral flexion WFL  Right rotation WFL   Left rotation   (Blank rows = not tested)   TRANSFERS: Assistive device utilized: None  Sit to stand: Modified independence Stand to sit: Modified independence  GAIT: Gait pattern: step through pattern Distance walked: 50 ft  Assistive device utilized: None Level of assistance: Modified independence Comments: No overt LOB; further gait/balance testing needed  PATIENT SURVEYS:  DHI: THE DIZZINESS HANDICAP INVENTORY (DHI)  P1. Does looking up increase your problem? 4 = Yes  E2. Because of your problem, do you feel frustrated? 4 = Yes  F3. Because of your problem, do you restrict your travel for business or recreation?  2 = Sometimes  P4. Does walking down the aisle of a supermarket increase your problems?  0 = No  F5. Because of your problem, do you have difficulty getting into or out of bed?  4 = Yes  F6. Does your problem significantly restrict your participation in social activities, such as going out to dinner, going to the movies, dancing, or going to parties? 2 = Sometimes  F7. Because of your problem, do you have difficulty reading?  2 = Sometimes  P8. Does performing more ambitious activities such as sports, dancing, household chores (sweeping or putting dishes away) increase your problems?  4 = Yes  E9. Because of your problem, are you afraid to leave your home without having without having someone accompany you?  0 = No  E10. Because of your problem have you been embarrassed in front of others?  2 = Sometimes  P11. Do quick movements of your head increase your problem?  4 = Yes  F12. Because of your problem, do you avoid heights?  0 = No  P13. Does turning over in bed increase your problem?  4 = Yes  F14. Because of your problem, is it difficult for you to do strenuous homework or yard work? 2 = Sometimes  E15. Because of your problem, are you afraid people may think you are intoxicated? 2 = Sometimes  F16. Because of your problem, is it difficult for you to go for a walk  by yourself?  0 = No  P17. Does walking down a sidewalk increase your problem?  0 = No  E18.Because of your problem, is it difficult for you to concentrate 2 = Sometimes  F19. Because of your problem, is it difficult for you to walk around your house in the dark? 2 = Sometimes  E20. Because of your problem, are you afraid to stay home alone?  2 = Sometimes  E21. Because of your problem, do you feel handicapped? NA  E22. Has the problem placed stress on your relationships with members of your family or friends? 2 = Sometimes  E23. Because of your problem, are you depressed?  2 = Sometimes  F24. Does your problem interfere with your job or household responsibilities?  2 = Sometimes  P25. Does bending over increase your problem?  4 = Yes  TOTAL 52    DHI Scoring Instructions  The patient is asked to answer each question as it pertains to dizziness or unsteadiness problems, specifically  considering their condition during the last month. Questions are designed to incorporate functional (F), physical  (P), and emotional (E) impacts on disability.   Scores greater than 10 points should be referred to balance specialists for further evaluation.   16-34 Points (mild handicap)  36-52 Points (moderate handicap)  54+ Points (severe handicap)  Minimally Detectable Change: 17 points ([address] [place], 1990)  Bellwood, G. SHAUNNA. and Garrison, C. W. (1990). The development of the Dizziness Handicap Inventory. Archives of Otolaryngology - Head and Neck Surgery 116(4): [id number].   VESTIBULAR ASSESSMENT:  GENERAL OBSERVATION: No acute distress   SYMPTOM BEHAVIOR:  Subjective history: Had a fall year before last and had surgery late 2023 for RTC repair.  Dizziness started about then.  Was given some type of antibiotic earlier in the year for GI issues, but then taken off.  Denies head injury, headaches, changes in hearing.  Reports she is to have cataract surgery in the next several months.  Does have hx  of dry eyes and has tubes in tear ducts.  Describes spinning sensation with getting in and out of bed  Non-Vestibular symptoms: neck pain and nausea/vomiting  Type of dizziness: Imbalance (Disequilibrium), Spinning/Vertigo, and Unsteady with head/body turns  Frequency: daily  Duration: seconds  Aggravating factors: Induced by position change: lying supine and lying to right side and Induced by motion: looking up at the ceiling, turning body quickly, and turning head quickly  Relieving factors: head stationary and closing eyes  Progression of symptoms: worse  OCULOMOTOR EXAM:  Ocular Alignment: normal  Ocular ROM: No Limitations  Spontaneous Nystagmus: absent  Gaze-Induced Nystagmus: mild nystagmus at end range and coming from L to R  Smooth Pursuits: intact  Saccades: hypermetric/overshoots-  Convergence/Divergence: doesn't become double-L eye decreased convergence   VESTIBULAR - OCULAR REFLEX:   Slow VOR: Normal  VOR Cancellation: Comment: rates mild dizziness 2-3/10  Head-Impulse Test: HIT Right: positive HIT Left: positive  Reports feeling dizzy after completion  Dynamic Visual Acuity: NT   POSITIONAL TESTING: Right Dix-Hallpike: no nystagmus Left Dix-Hallpike: no nystagmus and felt like whoosh Right Roll Test: no nystagmus Left Roll Test: no nystagmus R Loaded DH:  Negative L loaded DH:  Negative  MOTION SENSITIVITY: NOT TESTED AT EVAL  Motion Sensitivity Quotient Intensity: 0 = none, 1 = Lightheaded, 2 = Mild, 3 = Moderate, 4 = Severe, 5 = Vomiting  Intensity  1. Sitting to supine   2. Supine to L side   3. Supine to R side   4. Supine to sitting   5. L Hallpike-Dix   6. Up from L    7. R Hallpike-Dix   8. Up from R    9. Sitting, head tipped to L knee   10. Head up from L knee   11. Sitting, head tipped to R knee   12. Head up from R knee   13. Sitting head turns x5   14.Sitting head nods x5   15. In stance, 180 turn to L    16. In stance, 180 turn to R        M-CTSIB  Condition 1: Firm Surface, EO 30 Sec, Mild Sway  Condition 2: Firm Surface, EC 25 Sec, Moderate and Severe Sway  Condition 3: Foam Surface, EO 30 Sec, Mild Sway  Condition 4: Foam Surface, EC 25  Sec, Moderate and Severe Sway                                                                                                                              TREATMENT DATE: 03/28/2024   PATIENT EDUCATION: Education details: Eval results, POC Person educated: Patient Education method: Explanation Education comprehension: verbalized understanding  HOME EXERCISE PROGRAM:  GOALS: Goals reviewed with patient? Yes  SHORT TERM GOALS: Target date: 04/22/2024  Pt will be independent with HEP for improved dizziness and balance. Baseline: Goal status: INITIAL  2.  Pt will report 0/10 dizziness with bed mobility. Baseline:  Goal status: INITIAL  LONG TERM GOALS: Target date: 05/06/2024  Pt will be independent with HEP for improved balance, dizziness. Baseline:  Goal status: INITIAL  2.  DHI score to improve to less than or equal to 34, to demo improved dizziness with functional activities. Baseline: 52 Goal status: INITIAL  3.  MCTSIB score to improve to 30 seconds on Condition 2 and 4, for improved vestibular system use for balance. Baseline:  Goal status: INITIAL  4.  Pt to score at least 20/24 on DGI for improved balance and gait.  Baseline: TBA Goal status: INITIAL   ASSESSMENT:  CLINICAL IMPRESSION: Patient is a 68 y.o. female who was seen today for physical therapy evaluation and treatment for vertigo.  She reports at least 1 year of vertigo-spinning sensation that last 30 sec or less with getting into and out of bed as well as imbalance sensation with walking at night.  She denies hx of headaches or head trauma; she has had GI symptoms requiring antibiotic this year and she has cataracts (upcoming surgery in the next few months).  She has dizziness with some of the  oculomotor testing and has bilateral HIT test.  Positional testing today is negative for nystagmus with posterior or horizontal canals.  She has brief dizziness with L DH, but describes it more as a whoosh and no nystagmus noted.  She does demo decreased vestibular system use with MCTSIB testing and reports moderate disability with DHI score of 52.  She will benefit from skilled PT to address the above stated deficits to decrease dizziness, improved balance and decrease fall risk for overall improved functional mobility.   OBJECTIVE IMPAIRMENTS: decreased balance and dizziness.   ACTIVITY LIMITATIONS: bending, squatting, bed mobility, reach over head, and locomotion level  PARTICIPATION LIMITATIONS: meal prep, cleaning, laundry, driving, shopping, community activity, and yard work  PERSONAL FACTORS: 3+ comorbidities: see above for PMH are also affecting patient's functional outcome.   REHAB POTENTIAL: Good  CLINICAL DECISION MAKING: Stable/uncomplicated  EVALUATION COMPLEXITY: Low   PLAN:  PT FREQUENCY: 1-2x/week  PT DURATION: 6 weeks  PLANNED INTERVENTIONS: 97750- Physical Performance Testing, 97110-Therapeutic exercises, 97530- Therapeutic activity, [id number]- Neuromuscular re-education, 97535- Self Care, [id number]- Manual therapy, [phone]- Gait training, [phone]- Canalith repositioning, Patient/Family education, Balance training, and Vestibular training  PLAN FOR NEXT SESSION: Reassess BPPV for horizontal/posterior  canals; try sidelying test and consider habituation with Brandt-Daroff.  Initiate multi-sensory balance exercises for HEP.  *If positive for BPPV, will need to update/recert with this diagnosis    Dorri Ozturk W., PT 03/28/2024, 2:20 PM

## 2024-03-30 ENCOUNTER — Ambulatory Visit

## 2024-03-30 DIAGNOSIS — R2681 Unsteadiness on feet: Secondary | ICD-10-CM

## 2024-03-30 DIAGNOSIS — R42 Dizziness and giddiness: Secondary | ICD-10-CM | POA: Diagnosis not present

## 2024-03-30 NOTE — Therapy (Signed)
 OUTPATIENT PHYSICAL THERAPY VESTIBULAR TREATMENT     Patient Name: [name] MRN: [id number] DOB:02-04-1956, 68 y.o., female Today's Date: 03/30/2024  END OF SESSION:  PT End of Session - 03/30/24 1018     Visit Number 2    Number of Visits 12    Date for PT Re-Evaluation 05/06/24    Authorization Type [employer]    Progress Note Due on Visit 10    PT Start Time 1015    PT Stop Time 1100    PT Time Calculation (min) 45 min    Activity Tolerance Patient tolerated treatment well    Behavior During Therapy WFL for tasks assessed/performed          Past Medical History:  Diagnosis Date   Anxiety    Clostridioides difficile infection    Depression    High blood cholesterol    Migraine    Palpitations    Pancreatitis    Ulcerative colitis (HCC)    Past Surgical History:  Procedure Laterality Date   APPENDECTOMY Right    BIOPSY  08/18/2019   Procedure: BIOPSY;  Surgeon: Charlanne Groom, MD;  Location: [facility];  Service: Endoscopy;;   COLONOSCOPY N/A 08/18/2019   Procedure: COLONOSCOPY;  Surgeon: Charlanne Groom, MD;  Location: [facility];  Service: Endoscopy;  Laterality: N/A;   COLONOSCOPY  12/12/2021   per Dr. Charlanne, no polyps, repeat in 5 yrs   ESOPHAGOGASTRODUODENOSCOPY N/A 08/18/2019   per Dr. Charlanne, showed GERD, neg for H pylori   POLYPECTOMY  08/18/2019   Procedure: POLYPECTOMY;  Surgeon: Charlanne Groom, MD;  Location: [facility];  Service: Endoscopy;;   TONSILLECTOMY     Patient Active Problem List   Diagnosis Date Noted   Vertigo 02/08/2024   Atypical chest pain 02/03/2024   Memory loss 12/02/2023   Dyslipidemia 11/09/2023   Aortic atherosclerosis (HCC) 11/09/2023   Primary hypertension 11/09/2023   GERD (gastroesophageal reflux disease) 12/22/2022   Pancolitis (HCC) 09/08/2019   Elevated lipase    Rectal bleeding    Acute pancreatitis 08/11/2019   Hypokalemia 08/11/2019   Anxiety with depression 08/11/2019   Skin cancer 10/13/2018    Tobacco abuse 04/14/2015   Insomnia 10/10/2011   Colitis 02/17/2011   PALPITATIONS, HX OF 10/29/2009    PCP: Johnny Garnette LABOR, MD  REFERRING PROVIDER: Johnny Garnette LABOR, MD   REFERRING DIAG: R42 (ICD-10-CM) - Vertigo   THERAPY DIAG:  Dizziness and giddiness  Unsteadiness on feet  ONSET DATE: 02/08/2024 MD referral  Rationale for Evaluation and Treatment: Rehabilitation  SUBJECTIVE:   SUBJECTIVE STATEMENT: Positional dizziness is fleeting and balance issues when walking in the dark.  Did not have the positional dizziness as bad this morning either and that aspect seems to have been improved even a few days before starting here.   Pt accompanied by: self  PERTINENT HISTORY: memory difficulties, HTN, GERD, anxiety, and depression, TMJ, hx of R RTC repair (following fall 2 years ago)  PAIN:  Are you having pain? 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 Yes. Number of falls 1-2  LIVING ENVIRONMENT: Lives with: lives with their spouse Lives in: House/apartment Stairs: No Has following equipment at home: None  PLOF: Independent and Leisure: retired early this year; enjoys gardening, travelling  PATIENT GOALS: To get rid of the dizziness  OBJECTIVE:   TODAY'S TREATMENT: 03/30/24 Activity Comments  DGI  22/24  Left/Right Sidelying No nystagmus, no dizziness-slight symptoms to head movement in right sidelying  Seated VOR x 1 Increased speed to 120 bpm--notes no issues  Head Impulse Test No corrective saccade noted  M-CTSIB Condition 1: normal Condition 2: mild Condition 3: mild Condition 4: moderate-severe  Corner balance Feet together EO/EC x 30 sec Feet together head movements EO/EC x 30 sec      [facility] PT Assessment - 03/30/24 0001       Standardized Balance Assessment   Standardized Balance Assessment Dynamic Gait Index      Dynamic Gait Index   Level Surface  Normal    Change in Gait Speed Normal    Gait with Horizontal Head Turns Moderate Impairment    Gait with Vertical Head Turns Normal    Gait and Pivot Turn Normal    Step Over Obstacle Normal    Step Around Obstacles Normal    Steps Normal    Total Score 22           PATIENT EDUCATION: Education details: Eval results, POC Person educated: Patient Education method: Explanation Education comprehension: verbalized understanding  HOME EXERCISE PROGRAM: Access Code: [email or web address] URL: [email or web address] Date: 03/30/2024 Prepared by: Burnard Sandifer  Exercises - Brandt-Daroff Vestibular Exercise  - 1 x daily - 7 x weekly - 5 reps - Corner Balance Feet Together With Eyes Open  - 1 x daily - 7 x weekly - 3 sets - 30 sec hold - Corner Balance Feet Together With Eyes Closed  - 1 x daily - 7 x weekly - 3 sets - 30 sec hold - Corner Balance Feet Together: Eyes Open With Head Turns  - 1 x daily - 7 x weekly - 3 sets - 30 sec hold - Corner Balance Feet Together: Eyes Closed With Head Turns  - 1 x daily - 7 x weekly - 3 sets - 30 sec hold Note: Objective measures were completed at Evaluation unless otherwise noted.  DIAGNOSTIC FINDINGS: MRI:  IMPRESSION: 1. No acute intracranial finding. 2. Several small chronic insults within the cerebral white matter, nonspecific but most often secondary to chronic small vessel ischemia. 3. Mild generalized parenchymal atrophy.  COGNITION: Overall cognitive status: Within functional limits for tasks assessed and undergoing testing in regards to memory   POSTURE:  rounded shoulders and forward head  Cervical ROM:    Active A/PROM (deg) eval  Flexion 30  Extension 30  Right lateral flexion   Left lateral flexion WFL  Right rotation WFL  Left rotation   (Blank rows = not tested)   TRANSFERS: Assistive device utilized: None  Sit to stand: Modified independence Stand to sit: Modified independence  GAIT: Gait pattern: step  through pattern Distance walked: 50 ft  Assistive device utilized: None Level of assistance: Modified independence Comments: No overt LOB; further gait/balance testing needed  PATIENT SURVEYS:  DHI: THE DIZZINESS HANDICAP INVENTORY (DHI)  P1. Does looking up increase your problem? 4 = Yes  E2. Because of your problem, do you feel frustrated? 4 = Yes  F3. Because of your problem, do you restrict your travel for business or recreation?  2 = Sometimes  P4. Does walking down the aisle of a supermarket increase your problems?  0 = No  F5. Because of your problem, do you have difficulty getting into or out of bed?  4 = Yes  F6. Does your problem significantly restrict your participation in social activities, such as going out to dinner, going to the  movies, dancing, or going to parties? 2 = Sometimes  F7. Because of your problem, do you have difficulty reading?  2 = Sometimes  P8. Does performing more ambitious activities such as sports, dancing, household chores (sweeping or putting dishes away) increase your problems?  4 = Yes  E9. Because of your problem, are you afraid to leave your home without having without having someone accompany you?  0 = No  E10. Because of your problem have you been embarrassed in front of others?  2 = Sometimes  P11. Do quick movements of your head increase your problem?  4 = Yes  F12. Because of your problem, do you avoid heights?  0 = No  P13. Does turning over in bed increase your problem?  4 = Yes  F14. Because of your problem, is it difficult for you to do strenuous homework or yard work? 2 = Sometimes  E15. Because of your problem, are you afraid people may think you are intoxicated? 2 = Sometimes  F16. Because of your problem, is it difficult for you to go for a walk by yourself?  0 = No  P17. Does walking down a sidewalk increase your problem?  0 = No  E18.Because of your problem, is it difficult for you to concentrate 2 = Sometimes  F19. Because of your  problem, is it difficult for you to walk around your house in the dark? 2 = Sometimes  E20. Because of your problem, are you afraid to stay home alone?  2 = Sometimes  E21. Because of your problem, do you feel handicapped? NA  E22. Has the problem placed stress on your relationships with members of your family or friends? 2 = Sometimes  E23. Because of your problem, are you depressed?  2 = Sometimes  F24. Does your problem interfere with your job or household responsibilities?  2 = Sometimes  P25. Does bending over increase your problem?  4 = Yes  TOTAL 52    DHI Scoring Instructions  The patient is asked to answer each question as it pertains to dizziness or unsteadiness problems, specifically  considering their condition during the last month. Questions are designed to incorporate functional (F), physical  (P), and emotional (E) impacts on disability.   Scores greater than 10 points should be referred to balance specialists for further evaluation.   16-34 Points (mild handicap)  36-52 Points (moderate handicap)  54+ Points (severe handicap)  Minimally Detectable Change: 17 points ([address] [place], 1990)  Ramos, G. SHAUNNA. and Ali Chukson, C. W. (1990). The development of the Dizziness Handicap Inventory. Archives of Otolaryngology - Head and Neck Surgery 116(4): [id number].   VESTIBULAR ASSESSMENT:  GENERAL OBSERVATION: No acute distress   SYMPTOM BEHAVIOR:  Subjective history: Had a fall year before last and had surgery late 2023 for RTC repair.  Dizziness started about then.  Was given some type of antibiotic earlier in the year for GI issues, but then taken off.  Denies head injury, headaches, changes in hearing.  Reports she is to have cataract surgery in the next several months.  Does have hx of dry eyes and has tubes in tear ducts.  Describes spinning sensation with getting in and out of bed  Non-Vestibular symptoms: neck pain and nausea/vomiting  Type of dizziness: Imbalance  (Disequilibrium), Spinning/Vertigo, and Unsteady with head/body turns  Frequency: daily  Duration: seconds  Aggravating factors: Induced by position change: lying supine and lying to right side and Induced by motion: looking up  at the ceiling, turning body quickly, and turning head quickly  Relieving factors: head stationary and closing eyes  Progression of symptoms: worse  OCULOMOTOR EXAM:  Ocular Alignment: normal  Ocular ROM: No Limitations  Spontaneous Nystagmus: absent  Gaze-Induced Nystagmus: mild nystagmus at end range and coming from L to R  Smooth Pursuits: intact  Saccades: hypermetric/overshoots-  Convergence/Divergence: doesn't become double-L eye decreased convergence   VESTIBULAR - OCULAR REFLEX:   Slow VOR: Normal  VOR Cancellation: Comment: rates mild dizziness 2-3/10  Head-Impulse Test: HIT Right: positive HIT Left: positive  Reports feeling dizzy after completion  Dynamic Visual Acuity: NT   POSITIONAL TESTING: Right Dix-Hallpike: no nystagmus Left Dix-Hallpike: no nystagmus and felt like whoosh Right Roll Test: no nystagmus Left Roll Test: no nystagmus R Loaded DH:  Negative L loaded DH:  Negative  MOTION SENSITIVITY: NOT TESTED AT EVAL  Motion Sensitivity Quotient Intensity: 0 = none, 1 = Lightheaded, 2 = Mild, 3 = Moderate, 4 = Severe, 5 = Vomiting  Intensity  1. Sitting to supine   2. Supine to L side   3. Supine to R side   4. Supine to sitting   5. L Hallpike-Dix   6. Up from L    7. R Hallpike-Dix   8. Up from R    9. Sitting, head tipped to L knee   10. Head up from L knee   11. Sitting, head tipped to R knee   12. Head up from R knee   13. Sitting head turns x5   14.Sitting head nods x5   15. In stance, 180 turn to L    16. In stance, 180 turn to R       M-CTSIB  Condition 1: Firm Surface, EO 30 Sec, Mild Sway  Condition 2: Firm Surface, EC 25 Sec, Moderate and Severe Sway  Condition 3: Foam Surface, EO 30 Sec, Mild Sway   Condition 4: Foam Surface, EC 25 Sec, Moderate and Severe Sway                                                                                                                              TREATMENT DATE: 03/28/2024    GOALS: Goals reviewed with patient? Yes  SHORT TERM GOALS: Target date: 04/22/2024  Pt will be independent with HEP for improved dizziness and balance. Baseline: Goal status: INITIAL  2.  Pt will report 0/10 dizziness with bed mobility. Baseline:  Goal status: INITIAL  LONG TERM GOALS: Target date: 05/06/2024  Pt will be independent with HEP for improved balance, dizziness. Baseline:  Goal status: INITIAL  2.  DHI score to improve to less than or equal to 34, to demo improved dizziness with functional activities. Baseline: 52 Goal status: INITIAL  3.  MCTSIB score to improve to 30 seconds on Condition 2 and 4, for improved vestibular system use for balance. Baseline:  Goal status: INITIAL  4.  Pt to score  at least 20/24 on DGI for improved balance and gait.  Baseline: 22/24 Goal status: MET   ASSESSMENT:  CLINICAL IMPRESSION: Reports her positional dizziness has overall improved and had been improving even before initiating care here at our clinic.  Performed Dynamic Gait Index where she did well with score 22/24 indicating low risk for falls and the only problematic area was walking with horizontal head turns.  Right/Left sidelying test without provocation of vertigo or nystagmus and experience of slight motion sensitivity to right sidelying when initiating motion. Faster VOR x 1 without significant provocation and feels the blurred vision aspect is more from cataracts than dizziness.  M-CTSIB repeated with similar outcome with increased sway to conditions 2 and 4.  Instructed in corner balance activities for multisensory balance with significant difficulty with eyes closed and head movements with tendency for right LOB.  Provided HEP for reference of  brandt-daroff for self-assessment and habituation and balance exercises to address deficits.   OBJECTIVE IMPAIRMENTS: decreased balance and dizziness.   ACTIVITY LIMITATIONS: bending, squatting, bed mobility, reach over head, and locomotion level  PARTICIPATION LIMITATIONS: meal prep, cleaning, laundry, driving, shopping, community activity, and yard work  PERSONAL FACTORS: 3+ comorbidities: see above for PMH are also affecting patient's functional outcome.   REHAB POTENTIAL: Good  CLINICAL DECISION MAKING: Stable/uncomplicated  EVALUATION COMPLEXITY: Low   PLAN:  PT FREQUENCY: 1-2x/week  PT DURATION: 6 weeks  PLANNED INTERVENTIONS: 97750- Physical Performance Testing, 97110-Therapeutic exercises, 97530- Therapeutic activity, [id number]- Neuromuscular re-education, 97535- Self Care, [id number]- Manual therapy, [id number]- Gait training, [phone]- Canalith repositioning, Patient/Family education, Balance training, and Vestibular training  PLAN FOR NEXT SESSION: Reassess BPPV for horizontal/posterior canals; try sidelying test and consider habituation with Brandt-Daroff.  Initiate multi-sensory balance exercises for HEP.  *If positive for BPPV, will need to update/recert with this diagnosis   12:44 PM, 03/30/24 M. Kelly Kelani Robart, PT, DPT Physical Therapist- [place] Office Number: [phone]

## 2024-03-30 NOTE — Progress Notes (Unsigned)
 03/31/2024 [name] [id number] 1956-07-30  Referring provider: Johnny Garnette LABOR, MD Primary GI doctor: Dr. Charlanne  ASSESSMENT AND PLAN:   Ulcerative Colitis UC flare 12/2023, increased mesalamine  to 2.4 mg and had 1 month prednisone  taper, negative diatherix Resolved with prednisone  taper, but 2-3 weeks after taper symptoms returned but not as bad with 1 BM, no blood in stool, continuing pelvic pressure, nausea and gas Considering post-flare sensitivity versus ongoing inflammation. - Recheck fecal calprotectin and sed rate. - Prescribe budesonide  (uceris )  for 2 months. - Provide FODMAP diet guidance. - Order abdominal x-ray, consider CT pending labs - Consider restaging colonoscopy if fecal calprotectin remains elevated. - Discussed potential biologic therapy options based on insurance coverage and preference for infusions including entyvio or remicaide if patient is not responsive to uceris  -She is up to date on vaccinations, TB screening and Hep B screening - follow up Dr. Charlanne 2-3 months  Rectal Fissure Posterior rectal fissure likely related to ulcerative colitis or straining Used diltiazem and calmol 4 with improvement On examination has resolved Negative FOBT  Diverticulosis Diverticulosis noted on CT scan in Jan. No current symptoms or evidence of diverticulitis.  Pelvic Congestion Syndrome Enlarged pelvic blood vessels noted on CT scan. No current intervention planned unless symptoms worsen.  Patient Care Team: Johnny Garnette LABOR, MD as PCP - General (Family Medicine) Wertman, Sara E, PA-C (Neurology) Court Dorn PARAS, MD as Consulting Physician (Cardiology)  HISTORY OF PRESENT ILLNESS: 68 y.o. female presents for evaluation of UC pancolitis. Last seen in the office on 12/29/2023 by myself for ulcerative colitis flare versus infection.  IBD history: Diagnosed 07/2019 after perfuse rectal bleeding Has been mesalamine  since that time. Last exacerbation 07/2021  with prednisone  maintained on mesalamine  12/29/2023 patient had office visit for diarrhea and rectal bleeding nausea gas had fecal calprotectin 3000 negative Diatherix, mesalamine  increased to 2.4 mg twice daily and had prednisone  taper of a month  Last colonoscopy: 11/2021 normal suggestive of complete mucosal healing mild sigmoid diverticulosis recall 3 years 07/2019 colonoscopy moderately active pancolitis normal TI  Last small bowel imaging:   09/11/2023 CT and pelvis with contrast no acute abnormality scattered left-sided diverticulosis no diverticulitis moderate formed stool in the colon prominence of early filling of bilateral gonadal veins and pelvic lateral vessels possible pelvic congestion syndrome Extraintestinal manifestations: erythema nodosum (tender flat nodules) possibly on legs with flat tender/itchy nodules, has scalp itching, no rash Surgical history: no surgery.  Other significant medical history: GERD on pantoprazole  40 mg daily Mucus membranes dry, check sjogren's Quit smoking in March 2025  Current History Discussed the use of AI scribe software for clinical note transcription with the patient, who gave verbal consent to proceed.  History of Present Illness   [name] is a 68 year old female with ulcerative colitis who presents with gastrointestinal symptoms. She is accompanied by her husband.  She has a history of ulcerative colitis with significantly elevated fecal calprotectin levels at 6000 during her last visit in May. She was treated with a prednisone  taper for one month, which initially improved her symptoms. However, a few weeks after completing the taper, her symptoms gradually returned.  Currently, she experiences loose stools described as 'an explosion every morning,' but denies diarrhea and bleeding. She has one bowel movement per day. Persistent gas and rectal pressure improve after bowel movements. Nausea has improved but is still present. No fever,  chills, weight loss, or trouble swallowing. She has experienced some heartburn.  She is currently taking mesalamine  at a dose of 2.4 mg, which was increased after her last visit. She quit smoking in March and has gained weight due to increased eating.  No history of skin cancer and has not had any recent vaccinations. She is on vitamin D  supplementation.      Recent labs: 12/29/2023 TB GOLD NEGATIVE 12/29/2023  HepBsAG NON-REACTIVE   09/04/2023 CRP <1.0  02/08/2024 WBC 6.6 HGB 12.7 MCV 92.3  01/01/2024 Iron 70 Ferritin 139.0 B12 558 Fecal cal 6040 on 05/07   IBD Health Care Maintenance: Annual Flu Vaccine - yearly  Pneumococcal Vaccine if receiving immunosuppression: -  UTD TB testing if on anti-TNF, yearly - done Vitamin D  screening - on vitamin D  COVID vaccine - 2021 Shingrix  - 2021  Immunization History  Administered Date(s) Administered   Influenza,inj,Quad PF,6+ Mos 10/13/2018   Influenza-Unspecified 06/08/2019   PFIZER(Purple Top)SARS-COV-2 Vaccination 10/24/2019, 03/25/2020   PNEUMOCOCCAL CONJUGATE-20 02/06/2023   Tdap 09/01/2018   Zoster Recombinant(Shingrix ) 10/13/2018, 01/27/2020    RELEVANT GI HISTORY, LABS, IMAGING:  CBC    Component Value Date/Time   WBC 6.6 02/08/2024 1131   RBC 4.30 02/08/2024 1131   HGB 12.7 02/08/2024 1131   HGB 12.8 11/18/2021 1317   HCT 39.7 02/08/2024 1131   HCT 37.3 11/18/2021 1317   PLT 243 02/08/2024 1131   PLT 264 11/18/2021 1317   MCV 92.3 02/08/2024 1131   MCV 89 11/18/2021 1317   MCH 29.5 02/08/2024 1131   MCHC 32.0 02/08/2024 1131   RDW 13.5 02/08/2024 1131   RDW 12.0 11/18/2021 1317   LYMPHSABS 1.1 12/29/2023 1455   LYMPHSABS 2.0 11/18/2021 1317   MONOABS 0.5 12/29/2023 1455   EOSABS 59 02/08/2024 1131   EOSABS 0.0 11/18/2021 1317   BASOSABS 40 02/08/2024 1131   BASOSABS 0.1 11/18/2021 1317   Recent Labs    09/04/23 1453 10/27/23 1209 12/29/23 1455 02/08/24 1131  HGB 13.2 13.1 12.1 12.7    CMP     Component  Value Date/Time   NA 141 02/08/2024 1131   NA 145 (H) 11/18/2021 1317   K 4.1 02/08/2024 1131   CL 105 02/08/2024 1131   CO2 21 02/08/2024 1131   GLUCOSE 79 02/08/2024 1131   BUN 14 02/08/2024 1131   BUN 13 11/18/2021 1317   CREATININE 0.94 02/08/2024 1131   CALCIUM  9.3 02/08/2024 1131   PROT 6.7 02/03/2024 1144   ALBUMIN 4.5 02/03/2024 1144   AST 18 02/03/2024 1144   ALT 20 02/03/2024 1144   ALKPHOS 83 02/03/2024 1144   BILITOT 0.7 02/03/2024 1144   GFRNONAA >60 10/27/2023 1209   GFRAA >60 08/17/2019 0556      Latest Ref Rng & Units 02/03/2024   11:44 AM 12/29/2023    2:55 PM 09/04/2023    2:53 PM  Hepatic Function  Total Protein 6.0 - 8.5 g/dL 6.7  6.8  7.1   Albumin 3.9 - 4.9 g/dL 4.5  4.4  4.5   AST 0 - 40 IU/L 18  15  12    ALT 0 - 32 IU/L 20  16  10    Alk Phosphatase 44 - 121 IU/L 83  73  67   Total Bilirubin 0.0 - 1.2 mg/dL 0.7  0.8  0.7   Bilirubin, Direct 0.00 - 0.40 mg/dL 9.75  0.1        Current Medications:   Current Outpatient Medications (Endocrine & Metabolic):    Budesonide  ER (UCERIS ) 9 MG TB24, Take 1 tablet (  9 mg total) by mouth daily.  Current Outpatient Medications (Cardiovascular):    atorvastatin  (LIPITOR) 40 MG tablet, Take 1 tablet (40 mg total) by mouth daily.   losartan  (COZAAR ) 50 MG tablet, Take 1 tablet (50 mg total) by mouth daily.     Current Outpatient Medications (Other):    buPROPion  (WELLBUTRIN  XL) 300 MG 24 hr tablet, TAKE 1 TABLET(300 MG) BY MOUTH IN THE MORNING   dicyclomine  (BENTYL ) 20 MG tablet, Take 1 tablet (20 mg total) by mouth 3 (three) times daily as needed for spasms.   LORazepam  (ATIVAN ) 0.5 MG tablet, Take 1 tablet (0.5 mg total) by mouth every 8 (eight) hours as needed for anxiety.   mesalamine  (LIALDA ) 1.2 g EC tablet, Take 2 tablets (2.4 g total) by mouth 2 (two) times daily.   Multiple Vitamin (MULTIVITAMIN) tablet, Take 1 tablet by mouth daily.   ondansetron  (ZOFRAN -ODT) 4 MG disintegrating tablet, Take 1 tablet  (4 mg total) by mouth every 8 (eight) hours as needed for nausea or vomiting.   pantoprazole  (PROTONIX ) 40 MG tablet, Take 1 tablet (40 mg total) by mouth daily.   sertraline  (ZOLOFT ) 100 MG tablet, TAKE 1 TABLET(100 MG) BY MOUTH DAILY   AMBULATORY NON FORMULARY MEDICATION, Medication Name: Diltiazem 2%/Lidocaine  2%   Using your index finger apply a small amount of medication inside the anal opening and to the external anal area twice daily x 6 weeks. (Patient not taking: Reported on 03/31/2024)  Medical History:  Past Medical History:  Diagnosis Date   Anxiety    Clostridioides difficile infection    Depression    High blood cholesterol    Migraine    Palpitations    Pancreatitis    Ulcerative colitis (HCC)    Allergies: No Known Allergies   Surgical History:  She  has a past surgical history that includes Tonsillectomy; Appendectomy (Right); Esophagogastroduodenoscopy (N/A, 08/18/2019); Colonoscopy (N/A, 08/18/2019); biopsy (08/18/2019); polypectomy (08/18/2019); and Colonoscopy (12/12/2021). Family History:  Her family history includes Hypertension in her mother; Liver disease in her brother and brother; Prostate cancer in her father.  REVIEW OF SYSTEMS  : All other systems reviewed and negative except where noted in the History of Present Illness.  PHYSICAL EXAM: BP 110/72   Pulse 74   Ht 5' 5 (1.651 m)   Wt 145 lb (65.8 kg)   BMI 24.13 kg/m  Physical Exam   GENERAL APPEARANCE: Well nourished, in no apparent distress. HEENT: No cervical lymphadenopathy, unremarkable thyroid, sclerae anicteric, conjunctiva pink. RESPIRATORY: Respiratory effort normal, breath sounds equal bilaterally without rales, rhonchi, or wheezing. CARDIO: Regular rate and rhythm with no murmurs, rubs, or gallops, peripheral pulses intact. ABDOMEN: Soft, non-distended, active bowel sounds in all four quadrants, no tenderness to palpation, no rebound, no mass appreciated. RECTAL: Rectal exam shows healed  fissure, no blood in stool. MUSCULOSKELETAL: Full range of motion, normal gait, without edema. SKIN: Dry, intact without rashes or lesions. No jaundice. NEURO: Alert, oriented, no focal deficits. PSYCH: Cooperative, normal mood and affect.        Alan JONELLE Coombs, PA-C 10:21 AM

## 2024-03-31 ENCOUNTER — Ambulatory Visit: Payer: Self-pay | Admitting: Physician Assistant

## 2024-03-31 ENCOUNTER — Ambulatory Visit (INDEPENDENT_AMBULATORY_CARE_PROVIDER_SITE_OTHER)
Admission: RE | Admit: 2024-03-31 | Discharge: 2024-03-31 | Disposition: A | Discharge status: Home / Self Care | Source: Ambulatory Visit | Attending: Physician Assistant | Admitting: Physician Assistant

## 2024-03-31 ENCOUNTER — Other Ambulatory Visit (INDEPENDENT_AMBULATORY_CARE_PROVIDER_SITE_OTHER)

## 2024-03-31 ENCOUNTER — Ambulatory Visit: Admitting: Physician Assistant

## 2024-03-31 ENCOUNTER — Encounter: Payer: Self-pay | Admitting: Physician Assistant

## 2024-03-31 ENCOUNTER — Other Ambulatory Visit (HOSPITAL_COMMUNITY): Payer: Self-pay

## 2024-03-31 ENCOUNTER — Telehealth: Payer: Self-pay

## 2024-03-31 VITALS — BP 110/72 | HR 74 | Ht 65.0 in | Wt 145.0 lb

## 2024-03-31 DIAGNOSIS — R140 Abdominal distension (gaseous): Secondary | ICD-10-CM | POA: Diagnosis not present

## 2024-03-31 DIAGNOSIS — K5790 Diverticulosis of intestine, part unspecified, without perforation or abscess without bleeding: Secondary | ICD-10-CM | POA: Diagnosis not present

## 2024-03-31 DIAGNOSIS — K51019 Ulcerative (chronic) pancolitis with unspecified complications: Secondary | ICD-10-CM

## 2024-03-31 DIAGNOSIS — K5190 Ulcerative colitis, unspecified, without complications: Secondary | ICD-10-CM | POA: Diagnosis not present

## 2024-03-31 DIAGNOSIS — N9489 Other specified conditions associated with female genital organs and menstrual cycle: Secondary | ICD-10-CM | POA: Diagnosis not present

## 2024-03-31 DIAGNOSIS — K602 Anal fissure, unspecified: Secondary | ICD-10-CM | POA: Diagnosis not present

## 2024-03-31 DIAGNOSIS — K219 Gastro-esophageal reflux disease without esophagitis: Secondary | ICD-10-CM

## 2024-03-31 DIAGNOSIS — R682 Dry mouth, unspecified: Secondary | ICD-10-CM

## 2024-03-31 DIAGNOSIS — D649 Anemia, unspecified: Secondary | ICD-10-CM

## 2024-03-31 LAB — HEPATIC FUNCTION PANEL
ALT: 16 U/L (ref 0–35)
AST: 16 U/L (ref 0–37)
Albumin: 4.2 g/dL (ref 3.5–5.2)
Alkaline Phosphatase: 64 U/L (ref 39–117)
Bilirubin, Direct: 0 mg/dL (ref 0.0–0.3)
Total Bilirubin: 0.4 mg/dL (ref 0.2–1.2)
Total Protein: 6.6 g/dL (ref 6.0–8.3)

## 2024-03-31 LAB — CBC WITH DIFFERENTIAL/PLATELET
Basophils Absolute: 0.1 K/uL (ref 0.0–0.1)
Basophils Relative: 0.8 % (ref 0.0–3.0)
Eosinophils Absolute: 0.1 K/uL (ref 0.0–0.7)
Eosinophils Relative: 2 % (ref 0.0–5.0)
HCT: 35 % — ABNORMAL LOW (ref 36.0–46.0)
Hemoglobin: 11.8 g/dL — ABNORMAL LOW (ref 12.0–15.0)
Lymphocytes Relative: 22.5 % (ref 12.0–46.0)
Lymphs Abs: 1.4 K/uL (ref 0.7–4.0)
MCHC: 33.8 g/dL (ref 30.0–36.0)
MCV: 88.6 fl (ref 78.0–100.0)
Monocytes Absolute: 0.6 K/uL (ref 0.1–1.0)
Monocytes Relative: 9.5 % (ref 3.0–12.0)
Neutro Abs: 4.2 K/uL (ref 1.4–7.7)
Neutrophils Relative %: 65.2 % (ref 43.0–77.0)
Platelets: 219 K/uL (ref 150.0–400.0)
RBC: 3.95 Mil/uL (ref 3.87–5.11)
RDW: 13 % (ref 11.5–15.5)
WBC: 6.4 K/uL (ref 4.0–10.5)

## 2024-03-31 LAB — SEDIMENTATION RATE: Sed Rate: 21 mm/h (ref 0–30)

## 2024-03-31 LAB — BASIC METABOLIC PANEL WITH GFR
BUN: 19 mg/dL (ref 6–23)
CO2: 22 meq/L (ref 19–32)
Calcium: 8.9 mg/dL (ref 8.4–10.5)
Chloride: 104 meq/L (ref 96–112)
Creatinine, Ser: 0.89 mg/dL (ref 0.40–1.20)
GFR: 67 mL/min (ref >60.00)
Glucose, Bld: 95 mg/dL (ref 70–99)
Potassium: 4.7 meq/L (ref 3.5–5.1)
Sodium: 138 meq/L (ref 135–145)

## 2024-03-31 MED ORDER — BUDESONIDE ER 9 MG PO TB24: 9.0000 mg | ORAL_TABLET | Freq: Every day | ORAL | 0 refills | Status: DC

## 2024-03-31 NOTE — Telephone Encounter (Signed)
 Pharmacy Patient Advocate Encounter   Received notification from CoverMyMeds that prior authorization for Budesonide  ER 9MG  er tablets is required/requested.   Insurance verification completed.   The patient is insured through [employer] Medicare .   Per test claim: PA required; PA submitted to above mentioned insurance via CoverMyMeds Key/confirmation #/EOC [id number] Status is pending

## 2024-03-31 NOTE — Telephone Encounter (Signed)
 Pharmacy Patient Advocate Encounter  Received notification from [employer] [facility] Medicare that Prior Authorization for Budesonide  ER 9MG  er tablets has been APPROVED from 08-26-2023 to 05-30-2024   PA #/Case ID/Reference #: [id number]

## 2024-03-31 NOTE — Patient Instructions (Addendum)
 Your provider has requested that you go to the basement level for lab work before leaving today. Press B on the elevator. The lab is located at the first door on the left as you exit the elevator.  Your provider has requested that you have an abdominal x ray before leaving today. Please go to the basement floor to our Radiology department for the test.    FODMAP stands for fermentable oligo-, di-, mono-saccharides and polyols (1). These are the scientific terms used to classify groups of carbs that are difficult for our body to digest and that are notorious for triggering digestive symptoms like bloating, gas, loose stools and stomach pain.   You can try low FODMAP diet  - start with eliminating just one column at a time that you feel may be a trigger for you. - the table at the very bottom contains foods that are low in FODMAPs   Sometimes trying to eliminate the FODMAP's from your diet is difficult or tricky, if you are stuggling with trying to do the elimination diet you can try an enzyme.  There is a food enzymes that you sprinkle in or on your food that helps break down the FODMAP. You can read more about the enzyme by going to this site: https://fodzyme.com/    Due to recent changes in healthcare laws, you may see the results of your imaging and laboratory studies on MyChart before your provider has had a chance to review them.  We understand that in some cases there may be results that are confusing or concerning to you. Not all laboratory results come back in the same time frame and the provider may be waiting for multiple results in order to interpret others.  Please give us  48 hours in order for your provider to thoroughly review all the results before contacting the office for clarification of your results.    I appreciate the  opportunity to care for you  Thank You   [facility]

## 2024-04-01 ENCOUNTER — Ambulatory Visit

## 2024-04-01 ENCOUNTER — Other Ambulatory Visit

## 2024-04-01 DIAGNOSIS — R140 Abdominal distension (gaseous): Secondary | ICD-10-CM | POA: Diagnosis not present

## 2024-04-01 DIAGNOSIS — K51019 Ulcerative (chronic) pancolitis with unspecified complications: Secondary | ICD-10-CM

## 2024-04-01 DIAGNOSIS — D649 Anemia, unspecified: Secondary | ICD-10-CM

## 2024-04-01 LAB — IBC + FERRITIN
Ferritin: 81 ng/mL (ref 10.0–291.0)
Iron: 78 ug/dL (ref 42–145)
Saturation Ratios: 25.6 % (ref 20.0–50.0)
TIBC: 305.2 ug/dL (ref 250.0–450.0)
Transferrin: 218 mg/dL (ref 212.0–360.0)

## 2024-04-01 LAB — SJOGRENS SYNDROME-A EXTRACTABLE NUCLEAR ANTIBODY: SSA (Ro) (ENA) Antibody, IgG: <1 AI

## 2024-04-01 LAB — SJOGRENS SYNDROME-B EXTRACTABLE NUCLEAR ANTIBODY: SSB (La) (ENA) Antibody, IgG: <1 AI

## 2024-04-01 LAB — VITAMIN B12: Vitamin B-12: 380 pg/mL (ref 211–911)

## 2024-04-01 NOTE — Addendum Note (Signed)
 Addended by: CRAIG PALMA on: 04/01/2024 10:18 AM   Modules accepted: Level of Service

## 2024-04-04 ENCOUNTER — Ambulatory Visit: Payer: Self-pay | Admitting: Physician Assistant

## 2024-04-04 ENCOUNTER — Ambulatory Visit: Admitting: Physical Therapy

## 2024-04-04 NOTE — Therapy (Incomplete)
 OUTPATIENT PHYSICAL THERAPY VESTIBULAR TREATMENT     Patient Name: [name] MRN: [id number] DOB:September 19, 1955, 68 y.o., female Today's Date: 04/04/2024  END OF SESSION:    Past Medical History:  Diagnosis Date   Anxiety    Clostridioides difficile infection    Depression    High blood cholesterol    Migraine    Palpitations    Pancreatitis    Ulcerative colitis [facility])    Past Surgical History:  Procedure Laterality Date   APPENDECTOMY Right    BIOPSY  08/18/2019   Procedure: BIOPSY;  Surgeon: Charlanne Groom, MD;  Location: [facility];  Service: Endoscopy;;   COLONOSCOPY N/A 08/18/2019   Procedure: COLONOSCOPY;  Surgeon: Charlanne Groom, MD;  Location: [facility];  Service: Endoscopy;  Laterality: N/A;   COLONOSCOPY  12/12/2021   per Dr. Charlanne, no polyps, repeat in 5 yrs   ESOPHAGOGASTRODUODENOSCOPY N/A 08/18/2019   per Dr. Charlanne, showed GERD, neg for H pylori   POLYPECTOMY  08/18/2019   Procedure: POLYPECTOMY;  Surgeon: Charlanne Groom, MD;  Location: [facility];  Service: Endoscopy;;   TONSILLECTOMY     Patient Active Problem List   Diagnosis Date Noted   Vertigo 02/08/2024   Atypical chest pain 02/03/2024   Memory loss 12/02/2023   Dyslipidemia 11/09/2023   Aortic atherosclerosis (HCC) 11/09/2023   Primary hypertension 11/09/2023   GERD (gastroesophageal reflux disease) 12/22/2022   Pancolitis (HCC) 09/08/2019   Elevated lipase    Rectal bleeding    Acute pancreatitis 08/11/2019   Hypokalemia 08/11/2019   Anxiety with depression 08/11/2019   Skin cancer 10/13/2018   Tobacco abuse 04/14/2015   Insomnia 10/10/2011   Colitis 02/17/2011   PALPITATIONS, HX OF 10/29/2009    PCP: Johnny Garnette LABOR, MD  REFERRING PROVIDER: Johnny Garnette LABOR, MD   REFERRING DIAG: R42 (ICD-10-CM) - Vertigo   THERAPY DIAG:  No diagnosis found.  ONSET DATE: 02/08/2024 MD referral  Rationale for Evaluation and Treatment: Rehabilitation  SUBJECTIVE:   SUBJECTIVE  STATEMENT: ***Positional dizziness is fleeting and balance issues when walking in the dark.  Did not have the positional dizziness as bad this morning either and that aspect seems to have been improved even a few days before starting here.   Pt accompanied by: self  PERTINENT HISTORY: memory difficulties, HTN, GERD, anxiety, and depression, TMJ, hx of R RTC repair (following fall 2 years ago)  PAIN:  Are you having pain? 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 Yes. Number of falls 1-2  LIVING ENVIRONMENT: Lives with: lives with their spouse Lives in: House/apartment Stairs: No Has following equipment at home: None  PLOF: Independent and Leisure: retired early this year; enjoys gardening, travelling  PATIENT GOALS: To get rid of the dizziness  OBJECTIVE:    TODAY'S TREATMENT: 04/04/2024 Activity Comments                      TODAY'S TREATMENT: 03/30/24 Activity Comments  DGI 22/24  Left/Right Sidelying No nystagmus, no dizziness-slight symptoms to head movement in right sidelying  Seated VOR x 1 Increased speed to 120 bpm--notes no issues  Head Impulse Test No corrective saccade noted  M-CTSIB Condition 1: normal Condition 2: mild Condition 3: mild Condition 4: moderate-severe  Corner balance Feet together EO/EC x 30 sec Feet together head movements EO/EC x 30 sec  PATIENT EDUCATION: Education details: Eval results, POC Person educated: Patient Education method: Explanation Education comprehension: verbalized understanding  HOME EXERCISE PROGRAM: Access Code: [email or web address] URL: [email or web address] Date: 03/30/2024 Prepared by: Burnard Sandifer  Exercises - Brandt-Daroff Vestibular Exercise  - 1 x daily - 7 x weekly - 5 reps - Corner Balance Feet Together With Eyes Open  - 1 x daily - 7 x weekly - 3 sets - 30 sec hold - Corner  Balance Feet Together With Eyes Closed  - 1 x daily - 7 x weekly - 3 sets - 30 sec hold - Corner Balance Feet Together: Eyes Open With Head Turns  - 1 x daily - 7 x weekly - 3 sets - 30 sec hold - Corner Balance Feet Together: Eyes Closed With Head Turns  - 1 x daily - 7 x weekly - 3 sets - 30 sec hold Note: Objective measures were completed at Evaluation unless otherwise noted.  DIAGNOSTIC FINDINGS: MRI:  IMPRESSION: 1. No acute intracranial finding. 2. Several small chronic insults within the cerebral white matter, nonspecific but most often secondary to chronic small vessel ischemia. 3. Mild generalized parenchymal atrophy.  COGNITION: Overall cognitive status: Within functional limits for tasks assessed and undergoing testing in regards to memory   POSTURE:  rounded shoulders and forward head  Cervical ROM:    Active A/PROM (deg) eval  Flexion 30  Extension 30  Right lateral flexion   Left lateral flexion WFL  Right rotation WFL  Left rotation   (Blank rows = not tested)   TRANSFERS: Assistive device utilized: None  Sit to stand: Modified independence Stand to sit: Modified independence  GAIT: Gait pattern: step through pattern Distance walked: 50 ft  Assistive device utilized: None Level of assistance: Modified independence Comments: No overt LOB; further gait/balance testing needed  PATIENT SURVEYS:  DHI: THE DIZZINESS HANDICAP INVENTORY (DHI)  P1. Does looking up increase your problem? 4 = Yes  E2. Because of your problem, do you feel frustrated? 4 = Yes  F3. Because of your problem, do you restrict your travel for business or recreation?  2 = Sometimes  P4. Does walking down the aisle of a supermarket increase your problems?  0 = No  F5. Because of your problem, do you have difficulty getting into or out of bed?  4 = Yes  F6. Does your problem significantly restrict your participation in social activities, such as going out to dinner, going to the movies,  dancing, or going to parties? 2 = Sometimes  F7. Because of your problem, do you have difficulty reading?  2 = Sometimes  P8. Does performing more ambitious activities such as sports, dancing, household chores (sweeping or putting dishes away) increase your problems?  4 = Yes  E9. Because of your problem, are you afraid to leave your home without having without having someone accompany you?  0 = No  E10. Because of your problem have you been embarrassed in front of others?  2 = Sometimes  P11. Do quick movements of your head increase your problem?  4 = Yes  F12. Because of your problem, do you avoid heights?  0 = No  P13. Does turning over in bed increase your problem?  4 = Yes  F14. Because of your problem, is it difficult for you to do strenuous homework or yard work? 2 = Sometimes  E15. Because of your problem, are you afraid people may think you are intoxicated? 2 = Sometimes  F16. Because of  your problem, is it difficult for you to go for a walk by yourself?  0 = No  P17. Does walking down a sidewalk increase your problem?  0 = No  E18.Because of your problem, is it difficult for you to concentrate 2 = Sometimes  F19. Because of your problem, is it difficult for you to walk around your house in the dark? 2 = Sometimes  E20. Because of your problem, are you afraid to stay home alone?  2 = Sometimes  E21. Because of your problem, do you feel handicapped? NA  E22. Has the problem placed stress on your relationships with members of your family or friends? 2 = Sometimes  E23. Because of your problem, are you depressed?  2 = Sometimes  F24. Does your problem interfere with your job or household responsibilities?  2 = Sometimes  P25. Does bending over increase your problem?  4 = Yes  TOTAL 52    DHI Scoring Instructions  The patient is asked to answer each question as it pertains to dizziness or unsteadiness problems, specifically  considering their condition during the last month. Questions  are designed to incorporate functional (F), physical  (P), and emotional (E) impacts on disability.   Scores greater than 10 points should be referred to balance specialists for further evaluation.   16-34 Points (mild handicap)  36-52 Points (moderate handicap)  54+ Points (severe handicap)  Minimally Detectable Change: 17 points ([address] [place], 1990)  [place], G. SHAUNNA. and [place], C. W. (1990). The development of the Dizziness Handicap Inventory. Archives of Otolaryngology - Head and Neck Surgery 116(4): [id number].   VESTIBULAR ASSESSMENT:  GENERAL OBSERVATION: No acute distress   SYMPTOM BEHAVIOR:  Subjective history: Had a fall year before last and had surgery late 2023 for RTC repair.  Dizziness started about then.  Was given some type of antibiotic earlier in the year for GI issues, but then taken off.  Denies head injury, headaches, changes in hearing.  Reports she is to have cataract surgery in the next several months.  Does have hx of dry eyes and has tubes in tear ducts.  Describes spinning sensation with getting in and out of bed  Non-Vestibular symptoms: neck pain and nausea/vomiting  Type of dizziness: Imbalance (Disequilibrium), Spinning/Vertigo, and Unsteady with head/body turns  Frequency: daily  Duration: seconds  Aggravating factors: Induced by position change: lying supine and lying to right side and Induced by motion: looking up at the ceiling, turning body quickly, and turning head quickly  Relieving factors: head stationary and closing eyes  Progression of symptoms: worse  OCULOMOTOR EXAM:  Ocular Alignment: normal  Ocular ROM: No Limitations  Spontaneous Nystagmus: absent  Gaze-Induced Nystagmus: mild nystagmus at end range and coming from L to R  Smooth Pursuits: intact  Saccades: hypermetric/overshoots-  Convergence/Divergence: doesn't become double-L eye decreased convergence   VESTIBULAR - OCULAR REFLEX:   Slow VOR: Normal  VOR Cancellation:  Comment: rates mild dizziness 2-3/10  Head-Impulse Test: HIT Right: positive HIT Left: positive  Reports feeling dizzy after completion  Dynamic Visual Acuity: NT   POSITIONAL TESTING: Right Dix-Hallpike: no nystagmus Left Dix-Hallpike: no nystagmus and felt like whoosh Right Roll Test: no nystagmus Left Roll Test: no nystagmus R Loaded DH:  Negative L loaded DH:  Negative  MOTION SENSITIVITY: NOT TESTED AT EVAL  Motion Sensitivity Quotient Intensity: 0 = none, 1 = Lightheaded, 2 = Mild, 3 = Moderate, 4 = Severe, 5 = Vomiting  Intensity  1.  Sitting to supine   2. Supine to L side   3. Supine to R side   4. Supine to sitting   5. L Hallpike-Dix   6. Up from L    7. R Hallpike-Dix   8. Up from R    9. Sitting, head tipped to L knee   10. Head up from L knee   11. Sitting, head tipped to R knee   12. Head up from R knee   13. Sitting head turns x5   14.Sitting head nods x5   15. In stance, 180 turn to L    16. In stance, 180 turn to R       M-CTSIB  Condition 1: Firm Surface, EO 30 Sec, Mild Sway  Condition 2: Firm Surface, EC 25 Sec, Moderate and Severe Sway  Condition 3: Foam Surface, EO 30 Sec, Mild Sway  Condition 4: Foam Surface, EC 25 Sec, Moderate and Severe Sway                                                                                                                              TREATMENT DATE: 03/28/2024    GOALS: Goals reviewed with patient? Yes  SHORT TERM GOALS: Target date: 04/22/2024  Pt will be independent with HEP for improved dizziness and balance. Baseline: Goal status: INITIAL  2.  Pt will report 0/10 dizziness with bed mobility. Baseline:  Goal status: INITIAL  LONG TERM GOALS: Target date: 05/06/2024  Pt will be independent with HEP for improved balance, dizziness. Baseline:  Goal status: INITIAL  2.  DHI score to improve to less than or equal to 34, to demo improved dizziness with functional activities. Baseline: 52 Goal  status: INITIAL  3.  MCTSIB score to improve to 30 seconds on Condition 2 and 4, for improved vestibular system use for balance. Baseline:  Goal status: INITIAL  4.  Pt to score at least 20/24 on DGI for improved balance and gait.  Baseline: 22/24 Goal status: MET   ASSESSMENT:  CLINICAL IMPRESSION: Reports her positional dizziness has overall improved and had been improving even before initiating care here at our clinic.  Performed Dynamic Gait Index where she did well with score 22/24 indicating low risk for falls and the only problematic area was walking with horizontal head turns.  Right/Left sidelying test without provocation of vertigo or nystagmus and experience of slight motion sensitivity to right sidelying when initiating motion. Faster VOR x 1 without significant provocation and feels the blurred vision aspect is more from cataracts than dizziness.  M-CTSIB repeated with similar outcome with increased sway to conditions 2 and 4.  Instructed in corner balance activities for multisensory balance with significant difficulty with eyes closed and head movements with tendency for right LOB.  Provided HEP for reference of brandt-daroff for self-assessment and habituation and balance exercises to address deficits.   OBJECTIVE IMPAIRMENTS: decreased balance and dizziness.   ACTIVITY LIMITATIONS: bending,  squatting, bed mobility, reach over head, and locomotion level  PARTICIPATION LIMITATIONS: meal prep, cleaning, laundry, driving, shopping, community activity, and yard work  PERSONAL FACTORS: 3+ comorbidities: see above for PMH are also affecting patient's functional outcome.   REHAB POTENTIAL: Good  CLINICAL DECISION MAKING: Stable/uncomplicated  EVALUATION COMPLEXITY: Low   PLAN:  PT FREQUENCY: 1-2x/week  PT DURATION: 6 weeks  PLANNED INTERVENTIONS: 97750- Physical Performance Testing, 97110-Therapeutic exercises, 97530- Therapeutic activity, [id number]- Neuromuscular  re-education, 97535- Self Care, [id number]- Manual therapy, [id number]- Gait training, [phone]- Canalith repositioning, Patient/Family education, Balance training, and Vestibular training  PLAN FOR NEXT SESSION: ***Reassess BPPV for horizontal/posterior canals; try sidelying test and consider habituation with Brandt-Daroff.  Initiate multi-sensory balance exercises for HEP.  *If positive for BPPV, will need to update/recert with this diagnosis  Greig Anon, PT 04/04/24 7:51 AM Phone: [phone] Fax: [phone]  [facility] [address] [place], [address] [place], [place] [address] Phone # [phone] Fax # [phone]

## 2024-04-06 ENCOUNTER — Ambulatory Visit

## 2024-04-06 DIAGNOSIS — R2681 Unsteadiness on feet: Secondary | ICD-10-CM

## 2024-04-06 DIAGNOSIS — R42 Dizziness and giddiness: Secondary | ICD-10-CM | POA: Diagnosis not present

## 2024-04-06 NOTE — Therapy (Incomplete)
 OUTPATIENT PHYSICAL THERAPY VESTIBULAR TREATMENT     Patient Name: [name] MRN: [id number] DOB:15-May-1956, 68 y.o., female Today's Date: 04/06/2024  END OF SESSION:    Past Medical History:  Diagnosis Date   Anxiety    Clostridioides difficile infection    Depression    High blood cholesterol    Migraine    Palpitations    Pancreatitis    Ulcerative colitis [facility])    Past Surgical History:  Procedure Laterality Date   APPENDECTOMY Right    BIOPSY  08/18/2019   Procedure: BIOPSY;  Surgeon: Charlanne Groom, MD;  Location: [facility];  Service: Endoscopy;;   COLONOSCOPY N/A 08/18/2019   Procedure: COLONOSCOPY;  Surgeon: Charlanne Groom, MD;  Location: [facility];  Service: Endoscopy;  Laterality: N/A;   COLONOSCOPY  12/12/2021   per Dr. Charlanne, no polyps, repeat in 5 yrs   ESOPHAGOGASTRODUODENOSCOPY N/A 08/18/2019   per Dr. Charlanne, showed GERD, neg for H pylori   POLYPECTOMY  08/18/2019   Procedure: POLYPECTOMY;  Surgeon: Charlanne Groom, MD;  Location: [facility];  Service: Endoscopy;;   TONSILLECTOMY     Patient Active Problem List   Diagnosis Date Noted   Vertigo 02/08/2024   Atypical chest pain 02/03/2024   Memory loss 12/02/2023   Dyslipidemia 11/09/2023   Aortic atherosclerosis (HCC) 11/09/2023   Primary hypertension 11/09/2023   GERD (gastroesophageal reflux disease) 12/22/2022   Pancolitis (HCC) 09/08/2019   Elevated lipase    Rectal bleeding    Acute pancreatitis 08/11/2019   Hypokalemia 08/11/2019   Anxiety with depression 08/11/2019   Skin cancer 10/13/2018   Tobacco abuse 04/14/2015   Insomnia 10/10/2011   Colitis 02/17/2011   PALPITATIONS, HX OF 10/29/2009    PCP: Johnny Garnette LABOR, MD  REFERRING PROVIDER: Johnny Garnette LABOR, MD   REFERRING DIAG: R42 (ICD-10-CM) - Vertigo   THERAPY DIAG:  No diagnosis found.  ONSET DATE: 02/08/2024 MD referral  Rationale for Evaluation and Treatment: Rehabilitation  SUBJECTIVE:   SUBJECTIVE  STATEMENT: ***Positional dizziness is fleeting and balance issues when walking in the dark.  Did not have the positional dizziness as bad this morning either and that aspect seems to have been improved even a few days before starting here.   Pt accompanied by: self  PERTINENT HISTORY: memory difficulties, HTN, GERD, anxiety, and depression, TMJ, hx of R RTC repair (following fall 2 years ago)  PAIN:  Are you having pain? 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 Yes. Number of falls 1-2  LIVING ENVIRONMENT: Lives with: lives with their spouse Lives in: House/apartment Stairs: No Has following equipment at home: None  PLOF: Independent and Leisure: retired early this year; enjoys gardening, travelling  PATIENT GOALS: To get rid of the dizziness  OBJECTIVE:    TODAY'S TREATMENT: 04/06/2024 Activity Comments                      TODAY'S TREATMENT: 03/30/24 Activity Comments  DGI 22/24  Left/Right Sidelying No nystagmus, no dizziness-slight symptoms to head movement in right sidelying  Seated VOR x 1 Increased speed to 120 bpm--notes no issues  Head Impulse Test No corrective saccade noted  M-CTSIB Condition 1: normal Condition 2: mild Condition 3: mild Condition 4: moderate-severe  Corner balance Feet together EO/EC x 30 sec Feet together head movements EO/EC x 30 sec  PATIENT EDUCATION: Education details: Eval results, POC Person educated: Patient Education method: Explanation Education comprehension: verbalized understanding  HOME EXERCISE PROGRAM: Access Code: [email or web address] URL: [email or web address] Date: 03/30/2024 Prepared by: Burnard Sandifer  Exercises - Brandt-Daroff Vestibular Exercise  - 1 x daily - 7 x weekly - 5 reps - Corner Balance Feet Together With Eyes Open  - 1 x daily - 7 x weekly - 3 sets - 30 sec hold - Corner  Balance Feet Together With Eyes Closed  - 1 x daily - 7 x weekly - 3 sets - 30 sec hold - Corner Balance Feet Together: Eyes Open With Head Turns  - 1 x daily - 7 x weekly - 3 sets - 30 sec hold - Corner Balance Feet Together: Eyes Closed With Head Turns  - 1 x daily - 7 x weekly - 3 sets - 30 sec hold   Note: Objective measures were completed at Evaluation unless otherwise noted.  DIAGNOSTIC FINDINGS: MRI:  IMPRESSION: 1. No acute intracranial finding. 2. Several small chronic insults within the cerebral white matter, nonspecific but most often secondary to chronic small vessel ischemia. 3. Mild generalized parenchymal atrophy.  COGNITION: Overall cognitive status: Within functional limits for tasks assessed and undergoing testing in regards to memory   POSTURE:  rounded shoulders and forward head  Cervical ROM:    Active A/PROM (deg) eval  Flexion 30  Extension 30  Right lateral flexion   Left lateral flexion WFL  Right rotation WFL  Left rotation   (Blank rows = not tested)   TRANSFERS: Assistive device utilized: None  Sit to stand: Modified independence Stand to sit: Modified independence  GAIT: Gait pattern: step through pattern Distance walked: 50 ft  Assistive device utilized: None Level of assistance: Modified independence Comments: No overt LOB; further gait/balance testing needed  PATIENT SURVEYS:  DHI: THE DIZZINESS HANDICAP INVENTORY (DHI)  P1. Does looking up increase your problem? 4 = Yes  E2. Because of your problem, do you feel frustrated? 4 = Yes  F3. Because of your problem, do you restrict your travel for business or recreation?  2 = Sometimes  P4. Does walking down the aisle of a supermarket increase your problems?  0 = No  F5. Because of your problem, do you have difficulty getting into or out of bed?  4 = Yes  F6. Does your problem significantly restrict your participation in social activities, such as going out to dinner, going to the  movies, dancing, or going to parties? 2 = Sometimes  F7. Because of your problem, do you have difficulty reading?  2 = Sometimes  P8. Does performing more ambitious activities such as sports, dancing, household chores (sweeping or putting dishes away) increase your problems?  4 = Yes  E9. Because of your problem, are you afraid to leave your home without having without having someone accompany you?  0 = No  E10. Because of your problem have you been embarrassed in front of others?  2 = Sometimes  P11. Do quick movements of your head increase your problem?  4 = Yes  F12. Because of your problem, do you avoid heights?  0 = No  P13. Does turning over in bed increase your problem?  4 = Yes  F14. Because of your problem, is it difficult for you to do strenuous homework or yard work? 2 = Sometimes  E15. Because of your problem, are you afraid people may think you are intoxicated? 2 = Sometimes  F16.  Because of your problem, is it difficult for you to go for a walk by yourself?  0 = No  P17. Does walking down a sidewalk increase your problem?  0 = No  E18.Because of your problem, is it difficult for you to concentrate 2 = Sometimes  F19. Because of your problem, is it difficult for you to walk around your house in the dark? 2 = Sometimes  E20. Because of your problem, are you afraid to stay home alone?  2 = Sometimes  E21. Because of your problem, do you feel handicapped? NA  E22. Has the problem placed stress on your relationships with members of your family or friends? 2 = Sometimes  E23. Because of your problem, are you depressed?  2 = Sometimes  F24. Does your problem interfere with your job or household responsibilities?  2 = Sometimes  P25. Does bending over increase your problem?  4 = Yes  TOTAL 52    DHI Scoring Instructions  The patient is asked to answer each question as it pertains to dizziness or unsteadiness problems, specifically  considering their condition during the last month.  Questions are designed to incorporate functional (F), physical  (P), and emotional (E) impacts on disability.   Scores greater than 10 points should be referred to balance specialists for further evaluation.   16-34 Points (mild handicap)  36-52 Points (moderate handicap)  54+ Points (severe handicap)  Minimally Detectable Change: 17 points ([address] [place], 1990)  [place], G. SHAUNNA. and [place], C. W. (1990). The development of the Dizziness Handicap Inventory. Archives of Otolaryngology - Head and Neck Surgery 116(4): [id number].   VESTIBULAR ASSESSMENT:  GENERAL OBSERVATION: No acute distress   SYMPTOM BEHAVIOR:  Subjective history: Had a fall year before last and had surgery late 2023 for RTC repair.  Dizziness started about then.  Was given some type of antibiotic earlier in the year for GI issues, but then taken off.  Denies head injury, headaches, changes in hearing.  Reports she is to have cataract surgery in the next several months.  Does have hx of dry eyes and has tubes in tear ducts.  Describes spinning sensation with getting in and out of bed  Non-Vestibular symptoms: neck pain and nausea/vomiting  Type of dizziness: Imbalance (Disequilibrium), Spinning/Vertigo, and Unsteady with head/body turns  Frequency: daily  Duration: seconds  Aggravating factors: Induced by position change: lying supine and lying to right side and Induced by motion: looking up at the ceiling, turning body quickly, and turning head quickly  Relieving factors: head stationary and closing eyes  Progression of symptoms: worse  OCULOMOTOR EXAM:  Ocular Alignment: normal  Ocular ROM: No Limitations  Spontaneous Nystagmus: absent  Gaze-Induced Nystagmus: mild nystagmus at end range and coming from L to R  Smooth Pursuits: intact  Saccades: hypermetric/overshoots-  Convergence/Divergence: doesn't become double-L eye decreased convergence   VESTIBULAR - OCULAR REFLEX:   Slow VOR: Normal  VOR  Cancellation: Comment: rates mild dizziness 2-3/10  Head-Impulse Test: HIT Right: positive HIT Left: positive  Reports feeling dizzy after completion  Dynamic Visual Acuity: NT   POSITIONAL TESTING: Right Dix-Hallpike: no nystagmus Left Dix-Hallpike: no nystagmus and felt like whoosh Right Roll Test: no nystagmus Left Roll Test: no nystagmus R Loaded DH:  Negative L loaded DH:  Negative  MOTION SENSITIVITY: NOT TESTED AT EVAL  Motion Sensitivity Quotient Intensity: 0 = none, 1 = Lightheaded, 2 = Mild, 3 = Moderate, 4 = Severe, 5 = Vomiting  Intensity  1. Sitting to supine   2. Supine to L side   3. Supine to R side   4. Supine to sitting   5. L Hallpike-Dix   6. Up from L    7. R Hallpike-Dix   8. Up from R    9. Sitting, head tipped to L knee   10. Head up from L knee   11. Sitting, head tipped to R knee   12. Head up from R knee   13. Sitting head turns x5   14.Sitting head nods x5   15. In stance, 180 turn to L    16. In stance, 180 turn to R       M-CTSIB  Condition 1: Firm Surface, EO 30 Sec, Mild Sway  Condition 2: Firm Surface, EC 25 Sec, Moderate and Severe Sway  Condition 3: Foam Surface, EO 30 Sec, Mild Sway  Condition 4: Foam Surface, EC 25 Sec, Moderate and Severe Sway                                                                                                                              TREATMENT DATE: 03/28/2024    GOALS: Goals reviewed with patient? Yes  SHORT TERM GOALS: Target date: 04/22/2024  Pt will be independent with HEP for improved dizziness and balance. Baseline: Goal status: IN PROGRESS  2.  Pt will report 0/10 dizziness with bed mobility. Baseline:  Goal status: IN PROGRESS  LONG TERM GOALS: Target date: 05/06/2024  Pt will be independent with HEP for improved balance, dizziness. Baseline:  Goal status: IN PROGRESS  2.  DHI score to improve to less than or equal to 34, to demo improved dizziness with functional  activities. Baseline: 52 Goal status: IN PROGRESS  3.  MCTSIB score to improve to 30 seconds on Condition 2 and 4, for improved vestibular system use for balance. Baseline:  Goal status: IN PROGRESS  4.  Pt to score at least 20/24 on DGI for improved balance and gait.  Baseline: 22/24 Goal status: MET   ASSESSMENT:  CLINICAL IMPRESSION: Reports her positional dizziness has overall improved and had been improving even before initiating care here at our clinic.  Performed Dynamic Gait Index where she did well with score 22/24 indicating low risk for falls and the only problematic area was walking with horizontal head turns.  Right/Left sidelying test without provocation of vertigo or nystagmus and experience of slight motion sensitivity to right sidelying when initiating motion. Faster VOR x 1 without significant provocation and feels the blurred vision aspect is more from cataracts than dizziness.  M-CTSIB repeated with similar outcome with increased sway to conditions 2 and 4.  Instructed in corner balance activities for multisensory balance with significant difficulty with eyes closed and head movements with tendency for right LOB.  Provided HEP for reference of brandt-daroff for self-assessment and habituation and balance exercises to address deficits.   OBJECTIVE IMPAIRMENTS: decreased balance and  dizziness.   ACTIVITY LIMITATIONS: bending, squatting, bed mobility, reach over head, and locomotion level  PARTICIPATION LIMITATIONS: meal prep, cleaning, laundry, driving, shopping, community activity, and yard work  PERSONAL FACTORS: 3+ comorbidities: see above for PMH are also affecting patient's functional outcome.   REHAB POTENTIAL: Good  CLINICAL DECISION MAKING: Stable/uncomplicated  EVALUATION COMPLEXITY: Low   PLAN:  PT FREQUENCY: 1-2x/week  PT DURATION: 6 weeks  PLANNED INTERVENTIONS: 97750- Physical Performance Testing, 97110-Therapeutic exercises, 97530- Therapeutic  activity, [id number]- Neuromuscular re-education, 97535- Self Care, [id number]- Manual therapy, [id number]- Gait training, [phone]- Canalith repositioning, Patient/Family education, Balance training, and Vestibular training  PLAN FOR NEXT SESSION: ***Reassess BPPV for horizontal/posterior canals; try sidelying test and consider habituation with Brandt-Daroff.  Initiate multi-sensory balance exercises for HEP.  *If positive for BPPV, will need to update/recert with this diagnosis

## 2024-04-06 NOTE — Therapy (Signed)
 OUTPATIENT PHYSICAL THERAPY VESTIBULAR TREATMENT     Patient Name: [name] MRN: [id number] DOB:07-01-1956, 68 y.o., female Today's Date: 04/06/2024  END OF SESSION:  PT End of Session - 04/06/24 0935     Visit Number 3    Number of Visits 12    Date for PT Re-Evaluation 05/06/24    Authorization Type [employer]    Progress Note Due on Visit 10    PT Start Time 0935    PT Stop Time 1015    PT Time Calculation (min) 40 min    Activity Tolerance Patient tolerated treatment well    Behavior During Therapy [facility] for tasks assessed/performed          Past Medical History:  Diagnosis Date   Anxiety    Clostridioides difficile infection    Depression    High blood cholesterol    Migraine    Palpitations    Pancreatitis    Ulcerative colitis (HCC)    Past Surgical History:  Procedure Laterality Date   APPENDECTOMY Right    BIOPSY  08/18/2019   Procedure: BIOPSY;  Surgeon: Charlanne Groom, MD;  Location: [facility];  Service: Endoscopy;;   COLONOSCOPY N/A 08/18/2019   Procedure: COLONOSCOPY;  Surgeon: Charlanne Groom, MD;  Location: [facility];  Service: Endoscopy;  Laterality: N/A;   COLONOSCOPY  12/12/2021   per Dr. Charlanne, no polyps, repeat in 5 yrs   ESOPHAGOGASTRODUODENOSCOPY N/A 08/18/2019   per Dr. Charlanne, showed GERD, neg for H pylori   POLYPECTOMY  08/18/2019   Procedure: POLYPECTOMY;  Surgeon: Charlanne Groom, MD;  Location: [facility];  Service: Endoscopy;;   TONSILLECTOMY     Patient Active Problem List   Diagnosis Date Noted   Vertigo 02/08/2024   Atypical chest pain 02/03/2024   Memory loss 12/02/2023   Dyslipidemia 11/09/2023   Aortic atherosclerosis (HCC) 11/09/2023   Primary hypertension 11/09/2023   GERD (gastroesophageal reflux disease) 12/22/2022   Pancolitis (HCC) 09/08/2019   Elevated lipase    Rectal bleeding    Acute pancreatitis 08/11/2019   Hypokalemia 08/11/2019   Anxiety with depression 08/11/2019   Skin cancer 10/13/2018    Tobacco abuse 04/14/2015   Insomnia 10/10/2011   Colitis 02/17/2011   PALPITATIONS, HX OF 10/29/2009    PCP: Johnny Garnette LABOR, MD  REFERRING PROVIDER: Johnny Garnette LABOR, MD   REFERRING DIAG: R42 (ICD-10-CM) - Vertigo   THERAPY DIAG:  Dizziness and giddiness  Unsteadiness on feet  ONSET DATE: 02/08/2024 MD referral  Rationale for Evaluation and Treatment: Rehabilitation  SUBJECTIVE:   SUBJECTIVE STATEMENT: Felt quite nauseous after last session for about an hour and was off balance.  No vertigo episode with it.  Pt accompanied by: self  PERTINENT HISTORY: memory difficulties, HTN, GERD, anxiety, and depression, TMJ, hx of R RTC repair (following fall 2 years ago)  PAIN:  Are you having pain? 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 Yes. Number of falls 1-2  LIVING ENVIRONMENT: Lives with: lives with their spouse Lives in: House/apartment Stairs: No Has following equipment at home: None  PLOF: Independent and Leisure: retired early this year; enjoys gardening, travelling  PATIENT GOALS: To get rid of the dizziness  OBJECTIVE:    TODAY'S TREATMENT: 04/06/24 Activity Comments  Left sidelying/right sidelying No nystagmus/dizziness reported  Brandt-Daroff x 3 reps No issues  Roll test No nystagmus/no dizziness  Self Head Impulse No issues  Corner balance Feet together EO/EC and w/ head movements all planes        TODAY'S TREATMENT: 03/30/24 Activity Comments  DGI 22/24  Left/Right Sidelying No nystagmus, no dizziness-slight symptoms to head movement in right sidelying  Seated VOR x 1 Increased speed to 120 bpm--notes no issues  Head Impulse Test No corrective saccade noted  M-CTSIB Condition 1: normal Condition 2: mild Condition 3: mild Condition 4: moderate-severe  Corner balance Feet together EO/EC x 30 sec Feet together head movements EO/EC x 30 sec          PATIENT EDUCATION: Education details: Eval results, POC Person educated: Patient Education method: Explanation Education comprehension: verbalized understanding  HOME EXERCISE PROGRAM: Access Code: [email or web address] URL: [email or web address] Date: 03/30/2024 Prepared by: Burnard Sandifer  Exercises - Brandt-Daroff Vestibular Exercise  - 1 x daily - 7 x weekly - 5 reps - Corner Balance Feet Together With Eyes Open  - 1 x daily - 7 x weekly - 3 sets - 30 sec hold - Corner Balance Feet Together With Eyes Closed  - 1 x daily - 7 x weekly - 3 sets - 30 sec hold - Corner Balance Feet Together: Eyes Open With Head Turns  - 1 x daily - 7 x weekly - 3 sets - 30 sec hold - Corner Balance Feet Together: Eyes Closed With Head Turns  - 1 x daily - 7 x weekly - 3 sets - 30 sec hold  Note: Objective measures were completed at Evaluation unless otherwise noted.  DIAGNOSTIC FINDINGS: MRI:  IMPRESSION: 1. No acute intracranial finding. 2. Several small chronic insults within the cerebral white matter, nonspecific but most often secondary to chronic small vessel ischemia. 3. Mild generalized parenchymal atrophy.  COGNITION: Overall cognitive status: Within functional limits for tasks assessed and undergoing testing in regards to memory   POSTURE:  rounded shoulders and forward head  Cervical ROM:    Active A/PROM (deg) eval  Flexion 30  Extension 30  Right lateral flexion   Left lateral flexion WFL  Right rotation WFL  Left rotation   (Blank rows = not tested)   TRANSFERS: Assistive device utilized: None  Sit to stand: Modified independence Stand to sit: Modified independence  GAIT: Gait pattern: step through pattern Distance walked: 50 ft  Assistive device utilized: None Level of assistance: Modified independence Comments: No overt LOB; further gait/balance testing needed  PATIENT SURVEYS:  DHI: THE DIZZINESS HANDICAP INVENTORY (DHI)  P1. Does looking up  increase your problem? 4 = Yes  E2. Because of your problem, do you feel frustrated? 4 = Yes  F3. Because of your problem, do you restrict your travel for business or recreation?  2 = Sometimes  P4. Does walking down the aisle of a supermarket increase your problems?  0 = No  F5. Because of your problem, do you have difficulty getting into or out of bed?  4 = Yes  F6. Does your problem significantly restrict your participation in social activities, such as going out to dinner, going to the movies, dancing, or going to parties? 2 = Sometimes  F7. Because of your problem, do you have difficulty reading?  2 = Sometimes  P8. Does performing more ambitious activities such as sports, dancing, household chores (sweeping or putting dishes away) increase your problems?  4 = Yes  E9. Because of your problem, are you afraid to leave your home without having without having someone accompany you?  0 =  No  E10. Because of your problem have you been embarrassed in front of others?  2 = Sometimes  P11. Do quick movements of your head increase your problem?  4 = Yes  F12. Because of your problem, do you avoid heights?  0 = No  P13. Does turning over in bed increase your problem?  4 = Yes  F14. Because of your problem, is it difficult for you to do strenuous homework or yard work? 2 = Sometimes  E15. Because of your problem, are you afraid people may think you are intoxicated? 2 = Sometimes  F16. Because of your problem, is it difficult for you to go for a walk by yourself?  0 = No  P17. Does walking down a sidewalk increase your problem?  0 = No  E18.Because of your problem, is it difficult for you to concentrate 2 = Sometimes  F19. Because of your problem, is it difficult for you to walk around your house in the dark? 2 = Sometimes  E20. Because of your problem, are you afraid to stay home alone?  2 = Sometimes  E21. Because of your problem, do you feel handicapped? NA  E22. Has the problem placed stress on  your relationships with members of your family or friends? 2 = Sometimes  E23. Because of your problem, are you depressed?  2 = Sometimes  F24. Does your problem interfere with your job or household responsibilities?  2 = Sometimes  P25. Does bending over increase your problem?  4 = Yes  TOTAL 52    DHI Scoring Instructions  The patient is asked to answer each question as it pertains to dizziness or unsteadiness problems, specifically  considering their condition during the last month. Questions are designed to incorporate functional (F), physical  (P), and emotional (E) impacts on disability.   Scores greater than 10 points should be referred to balance specialists for further evaluation.   16-34 Points (mild handicap)  36-52 Points (moderate handicap)  54+ Points (severe handicap)  Minimally Detectable Change: 17 points ([address] [place], 1990)  [place], G. SHAUNNA. and [place], C. W. (1990). The development of the Dizziness Handicap Inventory. Archives of Otolaryngology - Head and Neck Surgery 116(4): [id number].   VESTIBULAR ASSESSMENT:  GENERAL OBSERVATION: No acute distress   SYMPTOM BEHAVIOR:  Subjective history: Had a fall year before last and had surgery late 2023 for RTC repair.  Dizziness started about then.  Was given some type of antibiotic earlier in the year for GI issues, but then taken off.  Denies head injury, headaches, changes in hearing.  Reports she is to have cataract surgery in the next several months.  Does have hx of dry eyes and has tubes in tear ducts.  Describes spinning sensation with getting in and out of bed  Non-Vestibular symptoms: neck pain and nausea/vomiting  Type of dizziness: Imbalance (Disequilibrium), Spinning/Vertigo, and Unsteady with head/body turns  Frequency: daily  Duration: seconds  Aggravating factors: Induced by position change: lying supine and lying to right side and Induced by motion: looking up at the ceiling, turning body quickly, and  turning head quickly  Relieving factors: head stationary and closing eyes  Progression of symptoms: worse  OCULOMOTOR EXAM:  Ocular Alignment: normal  Ocular ROM: No Limitations  Spontaneous Nystagmus: absent  Gaze-Induced Nystagmus: mild nystagmus at end range and coming from L to R  Smooth Pursuits: intact  Saccades: hypermetric/overshoots-  Convergence/Divergence: doesn't become double-L eye decreased convergence   VESTIBULAR -  OCULAR REFLEX:   Slow VOR: Normal  VOR Cancellation: Comment: rates mild dizziness 2-3/10  Head-Impulse Test: HIT Right: positive HIT Left: positive  Reports feeling dizzy after completion  Dynamic Visual Acuity: NT   POSITIONAL TESTING: Right Dix-Hallpike: no nystagmus Left Dix-Hallpike: no nystagmus and felt like whoosh Right Roll Test: no nystagmus Left Roll Test: no nystagmus R Loaded DH:  Negative L loaded DH:  Negative  MOTION SENSITIVITY: NOT TESTED AT EVAL  Motion Sensitivity Quotient Intensity: 0 = none, 1 = Lightheaded, 2 = Mild, 3 = Moderate, 4 = Severe, 5 = Vomiting  Intensity  1. Sitting to supine   2. Supine to L side   3. Supine to R side   4. Supine to sitting   5. L Hallpike-Dix   6. Up from L    7. R Hallpike-Dix   8. Up from R    9. Sitting, head tipped to L knee   10. Head up from L knee   11. Sitting, head tipped to R knee   12. Head up from R knee   13. Sitting head turns x5   14.Sitting head nods x5   15. In stance, 180 turn to L    16. In stance, 180 turn to R       M-CTSIB  Condition 1: Firm Surface, EO 30 Sec, Mild Sway  Condition 2: Firm Surface, EC 25 Sec, Moderate and Severe Sway  Condition 3: Foam Surface, EO 30 Sec, Mild Sway  Condition 4: Foam Surface, EC 25 Sec, Moderate and Severe Sway                                                                                                                              TREATMENT DATE: 03/28/2024    GOALS: Goals reviewed with patient? Yes  SHORT TERM  GOALS: Target date: 04/22/2024  Pt will be independent with HEP for improved dizziness and balance. Baseline: Goal status: INITIAL  2.  Pt will report 0/10 dizziness with bed mobility. Baseline: 0/10 Goal status: MET  LONG TERM GOALS: Target date: 05/06/2024  Pt will be independent with HEP for improved balance, dizziness. Baseline:  Goal status: INITIAL  2.  DHI score to improve to less than or equal to 34, to demo improved dizziness with functional activities. Baseline: 52 Goal status: INITIAL  3.  MCTSIB score to improve to 30 seconds on Condition 2 and 4, for improved vestibular system use for balance. Baseline:  Goal status: MET  4.  Pt to score at least 20/24 on DGI for improved balance and gait.  Baseline: 22/24 Goal status: MET   ASSESSMENT:  CLINICAL IMPRESSION: Reports feeling somewhat affected after last session with nausea/imbalance but no reappearance of positional vertigo.  Pt denies any positional provocation to dizziness and asymptomatic to VOR testing and demo improved static balance to multisensory conditions.  Pt requests hold at this time as symptoms have largely resolved.  We  discussed recurrence rates and will leave chart open through certification date in case this happens.     OBJECTIVE IMPAIRMENTS: decreased balance and dizziness.   ACTIVITY LIMITATIONS: bending, squatting, bed mobility, reach over head, and locomotion level  PARTICIPATION LIMITATIONS: meal prep, cleaning, laundry, driving, shopping, community activity, and yard work  PERSONAL FACTORS: 3+ comorbidities: see above for PMH are also affecting patient's functional outcome.   REHAB POTENTIAL: Good  CLINICAL DECISION MAKING: Stable/uncomplicated  EVALUATION COMPLEXITY: Low   PLAN:  PT FREQUENCY: 1-2x/week  PT DURATION: 6 weeks  PLANNED INTERVENTIONS: 97750- Physical Performance Testing, 97110-Therapeutic exercises, 97530- Therapeutic activity, [id number]- Neuromuscular re-education,  97535- Self Care, [id number]- Manual therapy, [id number]- Gait training, [phone]- Canalith repositioning, Patient/Family education, Balance training, and Vestibular training  PLAN FOR NEXT SESSION: Reassess BPPV for horizontal/posterior canals; try sidelying test and consider habituation with Brandt-Daroff.  Initiate multi-sensory balance exercises for HEP.  *If positive for BPPV, will need to update/recert with this diagnosis   9:36 AM, 04/06/24 M. Kelly Loman Logan, PT, DPT Physical Therapist- [place] Office Number: [phone]

## 2024-04-07 LAB — CALPROTECTIN: Calprotectin: 21 ug/g

## 2024-04-11 ENCOUNTER — Ambulatory Visit: Admitting: Physical Therapy

## 2024-04-13 ENCOUNTER — Ambulatory Visit

## 2024-04-15 ENCOUNTER — Other Ambulatory Visit: Payer: Self-pay | Admitting: Family Medicine

## 2024-04-15 DIAGNOSIS — F330 Major depressive disorder, recurrent, mild: Secondary | ICD-10-CM

## 2024-04-22 ENCOUNTER — Other Ambulatory Visit: Payer: Self-pay | Admitting: Family Medicine

## 2024-05-04 ENCOUNTER — Ambulatory Visit: Payer: Self-pay | Admitting: Psychology

## 2024-05-04 ENCOUNTER — Ambulatory Visit (INDEPENDENT_AMBULATORY_CARE_PROVIDER_SITE_OTHER): Admitting: Psychology

## 2024-05-04 DIAGNOSIS — F418 Other specified anxiety disorders: Secondary | ICD-10-CM

## 2024-05-04 DIAGNOSIS — R4189 Other symptoms and signs involving cognitive functions and awareness: Secondary | ICD-10-CM

## 2024-05-04 NOTE — Telephone Encounter (Signed)
 Disregard

## 2024-05-04 NOTE — Progress Notes (Signed)
   Psychometrician Note   Cognitive testing was administered to Emelly L Tennison by Lonell Jude, B.S. (psychometrist) under the supervision of Dr. Renda Beckwith, Psy.D., licensed psychologist on 05/04/2024. Ms. Guerrier did not appear overtly distressed by the testing session per behavioral observation or responses across self-report questionnaires. Rest breaks were offered.    The battery of tests administered was selected by Dr. Renda Beckwith, Psy.D. with consideration to Ms. Virginia's current level of functioning, the nature of her symptoms, emotional and behavioral responses during interview, level of literacy, observed level of motivation/effort, and the nature of the referral question. This battery was communicated to the psychometrist. Communication between Dr. Renda Beckwith, Psy.D. and the psychometrist was ongoing throughout the evaluation and Dr. Renda Beckwith, Psy.D. was immediately accessible at all times. Dr. Renda Beckwith, Psy.D. provided supervision to the psychometrist on the date of this service to the extent necessary to assure the quality of all services provided.    Daisa L Giangrande will return within approximately 1-2 weeks for an interactive feedback session with Dr. Beckwith at which time her test performances, clinical impressions, and treatment recommendations will be reviewed in detail. Ms. Bari understands she can contact our office should she require our assistance before this time.  A total of 110 minutes of billable time were spent face-to-face with Ms. Mullinix by the psychometrist. This includes both test administration and scoring time. Billing for these services is reflected in the clinical report generated by Dr. Renda Beckwith, Psy.D.  This note reflects time spent with the psychometrician and does not include test scores or any clinical interpretations made by Dr. Beckwith. The full report will follow in a separate note.

## 2024-05-05 NOTE — Progress Notes (Signed)
 NEUROPSYCHOLOGICAL EVALUATION [place]. [facility]  [facility] Department of Neurology  Date of Evaluation: 05/04/2024  REASON FOR REFERRAL   [name] is a 69 year old, right-handed, White female with 14 years of formal education. She was referred for neuropsychological evaluation by Camie Sevin, PA-C, to assess current neurocognitive functioning, document potential cognitive deficits, and assist with treatment planning. This is her first neuropsychological evaluation.  SUMMARY OF RESULTS   Premorbid cognitive abilities are estimated to be in the average range based on word reading and sociodemographic factors. Relative to this baseline estimate, performance today was largely within expectations, with the exception of some variability noted across tasks of executive functioning and learning/memory.  Specifically, she demonstrated difficulty with novel problem-solving, identifying only one solution set and making slightly more errors than expected. Regarding learning/memory, performance was poor when encoding, recalling, and recognizing a word list. Immediate recall and recognition of short stories were relatively better, but delayed recall was low. Both immediate and delayed recall of shapes were poor, while recognition remained relatively preserved.  All other measures of attention/working memory, processing speed, executive functioning, language, and visuospatial abilities were intact.   On self-report questionnaires, she endorsed moderate symptoms of anxiety and mild symptoms of depression.  DIAGNOSTIC IMPRESSION   Results of the current evaluation indicated relative weaknesses in learning/memory and novel problem-solving. Functional independence is maintained. At this time, a diagnosis of mild cognitive impairment is deferred to allow for further longitudinal monitoring. Patient is experiencing a high degree of stress and has known depression and anxiety. She is not sleeping  as well. She is also caring for her mother. These factors can certainly exacerbate cognitive difficulties, especially attention. While objective testing did not reveal significant attention deficits, several concerns she described during the clinical interview appeared related to lapses in attention. It is also important to recognize that the structured and supportive nature of the testing environment may not fully capture the attention challenges she experiences in daily life. That said, her memory difficulties are concerning enough to warrant close and continued monitoring to rule out the possibility of an emerging neurodegenerative condition.  ICD-10 Codes: R41.89 Cognitive changes; F41.8 Depression with anxiety  RECOMMENDATIONS   A repeat neuropsychological evaluation in 12-18 months is recommended.  Continue treatment for depression and anxiety, especially given that emotional distress can exacerbate cognitive difficulties. Discuss current medication regimen with your prescribing provider to ensure you are receiving maximum benefit. To help you find a counselor who accepts your insurance, you can start by contacting your insurance company directly--either through the customer service number on your insurance card or via their website. They usually have a provider directory listing mental health professionals who are in-network. Additionally, websites like Psychology Today have searchable directories where you can filter therapists by location, specialty, and insurance accepted.  Recognize and address caregiver burden. Caring for a loved one can be overwhelming and stressful. It is important for caregivers to prioritize their own physical and emotional health to sustain their caregiving role.  Take regular breaks and consider respite care. Short breaks from caregiving duties can help reduce stress and prevent burnout. Explore options such as adult day programs, respite services, or support from other  family members or community resources. Connect with caregiver support groups. Sharing experiences with others who understand your situation can provide emotional support, practical advice, and reduce feelings of isolation. Many groups meet in person or online and can be found through local agencies, hospitals, or organizations. Seek professional counseling if needed.  Caregivers may experience anxiety, depression, or burnout. Therapy or counseling can provide coping strategies and emotional support. Utilize community resources and support services. Social workers or case managers can help identify helpful services, such as home health aides, transportation assistance, or financial support, to lighten caregiving demands. Practice stress management techniques. Engage in activities that promote relaxation, such as mindfulness meditation, deep breathing exercises, yoga, or hobbies you enjoy.  Given ongoing sleep difficulties, she may benefit from the implementation of sleep hygiene techniques, including:  Go to bed and get up at the same time each day to help your body establish a regular rhythm. Establish and maintain a bedtime routine. Certain activities such as stretching, meditating, listening to soft music, or reading ~15 minutes before bedtime can be a great way to regularly get your brain and body ready for sleep. Avoid taking naps during the day. Avoid alcohol and caffeine for 5 or 6 hours before going to bed. Get regular exercise, but not in the hours before bedtime. Use comfortable bedding and maintain a cool temperature in your bedroom. Block out light and distracting noise. Avoid watching television or using your phone/computer in bed. Avoid staying in bed if you have difficulty falling asleep. If you have not been able to get to sleep after about 20 minutes or more, get up and do something calming or boring until you feel sleepy, then return to bed and try again.  Prioritize physical health  through diet, exercise, and sleep. Regular physical activity supports cardiovascular health, improves mood, and helps preserve mobility and independence. Aim for at least 150 minutes of moderate aerobic exercise per week (e.g., brisk walking, swimming, gardening). A brain-healthy diet such as the Mediterranean or MIND diet is rich in fruits, vegetables, whole grains, healthy fats, and lean proteins, and has been associated with reduced risk of cognitive decline. Additionally, getting adequate, quality sleep and managing chronic conditions with the help of healthcare providers are essential components of healthy aging.  Continue to stay socially and mentally engaged. Maintaining strong social connections and regularly stimulating your brain can help protect against cognitive decline. This includes staying connected with friends and family, volunteering, or participating in community groups. Mentally engaging activities--such as reading, doing puzzles, playing strategy games, or learning a new language or musical instrument--promote brain plasticity. If you are interested in activities to support cognitive engagement, this site offers a variety of apps and games organized by difficulty level:  https://www.barrowne[employer].org/get-to-know-barrow/centers-programs/neurorehabilitation-center/neuro-rehab-apps-and-games/  Consider implementing compensatory strategies to maximize independence and maintain daily functioning. Examples include:  Adhere to routine. Compensatory strategies work best when they are used consistently. Use a planner, calendar, or white board that has the schedule and important events for the day clearly listed to reference and cross off when tasks are complete.  Ask for written information, especially if it is new or unfamiliar (e.g., information provided at a doctor's appointment).  Create an organized environment. Keep items that can be easily misplaced in a sensible location and get into the  habit of always returning the items to those places. Pay attention and reduce distractions. Make a point of focusing attention on information you want to remember. One-on-one interaction is more likely to facilitate attention and minimize distraction. Make eye contact and repeat the information out loud after you hear it. Reduce interruptions or distractions especially when attempting to learn new information.  Create associations. When learning something new, think about and understand the information. Explain it in your own words or try to associate it with  something you already know. Take notes to help remember important details. Evaluate goals and plan accordingly. When confronted by many different tasks, begin by making a list that prioritizes each task and estimates the time it will take to complete. Break down complicated tasks into smaller, more manageable steps. Focus on one task at a time and complete each task before starting another. Avoid multitasking.  DISPOSITION   Patient will follow up with the referring provider, Ms. Wertman. She should return for repeat neuropsychological testing in 12-18 months to monitor her course and assist with diagnosis and treatment planning. She will be provided verbal feedback in approximately one week regarding the findings and impression during this visit.  The remainder of the report includes the details of the patient's background and a table of results from the current evaluation, which support the summary and recommendations described above.  BACKGROUND   History of Presenting Illness: The following information was obtained from a review of medical records and an interview with the patient. Briefly, the patient established care with Camie Sevin, PA-C, at [facility] on 12/24/2023 because of gradually progressive memory difficulties over the last two years. MoCA = 26/30. She was referred for neuropsychological evaluation accordingly.  Cognitive  Functioning: During today's appointment, the patient reported cognitive difficulties over the past four to five years, which appear to have worsened in the last couple of years. She describes difficulties primarily with attention and short-term memory, such as reduced concentration, trouble remembering details of conversations and names, and losing her train of thought during conversations. Sometimes her children remind her of information previously discussed, but these prompts do not always aid her recall. She noted occasionally using incorrect words in conversation; she emphasized that, in the past, these substitutions were generally semantically related, but this is no longer consistently the case. She has also noticed a decline in efficiency in executive functions, including planning, organizing, and problem-solving. She denied issues with comprehension and navigation.  Physical Functioning: Patient difficulty initiating sleep, describing her sleep quality as poor. She previously benefited from Xanax  but discontinued it due to concerns about potential side effects. She has recently started taking lorazepam , which she feels is less effective. She noted an increase in appetite and a weight gain of approximately 15 to 20 pounds since her retirement. She is uncertain if this is related to eating more meals now that she is not as busy working. No changes to sense of taste or smell were reported. She reported a decline in vision over the past year and is scheduled for cataract surgery tomorrow. Hearing remains stable. She described feeling less steady on her feet. She participated in physical therapy in the past. She denied any recent falls but acknowledged multiple almost falls. She denied any tremors.  Emotional Functioning: Patient described her recent mood as sad and significantly stressed out. She reported feeling as though she is "faking" her way through life currently, with stress being a major  concern. She has experienced some recent suicidal thoughts but firmly denies any plan or intent. She expressed interest in counseling but is currently prioritizing completion of multiple medical appointments scheduled since her retirement. She plans to pursue counseling once her schedule becomes more manageable.  Imaging: MRI of the brain (01/12/2024) documented mild generalized parenchymal atrophy and several small chronic insults within the cerebral white matter, nonspecific but most often secondary to chronic small vessel ischemia.  Other Relevant Medical History: Remarkable for hypertension, aortic atherosclerosis, dyslipidemia, pancolitis, and gastroesophageal reflux disease. Please refer  to the medical record for a more comprehensive problem list. No history of stroke, CNS infection, head injury, or seizure was reported.  Current Medications: Per patient, atorvastatin , budesonide , bupropion , lorazepam , losartan , mesalamine , multivitamin, ondansetron , pantoprazole , and sertraline .   Functional Status: Patient independently performs all basic and instrumental (e.g., driving, finances, medications, meal preparation) activities of daily living without difficulty.  Family Neurological History: Patient reported late-life onset of cognitive decline in her mother in the setting; of chronic mental health issues.  Psychiatric History: Remarkable for depression and anxiety, currently managed with medication. History of counseling, suicidal ideation, hallucinations, and psychiatric hospitalizations was not reported.  Substance Use History: Patient reported drinking alcohol socially. She quit smoking this past March. She uses CBD gummies but otherwise denied any illicit substance use.   Social and Developmental History: Patient was born in [place]  and raised in [place] . History of perinatal complications and developmental delays was not reported. She has been married for 49 years and has three children.  She lives with her husband in their private residence.  Educational and Occupational History: No history of childhood learning disability, special education services, or grade retention was reported. Patient reported that she always performed well in school. She earned an associate degree in business and was employed in Systems developer for 25 years. She retired in December 2024 due to several medical concerns within her family and the responsibility of caring for her mother.  BEHAVIORAL OBSERVATIONS   Patient arrived on time and was unaccompanied. She ambulated independently and without gait disturbance. She was alert and fully oriented. She was appropriately groomed and dressed for the setting. No significant sensory or motor abnormalities were observed. Vision and hearing were adequate for testing purposes. Speech was of normal rate, prosody, and volume. No conversational word-finding difficulties, paraphasic errors, or dysarthria were observed. Comprehension was conversationally intact. Thought processes were linear, logical, and coherent. Thought content was organized and devoid of delusions. Insight appeared appropriate. Affect was even and congruent with euthymic mood. She was cooperative and gave adequate effort during testing, including on standalone and embedded measures of performance validity. Results are thought to accurately reflect her cognitive functioning at this time.  NEUROPSYCHOLOGICAL TESTING RESULTS   Tests Administered: Animal Naming Test; Brief Visuospatial Memory Test-Revised (BVMT-R) - Form 1; Controlled Oral Word Association Test (COWAT): FAS; Geriatric Anxiety Scale-10 Item (GAS-10); Geriatric Depression Scale Short Form (GDS-SF); Hopkins Verbal Learning Test Revised (HVLT-R) - From 1; Judgment of Line Orientation (JLO) - Form H; Neuropsychological Assessment Battery (NAB) - Subtest(s): Naming Form 1; Standalone performance validity tests (PVTs); Test of Premorbid Functioning  (TOPF); Trail Making Test (TMT); Wechsler Adult Intelligence Scale Fifth Edition (WAIS-5) - Subtest(s): Similarities, Clinical cytogeneticist, Matrix Reasoning, Digit Sequencing, Coding, Running Digits, Symbol Search, Symbol Span; Wechsler Memory Scale Fourth Edition (WMS-IV) - Subtest(s): Logical Memory (LM); and [place]  Card Sorting Test 64 Card Version (WCST-64).  Test results are provided in the table below. Whenever possible, the patient's scores were compared against age-, sex-, and education-corrected normative samples. Interpretive descriptions are based on the AACN consensus conference statement on uniform labeling (Guilmette et al., 2020).  PREMORBID FUNCTIONING RAW  RANGE  TOPF 44 StdS=101 Average  ATTENTION & WORKING MEMORY RAW  RANGE  WAIS-5 Digit Sequencing -- ss=9 Average  WAIS-5 Running Digits -- ss=10 Average  WAIS-5 Symbol Span -- ss=6 Low Average  PROCESSING SPEED RAW  RANGE  Trails A 17''0e T=73 Exceptionally High  WAIS-5 Coding  -- ss=9 Average  WAIS-5 Symbol Search --  ss=10 Average  EXECUTIVE FUNCTION RAW  RANGE  Trails B 59''0e T=58 High Average  WAIS-5 Matrix Reasoning -- ss=7 Low Average  WAIS-5 Similarities -- ss=9 Average  COWAT Letter Fluency 17+13+17 T=55 Average  WCST-64 Total Errors 27 T=37 Low Average  WCST-64 Perseverative Errors 12 T=43 Average  WCST-64 Nonperseverative Errors 15 T=34 Below Average  WCST-64 Categories Completed 1 6-10%ile Below Average to Low Average  WCSR-64 FMS 0 -- --  LANGUAGE RAW  RANGE  BDAE Complex Ideational Material 12/12 T=56 Average  COWAT Letter Fluency 17+13+17 T=55 Average  Animal Naming Test 24 T=59 High Average  NAB Naming Test 30/31 T=56 WNL  VISUOSPATIAL RAW  RANGE  WAIS-5 Block Design -- ss=9 Average  JLO C/S=28/30 72%ile Average  BVMT-R Copy Trial 12/12 -- WNL  VERBAL LEARNING & MEMORY RAW  RANGE  HVLT Learning Trials (2+5+7)/36 T=22 Exceptionally Low  HVLT Delayed Recall 6/12 T=32 Below Average  HVLT Recognition Hits  10 -- --  HVLT Recognition False Positives 4 -- --  HVLT Discrimination Index 6 T=17 Exceptionally Low  WMS-IV LM-I  (5+9+7)/53 ss=6 Low Average  WMS-IV LM-II  (4+4)/39 ss=5 Below Average  WMS-IV LM Recognition  (7+13)/23 51-75%ile Average  VISUAL LEARNING & MEMORY RAW  RANGE  BVMT-R Total Recall (0+2+5)/36 T=24 Exceptionally Low  BVMT-R Delayed Recall 5/12 T=36 Below Average  BVMT-R Percent Retained 100 >16%ile WNL  BVMT-R Recognition Hits 5 >16%ile WNL  BVMT-R Recognition False Alarms 1 11-16%ile Low Average  BVMT-R Recognition Discrimination Index 4 11-16%ile Low Average  QUESTIONNAIRES RAW  RANGE  GDS-SF 6 -- Mild  GAS-10 10 -- Moderate  *Note: ss = scaled score; StdS = standard score; T = t-score; C/S = corrected raw score; WNL = within normal limits; BNL= below normal limits; D/C = discontinued. Scores from skewed distributions are typically interpreted as WNL (>=16th %ile) or BNL (<16th %ile).   INFORMED CONSENT   Patient was provided with a verbal description of the nature and purpose of the neuropsychological evaluation. Also reviewed were the foreseeable risks and/or discomforts and benefits of the procedure, limits of confidentiality, and mandatory reporting requirements of this provider. Patient was given the opportunity to have their questions answered. Oral consent to participate was provided by the patient.   This report was prepared as part of a clinical evaluation and is not intended for forensic use.  SERVICE   This evaluation was conducted by Renda Beckwith, Psy.D. In addition to time spent directly with the patient, total professional time (180 minutes) includes record review, integration of relevant medical history, test selection, interpretation of findings, and report preparation. A technician, Lonell Jude, B.S., provided testing and scoring assistance (10 minutes).  Psychiatric Diagnostic Evaluation Services (Professional): 09208 x 1 Neuropsychological Testing  Evaluation Services (Professional): 03867 x 1 Neuropsychological Testing Evaluation Services (Professional): 03866 x 2 Neuropsychological Test Administration and Scoring (Technician): [phone] x 1 Neuropsychological Test Administration and Scoring (Technician): [phone] x 3  This report was generated using voice recognition software. While this document has been carefully reviewed, transcription errors may be present. I apologize in advance for any inconvenience. Please contact me if further clarification is needed.            Renda Beckwith, Psy.D.             Neuropsychologist

## 2024-05-09 ENCOUNTER — Ambulatory Visit: Attending: Cardiovascular Disease | Admitting: Cardiovascular Disease

## 2024-05-09 ENCOUNTER — Encounter: Payer: Self-pay | Admitting: Cardiovascular Disease

## 2024-05-09 VITALS — BP 150/82 | HR 71 | Ht 65.0 in | Wt 143.6 lb

## 2024-05-09 DIAGNOSIS — E785 Hyperlipidemia, unspecified: Secondary | ICD-10-CM | POA: Diagnosis not present

## 2024-05-09 NOTE — Patient Instructions (Signed)
 Medication Instructions:  Your physician recommends that you continue on your current medications as directed. Please refer to the Current Medication list given to you today.  *If you need a refill on your cardiac medications before your next appointment, please call your pharmacy*  Lab Work: Lipids, LFTs If you have labs (blood work) drawn today and your tests are completely normal, you will receive your results only by: MyChart Message (if you have MyChart) OR A paper copy in the mail If you have any lab test that is abnormal or we need to change your treatment, we will call you to review the results.  Follow-Up: At [facility], you and your health needs are our priority.  As part of our continuing mission to provide you with exceptional heart care, our providers are all part of one team.  This team includes your primary Cardiologist (physician) and Advanced Practice Providers or APPs (Physician Assistants and Nurse Practitioners) who all work together to provide you with the care you need, when you need it.  Your next appointment:   6 month(s)  Provider:   Dorn Lesches, MD

## 2024-05-09 NOTE — Progress Notes (Signed)
 Ms. Cape returns today for follow-up of her outpatient diagnostic test and evaluation of atypical chest pain and palpitations.  She had a coronary calcium  score that was done which was 6.5 all in the LAD territory.  Her chest pain has resolved.  She still has palpitations mostly in the morning.  She had a 2D echo that was performed that was essentially normal and a 2-week Zio patch that showed occasional PACs, PVCs and short runs of SVT.  She does drink a couple coffee a day and we talked about going to decaf.  I did increase her atorvastatin  from 10 mg to 40 mg a day and we will recheck a lipid and liver profile today.  LDL goal less than 70.  I will see her back in 6 months.  Dorn DOROTHA Lesches, M.D., FACP, [facility], FAHA, [facility]  [address] [place], [place]  [address]  [phone] 05/09/2024 2:02 PM

## 2024-05-10 ENCOUNTER — Ambulatory Visit: Payer: Self-pay | Admitting: Cardiovascular Disease

## 2024-05-10 LAB — LIPID PANEL
Chol/HDL Ratio: 2.4 ratio (ref 0.0–4.4)
Cholesterol, Total: 200 mg/dL — ABNORMAL HIGH (ref 100–199)
HDL: 82 mg/dL (ref >39)
LDL Chol Calc (NIH): 100 mg/dL — ABNORMAL HIGH (ref 0–99)
Triglycerides: 103 mg/dL (ref 0–149)
VLDL Cholesterol Cal: 18 mg/dL (ref 5–40)

## 2024-05-10 LAB — HEPATIC FUNCTION PANEL
ALT: 19 IU/L (ref 0–32)
AST: 13 IU/L (ref 0–40)
Albumin: 4.6 g/dL (ref 3.9–4.9)
Alkaline Phosphatase: 82 IU/L (ref 49–135)
Bilirubin Total: 0.7 mg/dL (ref 0.0–1.2)
Bilirubin, Direct: 0.25 mg/dL (ref 0.00–0.40)
Total Protein: 6.8 g/dL (ref 6.0–8.5)

## 2024-05-10 MED ORDER — BUDESONIDE ER 9 MG PO TB24: 9.0000 mg | ORAL_TABLET | Freq: Every day | ORAL | 0 refills | Status: DC

## 2024-05-10 NOTE — Addendum Note (Signed)
 Addended by: CRAIG PALMA on: 05/10/2024 02:53 PM   Modules accepted: Orders

## 2024-05-11 ENCOUNTER — Other Ambulatory Visit: Payer: Self-pay

## 2024-05-11 ENCOUNTER — Ambulatory Visit (INDEPENDENT_AMBULATORY_CARE_PROVIDER_SITE_OTHER): Admitting: Psychology

## 2024-05-11 DIAGNOSIS — R4189 Other symptoms and signs involving cognitive functions and awareness: Secondary | ICD-10-CM

## 2024-05-11 DIAGNOSIS — E785 Hyperlipidemia, unspecified: Secondary | ICD-10-CM

## 2024-05-11 DIAGNOSIS — F418 Other specified anxiety disorders: Secondary | ICD-10-CM

## 2024-05-11 DIAGNOSIS — Z79899 Other long term (current) drug therapy: Secondary | ICD-10-CM

## 2024-05-11 MED ORDER — EZETIMIBE 10 MG PO TABS: 10.0000 mg | ORAL_TABLET | Freq: Every day | ORAL | 3 refills | Status: AC

## 2024-05-11 NOTE — Progress Notes (Signed)
   NEUROPSYCHOLOGY FEEDBACK SESSION [place]  [place] Department of Neurology  Date of Feedback Session: 05/11/2024  REASON FOR REFERRAL   Elishia Scalzo is a 68 year old, right-handed, White female with 14 years of formal education. She was referred for neuropsychological evaluation by Camie Sevin, PA-C, to assess current neurocognitive functioning, document potential cognitive deficits, and assist with treatment planning. This is her first neuropsychological evaluation.  FEEDBACK   Patient completed a comprehensive neuropsychological evaluation on 05/04/2024. Please refer to that encounter for the full report and recommendations. Briefly, results indicated relative weaknesses in learning/memory and novel problem-solving. Functional independence is maintained. At this time, a diagnosis of mild cognitive impairment is deferred to allow for further longitudinal monitoring. Patient is experiencing a high degree of stress and has known depression and anxiety. She is not sleeping as well. She is also caring for her mother. These factors can certainly exacerbate cognitive difficulties, especially attention. While objective testing did not reveal significant attention deficits, several concerns she described during the clinical interview appeared related to lapses in attention. It is also important to recognize that the structured and supportive nature of the testing environment may not fully capture the attention challenges she experiences in daily life. That said, her memory difficulties are concerning enough to warrant close and continued monitoring to rule out the possibility of an emerging neurodegenerative condition.   Today, the patient was unaccompanied. She was provided verbal feedback regarding the findings and impression during this visit, and her questions were answered. A copy of the report was provided at the conclusion of the visit.  DISPOSITION   Patient will follow  up with the referring provider, Ms. Wertman. She should return for repeat neuropsychological testing in 12-18 months to monitor her course and assist with diagnosis and treatment planning.  SERVICE   This feedback session was conducted by Renda Beckwith, Psy.D. [place] 03867 (35 minutes) was billed for Dr. Beckwith' time spent in preparing, conducting, and documenting the current feedback session.  This report was generated using voice recognition software. While this document has been carefully reviewed, transcription errors may be present. I apologize in advance for any inconvenience. Please contact me if further clarification is needed.

## 2024-05-24 ENCOUNTER — Encounter: Payer: Self-pay | Admitting: Physician Assistant

## 2024-05-24 ENCOUNTER — Ambulatory Visit (INDEPENDENT_AMBULATORY_CARE_PROVIDER_SITE_OTHER): Admitting: Physician Assistant

## 2024-05-24 VITALS — Resp 20

## 2024-05-24 DIAGNOSIS — F419 Anxiety disorder, unspecified: Secondary | ICD-10-CM

## 2024-05-24 DIAGNOSIS — R4189 Other symptoms and signs involving cognitive functions and awareness: Secondary | ICD-10-CM | POA: Insufficient documentation

## 2024-05-24 NOTE — Patient Instructions (Addendum)
 Referral to [facility] Repeat neurocognitive testing in September 2026  Follow up in about 1 year

## 2024-05-24 NOTE — Progress Notes (Signed)
 Assessment/Plan:    Cognitive changes  [name] is a very pleasant 68 y.o. RH female with a history of hypertension, hyperlipidemia, prior tobacco abuse, anxiety, depression  presenting today in follow-up to discuss the results of her neuropsych evaluation.  She has a current diagnosis of cognitive changes, not yet to warrant a diagnosis of mild cognitive impairment.  Patient is undergoing significant amount of stress, as well as depression which may be interfering with her performance.  She is not on antidementia medication at this time, will continue to monitor.  If there is any further cognitive decline, we will consider it.  She is able to participate all her ADLs and continues to drive without difficulties.  Discussed the role of psychotherapy and psychiatry in the treatment of anxiety and depression, which could potentially help her memory. The patient is here alone, and all the records as well as any outside records available were reviewed prior to today's visit.  She was last seen on 12/24/2023 with [facility] 26/30.   Recommendations:   Follow up in 1 year No antidementia medication is indicated at this time Repeat neuropsych evaluation in 12 to 18 months for diagnostic clarity and disease trajectory Recommend psychotherapy for the management of anxiety and depression, will place a referral Recommend good control of cardiovascular risk factors      Neuropsych evaluation 05/04/2024 briefly, results indicated relative weaknesses in learning/memory and novel problem-solving. Functional independence is maintained. At this time, a diagnosis of mild cognitive impairment is deferred to allow for further longitudinal monitoring. Patient is experiencing a high degree of stress and has known depression and anxiety. She is not sleeping as well. She is also caring for her mother. These factors can certainly exacerbate cognitive difficulties, especially attention. While objective testing did not  reveal significant attention deficits, several concerns she described during the clinical interview appeared related to lapses in attention. It is also important to recognize that the structured and supportive nature of the testing environment may not fully capture the attention challenges she experiences in daily life. That said, her memory difficulties are concerning enough to warrant close and continued monitoring to rule out the possibility of an emerging neurodegenerative condition.   MRI of the brain, personally reviewed 01/14/2024 without acute intracranial findings, mild chronic small vessel disease , mild generalized parenchymal atrophy.  Initial visit 12/24/2023 How long did patient have memory difficulties? For the last 5-6 years worse for  the last 2 years as she feels that she is losing her train of thought while speaking, and then cannot remember what I am talking about.  She may have some difficulty remembering new information, conversations and names except for those of her family members. 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  Long-term memory is good. Loves brain games, music.  repeats oneself?  Endorsed, not frequently Disoriented when walking into a room?  Patient denies except occasionally not remembering what patient came to the room for    Leaving objects in unusual places? Endorsed, infrequent, but she has left milk in the cabinet last year  Wandering behavior?  Denies. Any personality changes?  Denies. She feels different, feeling that she is losing interest in going places, or engaging in conversations   Any history of depression?:  Endorsed   Hallucinations or paranoia?  Denies   Seizures? Denies  Any sleep changes?   Sleeps well, for the last 6 months I don't know if  it was a dream but, my heart is slamming out of my body  She has RLS, not on meds. Denies sleepwalking   Sleep apnea? She does not know. Any  hygiene concerns?  She is less interested in grooming    Independent of bathing and dressing?  Endorsed  Does the patient needs help with medications? Patient is in charge   Who is in charge of the finances? Patient is in charge     Any changes in appetite?  Denies. Drinks plenty water      Patient have trouble swallowing? Denies.   Does the patient cook? Yes, following a recipe can be a challenge, forgetting ingredients.   Any kitchen accidents such as leaving the stove on? Denies.   Any history of headaches?   Premenopause, not now  Chronic pain ? Denies.   Ambulates with difficulty? Sometimes I walk and feel not walking great as before. She not active.  She does not exercise on a regular basis.   Recent falls or head injuries? Has fallen 2 years ago, need ing rotator cuff surgery.  No recent major falls  Vision changes? Needs new glasses, less acuity Any stroke like symptoms? Denies.   Any tremors?   Denies.   Any anosmia?  Denies.   Any incontinence of urine? Denies.   Any bowel dysfunction? 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 Denies.   History of heavy tobacco use? Quit x 6 weeks a 20 pack year   Family history of dementia? Denies. Mother is 58, may be senile, but no diagnosis of dementia. Does patient drive? Yes, denies getting lost    MRI of the brain, personally reviewed from 01/14/2024, personally reviewed, remarkable for mild chronic small vessel ischemia, mild generalized parenchymal atrophy, no acute intracranial finding  Past Medical History:  Diagnosis Date   Anxiety    Clostridioides difficile infection    Depression    High blood cholesterol    Migraine    Palpitations    Pancreatitis    Ulcerative colitis [facility])      Past Surgical History:  Procedure Laterality Date   APPENDECTOMY Right    BIOPSY  08/18/2019   Procedure: BIOPSY;  Surgeon: Charlanne Groom, MD;  Location: [facility];  Service:  Endoscopy;;   COLONOSCOPY N/A 08/18/2019   Procedure: COLONOSCOPY;  Surgeon: Charlanne Groom, MD;  Location: [facility];  Service: Endoscopy;  Laterality: N/A;   COLONOSCOPY  12/12/2021   per Dr. Charlanne, no polyps, repeat in 5 yrs   ESOPHAGOGASTRODUODENOSCOPY N/A 08/18/2019   per Dr. Charlanne, showed GERD, neg for H pylori   POLYPECTOMY  08/18/2019   Procedure: POLYPECTOMY;  Surgeon: Charlanne Groom, MD;  Location: [facility];  Service: Endoscopy;;   TONSILLECTOMY       PREVIOUS MEDICATIONS:   CURRENT MEDICATIONS:  Outpatient Encounter Medications as of 05/24/2024  Medication Sig   AMBULATORY NON FORMULARY MEDICATION Medication Name: Diltiazem 2%/Lidocaine  2%   Using your index finger apply a small amount of medication inside the anal opening and to the external anal area twice daily x 6 weeks. (Patient not taking: Reported on 05/09/2024)   atorvastatin  (LIPITOR) 40 MG tablet Take 1 tablet (40 mg total) by mouth daily.   Budesonide  ER (UCERIS ) 9 MG TB24 Take 1 tablet (9 mg total) by mouth daily.   buPROPion  (WELLBUTRIN  XL) 300 MG 24 hr tablet TAKE 1 TABLET(300 MG) BY MOUTH IN THE MORNING   dicyclomine  (  BENTYL ) 20 MG tablet Take 1 tablet (20 mg total) by mouth 3 (three) times daily as needed for spasms. (Patient not taking: Reported on 05/09/2024)   ezetimibe  (ZETIA ) 10 MG tablet Take 1 tablet (10 mg total) by mouth daily.   LORazepam  (ATIVAN ) 0.5 MG tablet Take 1 tablet (0.5 mg total) by mouth every 8 (eight) hours as needed for anxiety.   losartan  (COZAAR ) 50 MG tablet Take 1 tablet (50 mg total) by mouth daily.   mesalamine  (LIALDA ) 1.2 g EC tablet Take 2 tablets (2.4 g total) by mouth 2 (two) times daily.   Multiple Vitamin (MULTIVITAMIN) tablet Take 1 tablet by mouth daily.   ondansetron  (ZOFRAN -ODT) 4 MG disintegrating tablet Take 1 tablet (4 mg total) by mouth every 8 (eight) hours as needed for nausea or vomiting.   pantoprazole  (PROTONIX ) 40 MG tablet Take 1 tablet (40 mg total) by  mouth daily.   sertraline  (ZOLOFT ) 100 MG tablet TAKE 1 TABLET(100 MG) BY MOUTH DAILY   No facility-administered encounter medications on file as of 05/24/2024.     Objective:          12/24/2023    9:00 AM  Montreal Cognitive Assessment   Visuospatial/ Executive (0/5) 5  Naming (0/3) 3  Attention: Read list of digits (0/2) 2  Attention: Read list of letters (0/1) 1  Attention: Serial 7 subtraction starting at 100 (0/3) 2  Language: Repeat phrase (0/2) 2  Language : Fluency (0/1) 1  Abstraction (0/2) 1  Delayed Recall (0/5) 3  Orientation (0/6) 6  Total 26  Adjusted Score (based on education) 26        No data to display             Thank you for allowing us  the opportunity to participate in the care of this nice patient. Please do not hesitate to contact us  for any questions or concerns.   Total time spent on today's visit was 36 minutes dedicated to this patient today, preparing to see patient, examining the patient, ordering tests and/or medications and counseling the patient, documenting clinical information in the EHR or other health record, independently interpreting results and communicating results to the patient/family, discussing treatment and goals, answering patient's questions and coordinating care.  Cc:  Johnny Garnette LABOR, MD  Camie Sevin 05/24/2024 6:36 AM

## 2024-06-08 HISTORY — PX: CATARACT EXTRACTION, BILATERAL: SHX1313

## 2024-06-15 ENCOUNTER — Telehealth: Payer: Self-pay | Admitting: Physician Assistant

## 2024-06-15 NOTE — Telephone Encounter (Signed)
 Inbound call from patient stating that she is going to go to [place]  and she was hoping that we could prescribe her something for her fever blister due to them being caused by her GI symptoms. Patient is requesting a call back. Please advise.

## 2024-06-15 NOTE — Telephone Encounter (Signed)
 MyChart message to patient with recommendation to follow up with PCP.

## 2024-06-23 ENCOUNTER — Ambulatory Visit (INDEPENDENT_AMBULATORY_CARE_PROVIDER_SITE_OTHER): Admitting: Physician Assistant

## 2024-06-23 ENCOUNTER — Encounter: Payer: Self-pay | Admitting: Physician Assistant

## 2024-06-23 ENCOUNTER — Other Ambulatory Visit

## 2024-06-23 VITALS — BP 132/80 | HR 66 | Ht 65.0 in | Wt 147.0 lb

## 2024-06-23 DIAGNOSIS — K5190 Ulcerative colitis, unspecified, without complications: Secondary | ICD-10-CM | POA: Diagnosis not present

## 2024-06-23 DIAGNOSIS — R140 Abdominal distension (gaseous): Secondary | ICD-10-CM

## 2024-06-23 DIAGNOSIS — R197 Diarrhea, unspecified: Secondary | ICD-10-CM

## 2024-06-23 DIAGNOSIS — K51019 Ulcerative (chronic) pancolitis with unspecified complications: Secondary | ICD-10-CM | POA: Diagnosis not present

## 2024-06-23 DIAGNOSIS — R109 Unspecified abdominal pain: Secondary | ICD-10-CM

## 2024-06-23 DIAGNOSIS — Z79899 Other long term (current) drug therapy: Secondary | ICD-10-CM

## 2024-06-23 DIAGNOSIS — K219 Gastro-esophageal reflux disease without esophagitis: Secondary | ICD-10-CM

## 2024-06-23 DIAGNOSIS — E785 Hyperlipidemia, unspecified: Secondary | ICD-10-CM

## 2024-06-23 LAB — CBC WITH DIFFERENTIAL/PLATELET
Basophils Absolute: 0 K/uL (ref 0.0–0.1)
Basophils Relative: 0.8 % (ref 0.0–3.0)
Eosinophils Absolute: 0.1 K/uL (ref 0.0–0.7)
Eosinophils Relative: 1.2 % (ref 0.0–5.0)
HCT: 34.9 % — ABNORMAL LOW (ref 36.0–46.0)
Hemoglobin: 12 g/dL (ref 12.0–15.0)
Lymphocytes Relative: 30.2 % (ref 12.0–46.0)
Lymphs Abs: 1.4 K/uL (ref 0.7–4.0)
MCHC: 34.3 g/dL (ref 30.0–36.0)
MCV: 88.2 fl (ref 78.0–100.0)
Monocytes Absolute: 0.5 K/uL (ref 0.1–1.0)
Monocytes Relative: 11.1 % (ref 3.0–12.0)
Neutro Abs: 2.7 K/uL (ref 1.4–7.7)
Neutrophils Relative %: 56.7 % (ref 43.0–77.0)
Platelets: 200 K/uL (ref 150.0–400.0)
RBC: 3.95 Mil/uL (ref 3.87–5.11)
RDW: 14.5 % (ref 11.5–15.5)
WBC: 4.7 K/uL (ref 4.0–10.5)

## 2024-06-23 LAB — HEPATIC FUNCTION PANEL
ALT: 26 U/L (ref 0–35)
AST: 19 U/L (ref 0–37)
Albumin: 4.4 g/dL (ref 3.5–5.2)
Alkaline Phosphatase: 72 U/L (ref 39–117)
Bilirubin, Direct: 0.3 mg/dL (ref 0.0–0.3)
Total Bilirubin: 1.2 mg/dL (ref 0.2–1.2)
Total Protein: 7 g/dL (ref 6.0–8.3)

## 2024-06-23 LAB — HIGH SENSITIVITY CRP: CRP, High Sensitivity: 3.99 mg/L (ref 0.000–5.000)

## 2024-06-23 LAB — BASIC METABOLIC PANEL WITH GFR
BUN: 17 mg/dL (ref 6–23)
CO2: 26 meq/L (ref 19–32)
Calcium: 9.1 mg/dL (ref 8.4–10.5)
Chloride: 106 meq/L (ref 96–112)
Creatinine, Ser: 0.88 mg/dL (ref 0.40–1.20)
GFR: 67.8 mL/min (ref >60.00)
Glucose, Bld: 92 mg/dL (ref 70–99)
Potassium: 4.1 meq/L (ref 3.5–5.1)
Sodium: 140 meq/L (ref 135–145)

## 2024-06-23 LAB — SEDIMENTATION RATE: Sed Rate: 27 mm/h (ref 0–30)

## 2024-06-23 MED ORDER — SUCRALFATE 1 G PO TABS: 1.0000 g | ORAL_TABLET | Freq: Three times a day (TID) | ORAL | 0 refills | Status: DC

## 2024-06-23 MED ORDER — PREDNISONE 10 MG PO TABS: ORAL_TABLET | ORAL | 0 refills | Status: AC

## 2024-06-23 MED ORDER — PANTOPRAZOLE SODIUM 40 MG PO TBEC: 40.0000 mg | DELAYED_RELEASE_TABLET | Freq: Two times a day (BID) | ORAL | 0 refills | Status: DC

## 2024-06-23 NOTE — Progress Notes (Signed)
 06/23/2024 [name] [id number] June 09, 1956  Referring provider: Johnny Garnette LABOR, MD Primary GI doctor: Dr. Charlanne  ASSESSMENT AND PLAN:   Ulcerative Colitis 11/2021 normal suggestive of complete mucosal healing mild sigmoid diverticulosis recall 3 years UC flare 12/2023, increased mesalamine  to 2.4 mg BID and had 1 month prednisone  taper, resolved but returned trial of budesonide  for 2 months not helping 12/2023 negative diatherix Fecal cal protectin 3000--> 21  03/2024 KUB with large volume stool BM 4-5 x a day, chills/sweating at night no fever, gold color stools, nausea, gas Very suspicious for IBD flare, rule out infection, likely has failed 5ASA/budesonide  - will get CT AB and pelvis with contrast - Recheck fecal calprotectin and sed rate, CBC, CMET - diathereix here in the office - start on prednisone  taper - Provide FODMAP diet guidance. - Consider restaging colonoscopy pending results, but she prefers to wait until it is due - Discussed potential biologic therapy options based on insurance coverage and preference for infusions including entyvio or remicaide if she continues to have evidence of UC flare despite 4.8 gram versus  -She is up to date on vaccinations, TB screening and Hep B screening - stop marijuana use  GERD 2023 EGD - Small transient hiatal hernia. - Gastritis. Biopsied. - Normal examined duodenum. Biopsied. Negative H pylori, celiac -Stop ETOH -Increase pantoprazole  40 mg BID -Add on carfate 1 gram QID for 10 dya -Consider AB US /HIDA/repeat EGD  Gallbladder dysfunction Possible gallbladder dysfunction due to golden stools, nausea, and abdominal pain. - Consider HIDA scan if no inflammation is found.  Rectal Fissure Posterior rectal fissure likely related to ulcerative colitis or straining No fissure on exam  Diverticulosis Diverticulosis noted on CT scan in Jan. No current symptoms or evidence of diverticulitis.  Pelvic Congestion  Syndrome Enlarged pelvic blood vessels noted on CT scan. No current intervention planned unless symptoms worsen.  Patient Care Team: Johnny Garnette LABOR, MD as PCP - General (Family Medicine) Wertman, Sara E, PA-C (Neurology) Court Dorn PARAS, MD as Consulting Physician (Cardiology)  HISTORY OF PRESENT ILLNESS: 68 y.o. female presents for evaluation of UC pancolitis. I last saw the patient in the office 03/31/2024  IBD history: Diagnosed 07/2019 after perfuse rectal bleeding Has been mesalamine  since that time. Last exacerbation 07/2021 with prednisone  maintained on mesalamine  12/29/2023 patient had office visit for diarrhea and rectal bleeding nausea gas had fecal calprotectin 3000 negative Diatherix, mesalamine  increased to 2.4 mg twice daily and had prednisone  taper of a month 03/31/2024 symptoms resolved with prednisone  taper however returned 2 to 3 weeks after, given budesonide  for 2 months, fecal calprotectin 21 (6000), sed rate negative KUB stool throughout the majority of the colon no obstruction  Last colonoscopy: 11/2021 normal suggestive of complete mucosal healing mild sigmoid diverticulosis recall 3 years 07/2019 colonoscopy moderately active pancolitis normal TI  Last small bowel imaging:   09/11/2023 CT and pelvis with contrast no acute abnormality scattered left-sided diverticulosis no diverticulitis moderate formed stool in the colon prominence of early filling of bilateral gonadal veins and pelvic lateral vessels 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  Other significant medical history: GERD on pantoprazole  40 mg daily Quit smoking in March 2025, negative Sjogren's  Current History Discussed the use of AI scribe software for clinical note transcription with the patient, who gave verbal consent to proceed.  History of Present Illness   [name] is a 68 year old female with  ulcerative colitis  who presents with persistent gastrointestinal symptoms.  She has a history of ulcerative colitis and initially presented in May with diarrhea and rectal bleeding. Her mesalamine  dosage was increased to 2.4 mg twice daily, and she was given a prednisone  taper, which initially improved her symptoms. However, symptoms returned two to three weeks after completing the prednisone  taper.  In August, she was prescribed a two-month course of budesonide , which did not alleviate her symptoms. A fecal calprotectin test showed improvement from 6000 to 21. An x-ray at that time showed stool throughout the colon.  Currently, she feels 'sick all the time' with urgency and soft stools, primarily in the morning, occurring four to five times a day. She notes a golden color to her stools but no blood. She experiences night sweats with chills but no fever, and significant nausea and gas without vomiting. She describes abdominal pain as diffuse.  She has gained weight, noting her clothes are tighter, and reports a weight increase from 132 lbs in December to 143 lbs currently. No recent antibiotic use and no family history of gallbladder issues. She continues to take mesalamine  twice daily and pantoprazole  once daily.  Her social history includes occasional alcohol and marijuana use, primarily CBD, and she quit smoking cigarettes in March. She denies regular use of NSAIDs.      Recent labs: 12/29/2023 TB GOLD NEGATIVE 12/29/2023  HepBsAG NON-REACTIVE      Latest Ref Rng & Units 04/01/2024   11:23 03/31/2024   08:56 12/30/2023   10:20 12/29/2023   14:55 09/04/2023   14:53  Inflammatory Markers  Calprotectin mcg/g 21                                             Reference Range:                                       <50     Normal                                       50-120  Borderline                                       >120    Elevated . Calprotectin in Crohn's disease and ulcerative colitis can be five to several  thousand times above the reference population (50 mcg/g or less). Levels are usually 50 mcg/g or less in healthy patients and with irritable bowel syndrome. Repeat testing in 4-6 weeks is suggested for borderline values.   6,040                                             Reference Range:                                       <50     Normal  50-120  Borderline                                       >120    Elevated . Calprotectin in Crohn's disease and ulcerative colitis can be five to several thousand times above the reference population (50 mcg/g or less). Levels are usually 50 mcg/g or less in healthy patients and with irritable bowel syndrome. Repeat testing in 4-6 weeks is suggested for borderline values.     Sed Rate 0 - 30 mm/hr  21   18    CRP 0.5 - 20.0 mg/dL     <8.9    08/26/7972 WBC 6.4 HGB 11.8 MCV 88.6  04/01/2024 Iron 78 Ferritin 81.0 B12 380 03/31/2024  HGB 11.8 MCV 88.6 Platelets 219.0 04/01/2024 Iron 78 Ferritin 81.0 B12 380 Recent Labs    09/04/23 1453 10/27/23 1209 12/29/23 1455 02/08/24 1131 03/31/24 0856  HGB 13.2 13.1 12.1 12.7 11.8*    IBD Health Care Maintenance: Annual Flu Vaccine - yearly  Pneumococcal Vaccine if receiving immunosuppression: -  UTD TB testing if on anti-TNF, yearly - done Vitamin D  screening - on vitamin D  COVID vaccine - 2021 Shingrix  - 2021  Immunization History  Administered Date(s) Administered   Influenza,inj,Quad PF,6+ Mos 10/13/2018   Influenza-Unspecified 06/08/2019   PFIZER(Purple Top)SARS-COV-2 Vaccination 10/24/2019, 03/25/2020   PNEUMOCOCCAL CONJUGATE-20 02/06/2023   Tdap 09/01/2018   Zoster Recombinant(Shingrix ) 10/13/2018, 01/27/2020    RELEVANT GI HISTORY, LABS, IMAGING:  CBC    Component Value Date/Time   WBC 6.4 03/31/2024 0856   RBC 3.95 03/31/2024 0856   HGB 11.8 (L) 03/31/2024 0856   HGB 12.8 11/18/2021 1317   HCT 35.0 (L) 03/31/2024 0856   HCT 37.3 11/18/2021  1317   PLT 219.0 03/31/2024 0856   PLT 264 11/18/2021 1317   MCV 88.6 03/31/2024 0856   MCV 89 11/18/2021 1317   MCH 29.5 02/08/2024 1131   MCHC 33.8 03/31/2024 0856   RDW 13.0 03/31/2024 0856   RDW 12.0 11/18/2021 1317   LYMPHSABS 1.4 03/31/2024 0856   LYMPHSABS 2.0 11/18/2021 1317   MONOABS 0.6 03/31/2024 0856   EOSABS 0.1 03/31/2024 0856   EOSABS 0.0 11/18/2021 1317   BASOSABS 0.1 03/31/2024 0856   BASOSABS 0.1 11/18/2021 1317   Recent Labs    09/04/23 1453 10/27/23 1209 12/29/23 1455 02/08/24 1131 03/31/24 0856  HGB 13.2 13.1 12.1 12.7 11.8*    CMP     Component Value Date/Time   NA 138 03/31/2024 0856   NA 145 (H) 11/18/2021 1317   K 4.7 03/31/2024 0856   CL 104 03/31/2024 0856   CO2 22 03/31/2024 0856   GLUCOSE 95 03/31/2024 0856   BUN 19 03/31/2024 0856   BUN 13 11/18/2021 1317   CREATININE 0.89 03/31/2024 0856   CREATININE 0.94 02/08/2024 1131   CALCIUM  8.9 03/31/2024 0856   PROT 6.8 05/09/2024 1410   ALBUMIN 4.6 05/09/2024 1410   AST 13 05/09/2024 1410   ALT 19 05/09/2024 1410   ALKPHOS 82 05/09/2024 1410   BILITOT 0.7 05/09/2024 1410   GFRNONAA >60 10/27/2023 1209   GFRAA >60 08/17/2019 0556      Latest Ref Rng & Units 05/09/2024    2:10 PM 03/31/2024    8:56 AM 02/03/2024   11:44 AM  Hepatic Function  Total Protein 6.0 - 8.5 g/dL 6.8  6.6  6.7   Albumin 3.9 -  4.9 g/dL 4.6  4.2  4.5   AST 0 - 40 IU/L 13  16  18    ALT 0 - 32 IU/L 19  16  20    Alk Phosphatase 49 - 135 IU/L 82  64  83   Total Bilirubin 0.0 - 1.2 mg/dL 0.7  0.4  0.7   Bilirubin, Direct 0.00 - 0.40 mg/dL 9.74  0.0  9.75       Current Medications:   Current Outpatient Medications (Endocrine & Metabolic):    predniSONE  (DELTASONE ) 10 MG tablet, Take 4 tablets (40 mg total) by mouth daily with breakfast for 7 days, THEN 3 tablets (30 mg total) daily with breakfast for 7 days, THEN 2 tablets (20 mg total) daily with breakfast for 7 days, THEN 1 tablet (10 mg total) daily with  breakfast for 7 days, THEN 0.5 tablets (5 mg total) daily with breakfast for 8 days.  Current Outpatient Medications (Cardiovascular):    atorvastatin  (LIPITOR) 40 MG tablet, Take 1 tablet (40 mg total) by mouth daily.   ezetimibe  (ZETIA ) 10 MG tablet, Take 1 tablet (10 mg total) by mouth daily.   losartan  (COZAAR ) 50 MG tablet, Take 1 tablet (50 mg total) by mouth daily.     Current Outpatient Medications (Other):    AMBULATORY NON FORMULARY MEDICATION, Medication Name: Diltiazem 2%/Lidocaine  2%   Using your index finger apply a small amount of medication inside the anal opening and to the external anal area twice daily x 6 weeks.   buPROPion  (WELLBUTRIN  XL) 300 MG 24 hr tablet, TAKE 1 TABLET(300 MG) BY MOUTH IN THE MORNING   dicyclomine  (BENTYL ) 20 MG tablet, Take 1 tablet (20 mg total) by mouth 3 (three) times daily as needed for spasms.   LORazepam  (ATIVAN ) 0.5 MG tablet, Take 1 tablet (0.5 mg total) by mouth every 8 (eight) hours as needed for anxiety.   mesalamine  (LIALDA ) 1.2 g EC tablet, Take 2 tablets (2.4 g total) by mouth 2 (two) times daily.   Multiple Vitamin (MULTIVITAMIN) tablet, Take 1 tablet by mouth daily.   ondansetron  (ZOFRAN -ODT) 4 MG disintegrating tablet, Take 1 tablet (4 mg total) by mouth every 8 (eight) hours as needed for nausea or vomiting.   pantoprazole  (PROTONIX ) 40 MG tablet, Take 1 tablet (40 mg total) by mouth 2 (two) times daily before a meal.   sertraline  (ZOLOFT ) 100 MG tablet, TAKE 1 TABLET(100 MG) BY MOUTH DAILY   sucralfate (CARAFATE) 1 g tablet, Take 1 tablet (1 g total) by mouth 4 (four) times daily -  with meals and at bedtime for 10 days.  Medical History:  Past Medical History:  Diagnosis Date   Anxiety    Clostridioides difficile infection    Depression    High blood cholesterol    Migraine    Palpitations    Pancreatitis    Ulcerative colitis (HCC)    Allergies: No Known Allergies   Surgical History:  She  has a past surgical history  that includes Tonsillectomy; Appendectomy (Right); Esophagogastroduodenoscopy (N/A, 08/18/2019); Colonoscopy (N/A, 08/18/2019); biopsy (08/18/2019); polypectomy (08/18/2019); and Colonoscopy (12/12/2021). Family History:  Her family history includes Hypertension in her mother; Liver disease in her brother and brother; Prostate cancer in her father.  REVIEW OF SYSTEMS  : All other systems reviewed and negative except where noted in the History of Present Illness.  PHYSICAL EXAM: BP 132/80   Pulse 66   Ht 5' 5 (1.651 m)   Wt 147 lb (66.7 kg)  BMI 24.46 kg/m  Physical Exam   MEASUREMENTS: Weight- 143. GENERAL APPEARANCE: Well nourished, in no apparent distress. HEENT: No cervical lymphadenopathy, unremarkable thyroid, sclerae anicteric, conjunctiva pink. RESPIRATORY: Respiratory effort normal, breath sounds equal bilaterally without rales, rhonchi, or wheezing. CARDIO: Regular rate and rhythm with no murmurs, rubs, or gallops, peripheral pulses intact. ABDOMEN: Soft, non-distended, decreased bowel sounds, epigastric tenderness, no rebound, no mass appreciated. RECTAL: Soft stool, no masses, positive for blood, hemorrhoids present, no abscesses. MUSCULOSKELETAL: Full range of motion, normal gait, without edema. SKIN: Dry, intact without rashes or lesions. No jaundice. NEURO: Alert, oriented, no focal deficits. PSYCH: Cooperative, normal mood and affect.        Alan JONELLE Coombs, PA-C 3:50 PM

## 2024-06-23 NOTE — Patient Instructions (Addendum)
 Your provider has requested that you go to the basement level for lab work before leaving today. Press B on the elevator. The lab is located at the first door on the left as you exit the elevator.  Increase pantoprazole  40 mg to TWICE a day before food  Sending a medication called Carafate, this mechanically coats your stomach.  Can cause constipation and darker stools.  Take about 30 mins to 1 hour before food and before bed.   If the pill is too large to take you can make it more like a liquid by doing this, can dissolve it in warm water,  in small orange juice glass or shot glass and take it more as a liquid.   Call [phone] if you need to reschedule your CT scan, You are scheduled at [facility] on 07/06/2024 arrival at 9:15 am.. You will Drink oral contrast at 9:30 and at 10:30 am and your scan will be at 11:30 am.  Due to recent changes in healthcare laws, you may see the results of your imaging and laboratory studies on MyChart before your provider has had a chance to review them.  We understand that in some cases there may be results that are confusing or concerning to you. Not all laboratory results come back in the same time frame and the provider may be waiting for multiple results in order to interpret others.  Please give us  48 hours in order for your provider to thoroughly review all the results before contacting the office for clarification of your results.   _______________________________________________________  If your blood pressure at your visit was 140/90 or greater, please contact your primary care physician to follow up on this.  _______________________________________________________  If you are age 13 or older, your body mass index should be between 23-30. Your Body mass index is 24.46 kg/m. If this is out of the aforementioned range listed, please consider follow up with your Primary Care Provider.  If you are age 87 or younger, your body mass  index should be between 19-25. Your Body mass index is 24.46 kg/m. If this is out of the aformentioned range listed, please consider follow up with your Primary Care Provider.   ________________________________________________________  The [facility] providers would like to encourage you to use MYCHART to communicate with providers for non-urgent requests or questions.  Due to long hold times on the telephone, sending your provider a message by [facility] may be a faster and more efficient way to get a response.  Please allow 48 business hours for a response.  Please remember that this is for non-urgent requests.  _______________________________________________________  [facility] is using a team-based approach to care.  Your team is made up of your doctor and two to three APPS. Our APPS (Nurse Practitioners and Physician Assistants) work with your physician to ensure care continuity for you. They are fully qualified to address your health concerns and develop a treatment plan. They communicate directly with your gastroenterologist to care for you. Seeing the Advanced Practice Practitioners on your physician's team can help you by facilitating care more promptly, often allowing for earlier appointments, access to diagnostic testing, procedures, and other specialty referrals.    I appreciate the  opportunity to care for you  Thank You   [facility]

## 2024-06-24 ENCOUNTER — Ambulatory Visit: Payer: Self-pay | Admitting: Cardiovascular Disease

## 2024-06-24 ENCOUNTER — Other Ambulatory Visit

## 2024-06-24 ENCOUNTER — Ambulatory Visit: Payer: Self-pay | Admitting: Physician Assistant

## 2024-06-24 LAB — LIPID PANEL
Chol/HDL Ratio: 1.9 ratio (ref 0.0–4.4)
Cholesterol, Total: 151 mg/dL (ref 100–199)
HDL: 79 mg/dL (ref >39)
LDL Chol Calc (NIH): 60 mg/dL (ref 0–99)
Triglycerides: 58 mg/dL (ref 0–149)
VLDL Cholesterol Cal: 12 mg/dL (ref 5–40)

## 2024-06-24 LAB — SPECIMEN STATUS REPORT

## 2024-06-28 ENCOUNTER — Telehealth: Payer: Self-pay | Admitting: Physician Assistant

## 2024-06-28 NOTE — Telephone Encounter (Signed)
 [facility] GI stool pathogen negative

## 2024-07-01 LAB — CALPROTECTIN: Calprotectin: 433 ug/g — ABNORMAL HIGH

## 2024-07-03 ENCOUNTER — Other Ambulatory Visit: Payer: Self-pay | Admitting: Physician Assistant

## 2024-07-06 ENCOUNTER — Ambulatory Visit (HOSPITAL_COMMUNITY)

## 2024-07-06 ENCOUNTER — Ambulatory Visit (HOSPITAL_COMMUNITY)
Admission: RE | Admit: 2024-07-06 | Discharge: 2024-07-06 | Disposition: A | Discharge status: Home / Self Care | Source: Ambulatory Visit | Attending: Physician Assistant | Admitting: Physician Assistant

## 2024-07-06 DIAGNOSIS — R197 Diarrhea, unspecified: Secondary | ICD-10-CM | POA: Insufficient documentation

## 2024-07-06 DIAGNOSIS — K219 Gastro-esophageal reflux disease without esophagitis: Secondary | ICD-10-CM | POA: Insufficient documentation

## 2024-07-06 DIAGNOSIS — K51019 Ulcerative (chronic) pancolitis with unspecified complications: Secondary | ICD-10-CM | POA: Insufficient documentation

## 2024-07-06 DIAGNOSIS — R140 Abdominal distension (gaseous): Secondary | ICD-10-CM | POA: Insufficient documentation

## 2024-07-06 MED ORDER — IOHEXOL 9 MG/ML PO SOLN
1000.0000 mL | ORAL | Status: AC
  Administered 2024-07-06: 1000 mL via ORAL

## 2024-07-06 MED ORDER — IOHEXOL 300 MG/ML  SOLN
100.0000 mL | Freq: Once | INTRAMUSCULAR | Status: AC | PRN
  Administered 2024-07-06: 100 mL via INTRAVENOUS

## 2024-07-12 ENCOUNTER — Encounter: Payer: Self-pay | Admitting: Physician Assistant

## 2024-07-26 ENCOUNTER — Other Ambulatory Visit (HOSPITAL_COMMUNITY): Payer: Self-pay

## 2024-07-26 ENCOUNTER — Other Ambulatory Visit: Payer: Self-pay | Admitting: Family Medicine

## 2024-07-26 ENCOUNTER — Other Ambulatory Visit: Payer: Self-pay | Admitting: Gastroenterology

## 2024-07-26 ENCOUNTER — Other Ambulatory Visit: Payer: Self-pay | Admitting: Physician Assistant

## 2024-08-01 ENCOUNTER — Other Ambulatory Visit: Payer: Self-pay | Admitting: Family Medicine

## 2024-08-01 ENCOUNTER — Telehealth: Payer: Self-pay

## 2024-08-01 NOTE — Telephone Encounter (Signed)
 Called and spoke with patient. Patient has been scheduled for telephone PV on Monday, 08/22/24 at 10:30 am. Patient is aware that prep and instructions will be sent that day. [facility] colonoscopy scheduled with Dr. Charlanne on 08/31/24 arriving at 2:30 pm with a care partner. Patient is aware that her appt information is in MyChart. Patient verbalized understanding and had no concerns at the end of the call.

## 2024-08-02 ENCOUNTER — Other Ambulatory Visit: Payer: Self-pay | Admitting: Physician Assistant

## 2024-08-03 ENCOUNTER — Ambulatory Visit: Payer: Self-pay

## 2024-08-03 NOTE — Telephone Encounter (Signed)
 Patient has an appointment scheduled 08/04/24

## 2024-08-03 NOTE — Telephone Encounter (Signed)
 FYI Only or Action Required?: FYI only for provider: appointment scheduled on 08/04/24.  Patient was last seen in primary care on 02/08/2024 by [name] LABOR, MD.  Called Nurse Triage reporting Sore Throat and Hoarse.  Symptoms began several days ago.  Interventions attempted: Nothing.  Symptoms are: unchanged.  Triage Disposition: See Physician Within 24 Hours  Patient/caregiver understands and will follow disposition?: Yes   Copied from CRM #[id number]. Topic: Clinical - Red Word Triage >> Aug 03, 2024  2:48 PM Thersia BROCKS wrote: [facility] that prompted transfer to Nurse Triage: Patient called in stated she is very sick, sore throat , losing voice persistent cough , mucus would like to be seen by a doctor today    ----------------------------------------------------------------------- From previous Reason for Contact - Scheduling: Patient/patient representative is calling to schedule an appointment. Refer to attachments for appointment information. Reason for Disposition  SEVERE throat pain (e.g., excruciating)  Answer Assessment - Initial Assessment Questions No available appts today. Scheduled alt provider 08/04/24.  Advised call back or UC/ED if symptoms worsen. Pt verbalized understanding.  1. ONSET: When did the throat start hurting? (Hours or days ago)      Monday 2. SEVERITY: How bad is the sore throat? (Scale 1-10; mild, moderate or severe)     8/10 3. STREP EXPOSURE: Has there been any exposure to strep within the past week? If Yes, ask: What type of contact occurred?      no 4.  VIRAL SYMPTOMS: Are there any symptoms of a cold, such as a runny nose, cough, hoarse voice or red eyes?      Cough;yellowish, hoarse voice, runny nose 5. FEVER: Do you have a fever? If Yes, ask: What is your temperature, how was it measured, and when did it start?     Last temp 101, 1 hour ago; not medicated Chills,nausea Denies v/d 6. PUS ON THE TONSILS: Is there pus on the  tonsils in the back of your throat?     Unable see 7. OTHER SYMPTOMS: Do you have any other symptoms? (e.g., difficulty breathing, headache, rash)     Denies diff breathing, rash, dizziness/ faint, drooling, diff swallowing Reports HA, fever, cough  Protocols used: Sore Throat-A-AH

## 2024-08-04 ENCOUNTER — Ambulatory Visit: Admitting: Family Medicine

## 2024-08-04 ENCOUNTER — Encounter: Payer: Self-pay | Admitting: Family Medicine

## 2024-08-04 VITALS — BP 120/72 | HR 72 | Temp 98.0°F | Resp 16 | Ht 65.0 in | Wt 142.0 lb

## 2024-08-04 DIAGNOSIS — J069 Acute upper respiratory infection, unspecified: Secondary | ICD-10-CM

## 2024-08-04 DIAGNOSIS — R051 Acute cough: Secondary | ICD-10-CM

## 2024-08-04 DIAGNOSIS — R509 Fever, unspecified: Secondary | ICD-10-CM

## 2024-08-04 DIAGNOSIS — J029 Acute pharyngitis, unspecified: Secondary | ICD-10-CM

## 2024-08-04 LAB — POCT RAPID STREP A (OFFICE): Rapid Strep A Screen: NEGATIVE

## 2024-08-04 LAB — POCT INFLUENZA A/B
Influenza A, POC: NEGATIVE
Influenza B, POC: NEGATIVE

## 2024-08-04 LAB — POC COVID19 BINAXNOW: SARS Coronavirus 2 Ag: NEGATIVE

## 2024-08-04 MED ORDER — GUAIFENESIN-CODEINE 100-10 MG/5ML PO SOLN: 5.0000 mL | Freq: Every evening | ORAL | 0 refills | Status: DC | PRN

## 2024-08-04 NOTE — Progress Notes (Signed)
 Subjective:  Patient ID: [name], female    DOB: [birth year]  Age: 68 y.o. MRN: [id number]  CC:  Chief Complaint  Patient presents with   Sore Throat    Congestion. HA. Fever. Coughing. Vomiting. Sx started Monday morning.     HPI [name] presents for   Acute visit for above. PCP is Johnny Garnette LABOR, MD  Sore throat, fever, congestion Initial symptoms started 3-4 days ago. Sore throat initially, then body aches, congestion. Some nausea. Some cough, nausea with vomiting 1st night. No recurrence.  Drinking fluids. Fever last night 101.   No dyspnea.  Sough and runny nose are worst sx.  Sick contact - grandson, son, dtr  in law, viral illness, bronchitis.  around people at party last w/e.  No home testing.  Felt worse this am - more cough, sore throat. Trouble with voice last night.  Night sweat last night. Feeling a little better since this morning. Yellow mucus. Not feeling any chest sx's.  Tx: motrin, nyquil - working ok.  Nonsmoker.   Discussed risk of BDZ with codeine cough syrup and not to combine.  Hx of ulcerative colitis, antibiotics have caused flares in past.   History Patient Active Problem List   Diagnosis Date Noted   Cognitive changes 05/24/2024   Vertigo 02/08/2024   Atypical chest pain 02/03/2024   Memory loss 12/02/2023   Dyslipidemia 11/09/2023   Aortic atherosclerosis 11/09/2023   Primary hypertension 11/09/2023   GERD (gastroesophageal reflux disease) 12/22/2022   Pancolitis 09/08/2019   Elevated lipase    Rectal bleeding    Acute pancreatitis 08/11/2019   Hypokalemia 08/11/2019   Anxiety with depression 08/11/2019   Skin cancer 10/13/2018   Tobacco abuse 04/14/2015   Insomnia 10/10/2011   Colitis 02/17/2011   PALPITATIONS, HX OF 10/29/2009   Past Medical History:  Diagnosis Date   Anxiety    Clostridioides difficile infection    Depression    High blood cholesterol    Migraine    Palpitations    Pancreatitis    Ulcerative  colitis [facility])    Past Surgical History:  Procedure Laterality Date   APPENDECTOMY Right    BIOPSY  08/18/2019   Procedure: BIOPSY;  Surgeon: Charlanne Groom, MD;  Location: [facility];  Service: Endoscopy;;   COLONOSCOPY N/A 08/18/2019   Procedure: COLONOSCOPY;  Surgeon: Charlanne Groom, MD;  Location: [facility];  Service: Endoscopy;  Laterality: N/A;   COLONOSCOPY  12/12/2021   per Dr. Charlanne, no polyps, repeat in 5 yrs   ESOPHAGOGASTRODUODENOSCOPY N/A 08/18/2019   per Dr. Charlanne, showed GERD, neg for H pylori   POLYPECTOMY  08/18/2019   Procedure: POLYPECTOMY;  Surgeon: Charlanne Groom, MD;  Location: [facility];  Service: Endoscopy;;   TONSILLECTOMY     Allergies[1] Prior to Admission medications  Medication Sig Start Date End Date Taking? Authorizing Provider  atorvastatin  (LIPITOR) 40 MG tablet Take 1 tablet (40 mg total) by mouth daily. 02/29/24  Yes Court Dorn PARAS, MD  buPROPion  (WELLBUTRIN  XL) 300 MG 24 hr tablet TAKE 1 TABLET(300 MG) BY MOUTH IN THE MORNING 04/15/24  Yes Johnny Garnette LABOR, MD  ezetimibe  (ZETIA ) 10 MG tablet Take 1 tablet (10 mg total) by mouth daily. 05/11/24 08/09/24 Yes Court Dorn PARAS, MD  LORazepam  (ATIVAN ) 0.5 MG tablet Take 1 tablet (0.5 mg total) by mouth every 8 (eight) hours as needed for anxiety. 02/12/24  Yes Johnny Garnette LABOR, MD  losartan  (COZAAR ) 50 MG tablet Take 1  tablet (50 mg total) by mouth daily. 12/02/23  Yes Johnny Garnette LABOR, MD  mesalamine  (LIALDA ) 1.2 g EC tablet TAKE 2 TABLETS(2.4 GRAMS) BY MOUTH TWICE DAILY 07/04/24  Yes Collier, Amanda R, PA-C  Multiple Vitamin (MULTIVITAMIN) tablet Take 1 tablet by mouth daily.   Yes [provider]  pantoprazole  (PROTONIX ) 40 MG tablet Take 1 tablet (40 mg total) by mouth 2 (two) times daily before a meal. 06/23/24 09/21/24 Yes Craig Palma R, PA-C  sertraline  (ZOLOFT ) 100 MG tablet TAKE 1 TABLET(100 MG) BY MOUTH DAILY 04/22/24  Yes Johnny Garnette LABOR, MD  AMBULATORY NON FORMULARY MEDICATION Medication  Name: Diltiazem 2%/Lidocaine  2%   Using your index finger apply a small amount of medication inside the anal opening and to the external anal area twice daily x 6 weeks. Patient not taking: Reported on 08/04/2024 12/29/23   Craig Palma SAUNDERS, PA-C  dicyclomine  (BENTYL ) 20 MG tablet Take 1 tablet (20 mg total) by mouth 3 (three) times daily as needed for spasms. Patient not taking: Reported on 08/04/2024 12/29/23   Craig Palma SAUNDERS, PA-C  ondansetron  (ZOFRAN -ODT) 4 MG disintegrating tablet Take 1 tablet (4 mg total) by mouth every 8 (eight) hours as needed for nausea or vomiting. Patient not taking: Reported on 08/04/2024 09/04/23   Charlanne Groom, MD  sucralfate  (CARAFATE ) 1 g tablet TAKE 1 TABLET(1 GRAM) BY MOUTH FOUR TIMES DAILY AT BEDTIME WITH MEALS FOR 10 DAYS 07/26/24   Craig Palma SAUNDERS, PA-C   Social History   Socioeconomic History   Marital status: Married    Spouse name: Not on file   Number of children: 3   Years of education: Not on file   Highest education level: Associate degree: academic program  Occupational History   Occupation: insurance  Tobacco Use   Smoking status: Former    Current packs/day: 0.00    Types: Cigarettes    Quit date: 10/2023    Years since quitting: 0.7   Smokeless tobacco: Never  Vaping Use   Vaping status: Never Used  Substance and Sexual Activity   Alcohol use: Yes    Comment: socially   Drug use: Yes    Types: Marijuana, Other-see comments    Comment: Gummies CBD   Sexual activity: Not on file  Other Topics Concern   Not on file  Social History Narrative   Divorced3 children   Right handed   Social Drivers of Health   Tobacco Use: Medium Risk (05/24/2024)   Patient History    Smoking Tobacco Use: Former    Smokeless Tobacco Use: Never    Passive Exposure: Not on Actuary Strain: Low Risk (08/24/2023)   Overall Financial Resource Strain (CARDIA)    Difficulty of Paying Living Expenses: Not hard at all  Food  Insecurity: No Food Insecurity (08/24/2023)   Hunger Vital Sign    Worried About Running Out of Food in the Last Year: Never true    Ran Out of Food in the Last Year: Never true  Transportation Needs: No Transportation Needs (08/24/2023)   PRAPARE - Administrator, Civil Service (Medical): No    Lack of Transportation (Non-Medical): No  Physical Activity: Insufficiently Active (08/24/2023)   Exercise Vital Sign    Days of Exercise per Week: 2 days    Minutes of Exercise per Session: 20 min  Stress: Stress Concern Present (08/24/2023)   [employer] of Occupational Health - Occupational Stress Questionnaire    Feeling of Stress :  Rather much  Social Connections: Moderately Isolated (08/24/2023)   Social Connection and Isolation Panel    Frequency of Communication with Friends and Family: More than three times a week    Frequency of Social Gatherings with Friends and Family: Twice a week    Attends Religious Services: Never    Database Administrator or Organizations: No    Attends Engineer, Structural: Not on file    Marital Status: Married  Intimate Partner Violence: Unknown (11/28/2021)   Received from [facility]   HITS    Physically Hurt: Not on file    Insult or Talk Down To: Not on file    Threaten Physical Harm: Not on file    Scream or Curse: Not on file  Depression (PHQ2-9): Low Risk (02/08/2024)   Depression (PHQ2-9)    PHQ-2 Score: 0  Alcohol Screen: Low Risk (08/24/2023)   Alcohol Screen    Last Alcohol Screening Score (AUDIT): 2  Housing: High Risk (08/24/2023)   Housing Stability Vital Sign    Unable to Pay for Housing in the Last Year: Yes    Number of Times Moved in the Last Year: 0    Homeless in the Last Year: No  Utilities: Not on file  Health Literacy: Not on file    Review of Systems Per HPI.   Objective:   Vitals:   08/04/24 0904  BP: 120/72  Pulse: 72  Resp: 16  Temp: 98 F (36.7 C)  TempSrc: Temporal  SpO2: 98%   Weight: 142 lb (64.4 kg)  Height: 5' 5 (1.651 m)     Physical Exam Vitals reviewed.  Constitutional:      General: [name] is not in acute distress.    Appearance: [name] is well-developed.  HENT:     Head: Normocephalic and atraumatic.     Right Ear: Hearing, tympanic membrane, ear canal and external ear normal.     Left Ear: Hearing, tympanic membrane, ear canal and external ear normal.     Nose: Nose normal.     Comments: Sinuses nontender    Mouth/Throat:     Pharynx: No posterior oropharyngeal erythema.  Eyes:     Conjunctiva/sclera: Conjunctivae normal.     Pupils: Pupils are equal, round, and reactive to light.  Cardiovascular:     Rate and Rhythm: Normal rate and regular rhythm.     Heart sounds: Normal heart sounds. No murmur heard. Pulmonary:     Effort: Pulmonary effort is normal. No respiratory distress.     Breath sounds: Normal breath sounds. No wheezing or rhonchi.     Comments: Clear, normal effort, full sentences.  Skin:    General: Skin is warm and dry.     Findings: No rash.  Neurological:     Mental Status: [name] is alert and oriented to person, place, and time.  Psychiatric:        Mood and Affect: Mood normal.        Behavior: Behavior normal.      Results for orders placed or performed in visit on 08/04/24  POCT Influenza A/B   Collection Time: 08/04/24  9:23 AM  Result Value Ref Range   Influenza A, POC Negative Negative   Influenza B, POC Negative Negative  POC COVID-19 BinaxNow   Collection Time: 08/04/24  9:23 AM  Result Value Ref Range   SARS Coronavirus 2 Ag Negative Negative  POCT rapid strep A   Collection Time: 08/04/24  9:29 AM  Result  Value Ref Range   Rapid Strep A Screen Negative Negative     Assessment & Plan:  [name] is a 68 y.o. female . Sore throat - Plan: POCT rapid strep A  Fever, unspecified fever cause - Plan: POCT Influenza A/B, POC COVID-19 BinaxNow  Acute cough - Plan: guaiFENesin -codeine 100-10 MG/5ML  syrup  Upper respiratory tract infection, unspecified type - Plan: guaiFENesin -codeine 100-10 MG/5ML syrup  Suspected viral upper respiratory infection with sick contacts with similar symptoms.  No chest symptoms at this time, lungs clear.  Afebrile, reassuring exam and vital signs.  Possibly slight improvement since this morning.  Given suspected viral illness we will hold on antibiotics for now, especially with her history of UC and flares with antibiotics.  Symptomatic care discussed with Tylenol, fluids, rest.  Codeine cough syrup if needed at night, advised not to combine with benzodiazepine.  RTC/urgent care/ER precautions given if worsening.  All questions answered. Meds ordered this encounter  Medications   guaiFENesin -codeine 100-10 MG/5ML syrup    Sig: Take 5-10 mLs by mouth at bedtime as needed for cough.    Dispense:  120 mL    Refill:  0   Patient Instructions  Unfortunately I do think you probably have a viral infection, similar to family members.  The good news is that should improve within the next few days.  Lungs sound clear today and without any chest symptoms I do not think x-rays or blood work needed at this time.  In office testing for flu COVID and strep throat were all negative.  As we discussed other viruses can cause the symptoms.  Tylenol can be helpful for body aches.  Mucinex  if needed for congestion, cough and I sent in some codeine cough syrup to use at nighttime if needed.  Make sure drink plenty of fluids and rest, see other information below.  If any worsening symptoms please be seen, but again I expect you to be improving in the next few days.  Hang in there!  Upper Respiratory Infection, Adult An upper respiratory infection (URI) is a common viral infection of the nose, throat, and upper air passages that lead to the lungs. The most common type of URI is the common cold. URIs usually get better on their own, without medical treatment. What are the causes? A URI  is caused by a virus. You may catch a virus by: Breathing in droplets from an infected person's cough or sneeze. Touching something that has been exposed to the virus (is contaminated) and then touching your mouth, nose, or eyes. What increases the risk? You are more likely to get a URI if: You are very young or very old. You have close contact with others, such as at work, school, or a health care facility. You smoke. You have long-term (chronic) heart or lung disease. You have a weakened disease-fighting system (immune system). You have nasal allergies or asthma. You are experiencing a lot of stress. You have poor nutrition. What are the signs or symptoms? A URI usually involves some of the following symptoms: Runny or stuffy (congested) nose. Cough. Sneezing. Sore throat. Headache. Fatigue. Fever. Loss of appetite. Pain in your forehead, behind your eyes, and over your cheekbones (sinus pain). Muscle aches. Redness or irritation of the eyes. Pressure in the ears or face. How is this diagnosed? This condition may be diagnosed based on your medical history and symptoms, and a physical exam. Your health care provider may use a swab to take a  mucus sample from your nose (nasal swab). This sample can be tested to determine what virus is causing the illness. How is this treated? URIs usually get better on their own within 7-10 days. Medicines cannot cure URIs, but your health care provider may recommend certain medicines to help relieve symptoms, such as: Over-the-counter cold medicines. Cough suppressants. Coughing is a type of defense against infection that helps to clear the respiratory system, so take these medicines only as recommended by your health care provider. Fever-reducing medicines. Follow these instructions at home: Activity Rest as needed. If you have a fever, stay home from work or school until your fever is gone or until your health care provider says your URI  cannot spread to other people (is no longer contagious). Your health care provider may have you wear a face mask to prevent your infection from spreading. Relieving symptoms Gargle with a mixture of salt and water 3-4 times a day or as needed. To make salt water, completely dissolve -1 tsp (3-6 g) of salt in 1 cup (237 mL) of warm water. Use a cool-mist humidifier to add moisture to the air. This can help you breathe more easily. Eating and drinking  Drink enough fluid to keep your urine pale yellow. Eat soups and other clear broths. General instructions  Take over-the-counter and prescription medicines only as told by your health care provider. These include cold medicines, fever reducers, and cough suppressants. Do not use any products that contain nicotine  or tobacco. These products include cigarettes, chewing tobacco, and vaping devices, such as e-cigarettes. If you need help quitting, ask your health care provider. Stay away from secondhand smoke. Stay up to date on all immunizations, including the yearly (annual) flu vaccine. Keep all follow-up visits. This is important. How to prevent the spread of infection to others URIs can be contagious. To prevent the infection from spreading: Wash your hands with soap and water for at least 20 seconds. If soap and water are not available, use hand sanitizer. Avoid touching your mouth, face, eyes, or nose. Cough or sneeze into a tissue or your sleeve or elbow instead of into your hand or into the air.  Contact a health care provider if: You are getting worse instead of better. You have a fever or chills. Your mucus is brown or red. You have yellow or brown discharge coming from your nose. You have pain in your face, especially when you bend forward. You have swollen neck glands. You have pain while swallowing. You have white areas in the back of your throat. Get help right away if: You have shortness of breath that gets worse. You have  severe or persistent: Headache. Ear pain. Sinus pain. Chest pain. You have chronic lung disease along with any of the following: Making high-pitched whistling sounds when you breathe, most often when you breathe out (wheezing). Prolonged cough (more than 14 days). Coughing up blood. A change in your usual mucus. You have a stiff neck. You have changes in your: Vision. Hearing. Thinking. Mood. These symptoms may be an emergency. Get help right away. Call 911. Do not wait to see if the symptoms will go away. Do not drive yourself to the hospital. Summary An upper respiratory infection (URI) is a common infection of the nose, throat, and upper air passages that lead to the lungs. A URI is caused by a virus. URIs usually get better on their own within 7-10 days. Medicines cannot cure URIs, but your health care provider may recommend  certain medicines to help relieve symptoms. This information is not intended to replace advice given to you by your health care provider. Make sure you discuss any questions you have with your health care provider. Document Revised: 03/13/2021 Document Reviewed: 03/13/2021 Elsevier Patient Education  2024 Elsevier Inc.    Signed,   Reyes Pines, MD [facility], [facility] 08/04/2024 9:55 AM      [1] No Known Allergies

## 2024-08-04 NOTE — Patient Instructions (Signed)
 Unfortunately I do think you probably have a viral infection, similar to family members.  The good news is that should improve within the next few days.  Lungs sound clear today and without any chest symptoms I do not think x-rays or blood work needed at this time.  In office testing for flu COVID and strep throat were all negative.  As we discussed other viruses can cause the symptoms.  Tylenol can be helpful for body aches.  Mucinex  if needed for congestion, cough and I sent in some codeine cough syrup to use at nighttime if needed.  Make sure drink plenty of fluids and rest, see other information below.  If any worsening symptoms please be seen, but again I expect you to be improving in the next few days.  Hang in there!  Upper Respiratory Infection, Adult An upper respiratory infection (URI) is a common viral infection of the nose, throat, and upper air passages that lead to the lungs. The most common type of URI is the common cold. URIs usually get better on their own, without medical treatment. What are the causes? A URI is caused by a virus. You may catch a virus by: Breathing in droplets from an infected person's cough or sneeze. Touching something that has been exposed to the virus (is contaminated) and then touching your mouth, nose, or eyes. What increases the risk? You are more likely to get a URI if: You are very young or very old. You have close contact with others, such as at work, school, or a health care facility. You smoke. You have long-term (chronic) heart or lung disease. You have a weakened disease-fighting system (immune system). You have nasal allergies or asthma. You are experiencing a lot of stress. You have poor nutrition. What are the signs or symptoms? A URI usually involves some of the following symptoms: Runny or stuffy (congested) nose. Cough. Sneezing. Sore throat. Headache. Fatigue. Fever. Loss of appetite. Pain in your forehead, behind your eyes, and  over your cheekbones (sinus pain). Muscle aches. Redness or irritation of the eyes. Pressure in the ears or face. How is this diagnosed? This condition may be diagnosed based on your medical history and symptoms, and a physical exam. Your health care provider may use a swab to take a mucus sample from your nose (nasal swab). This sample can be tested to determine what virus is causing the illness. How is this treated? URIs usually get better on their own within 7-10 days. Medicines cannot cure URIs, but your health care provider may recommend certain medicines to help relieve symptoms, such as: Over-the-counter cold medicines. Cough suppressants. Coughing is a type of defense against infection that helps to clear the respiratory system, so take these medicines only as recommended by your health care provider. Fever-reducing medicines. Follow these instructions at home: Activity Rest as needed. If you have a fever, stay home from work or school until your fever is gone or until your health care provider says your URI cannot spread to other people (is no longer contagious). Your health care provider may have you wear a face mask to prevent your infection from spreading. Relieving symptoms Gargle with a mixture of salt and water 3-4 times a day or as needed. To make salt water, completely dissolve -1 tsp (3-6 g) of salt in 1 cup (237 mL) of warm water. Use a cool-mist humidifier to add moisture to the air. This can help you breathe more easily. Eating and drinking  Drink enough  fluid to keep your urine pale yellow. Eat soups and other clear broths. General instructions  Take over-the-counter and prescription medicines only as told by your health care provider. These include cold medicines, fever reducers, and cough suppressants. Do not use any products that contain nicotine  or tobacco. These products include cigarettes, chewing tobacco, and vaping devices, such as e-cigarettes. If you need  help quitting, ask your health care provider. Stay away from secondhand smoke. Stay up to date on all immunizations, including the yearly (annual) flu vaccine. Keep all follow-up visits. This is important. How to prevent the spread of infection to others URIs can be contagious. To prevent the infection from spreading: Wash your hands with soap and water for at least 20 seconds. If soap and water are not available, use hand sanitizer. Avoid touching your mouth, face, eyes, or nose. Cough or sneeze into a tissue or your sleeve or elbow instead of into your hand or into the air.  Contact a health care provider if: You are getting worse instead of better. You have a fever or chills. Your mucus is brown or red. You have yellow or brown discharge coming from your nose. You have pain in your face, especially when you bend forward. You have swollen neck glands. You have pain while swallowing. You have white areas in the back of your throat. Get help right away if: You have shortness of breath that gets worse. You have severe or persistent: Headache. Ear pain. Sinus pain. Chest pain. You have chronic lung disease along with any of the following: Making high-pitched whistling sounds when you breathe, most often when you breathe out (wheezing). Prolonged cough (more than 14 days). Coughing up blood. A change in your usual mucus. You have a stiff neck. You have changes in your: Vision. Hearing. Thinking. Mood. These symptoms may be an emergency. Get help right away. Call 911. Do not wait to see if the symptoms will go away. Do not drive yourself to the hospital. Summary An upper respiratory infection (URI) is a common infection of the nose, throat, and upper air passages that lead to the lungs. A URI is caused by a virus. URIs usually get better on their own within 7-10 days. Medicines cannot cure URIs, but your health care provider may recommend certain medicines to help relieve  symptoms. This information is not intended to replace advice given to you by your health care provider. Make sure you discuss any questions you have with your health care provider. Document Revised: 03/13/2021 Document Reviewed: 03/13/2021 Elsevier Patient Education  2024 [employer].

## 2024-08-08 ENCOUNTER — Ambulatory Visit: Admitting: Family Medicine

## 2024-08-08 VITALS — BP 122/74 | HR 81 | Temp 99.0°F | Ht 65.0 in | Wt 141.0 lb

## 2024-08-08 DIAGNOSIS — B001 Herpesviral vesicular dermatitis: Secondary | ICD-10-CM

## 2024-08-08 DIAGNOSIS — R070 Pain in throat: Secondary | ICD-10-CM

## 2024-08-08 MED ORDER — ACYCLOVIR 5 % EX OINT: 1.0000 | TOPICAL_OINTMENT | CUTANEOUS | 2 refills | Status: AC

## 2024-08-08 NOTE — Progress Notes (Signed)
° °  Subjective:    Patient ID: [name], female    DOB: 1956/02/07, 68 y.o.   MRN: [id number]  HPI Here for 2 issues. First she has had intermittent pains in a spot on the left side of her throat for several years. She has never mentioned this before. Sometimes she has pain with swallowing, but food never stops in her throat. Sometimes her voice gets hoarse. She is proud to tel me that she quit smoking this past March. Also she has periodic breakouts of fever blisters around her mouth. OTC agents do not help.    Review of Systems  Constitutional: Negative.   HENT:  Positive for sore throat, trouble swallowing and voice change. Negative for congestion and postnasal drip.   Eyes: Negative.   Respiratory: Negative.         Objective:   Physical Exam Constitutional:      Appearance: Normal appearance. She is well-developed.  HENT:     Mouth/Throat:     Pharynx: Oropharynx is clear.  Pulmonary:     Effort: Pulmonary effort is normal.     Breath sounds: Normal breath sounds.  Musculoskeletal:     Cervical back: No tenderness.  Lymphadenopathy:     Cervical: No cervical adenopathy.  Neurological:     Mental Status: She is alert.           Assessment & Plan:  She has throat pain, so we will refer her to ENT. Hopefully she can have a laryngoscopy performed, considering her hx of smoking. For the fever blisters, she can apply Acyclovir  ointment as needed. Garnette Olmsted, MD

## 2024-08-09 ENCOUNTER — Encounter (INDEPENDENT_AMBULATORY_CARE_PROVIDER_SITE_OTHER): Payer: Self-pay

## 2024-08-09 ENCOUNTER — Ambulatory Visit: Admitting: Family Medicine

## 2024-08-22 ENCOUNTER — Ambulatory Visit

## 2024-08-22 ENCOUNTER — Telehealth: Payer: Self-pay

## 2024-08-22 VITALS — Ht 65.0 in | Wt 139.1 lb

## 2024-08-22 DIAGNOSIS — K51019 Ulcerative (chronic) pancolitis with unspecified complications: Secondary | ICD-10-CM

## 2024-08-22 MED ORDER — NA SULFATE-K SULFATE-MG SULF 17.5-3.13-1.6 GM/177ML PO SOLN: 1.0000 | Freq: Once | ORAL | 0 refills | Status: AC

## 2024-08-22 NOTE — Progress Notes (Signed)

## 2024-08-22 NOTE — Telephone Encounter (Signed)
 Patient no show/answer PV appointment for today. I called patient second time, no answer, left a message for the patient to call us  back today before 4:30 pm to reschedule needed pre-visit or the PV and procedure will be cancelled.

## 2024-08-22 NOTE — Telephone Encounter (Signed)
Patient has been rescheduled for 3:30 today.

## 2024-08-22 NOTE — Telephone Encounter (Signed)
 RN attempted to call patient, NO answer. Voicemail left informing patient RN would call back in 10 minutes.    [facility] number left to call back and complete Pre-visit or to call and reschedule pre-visit.

## 2024-08-31 ENCOUNTER — Encounter: Admitting: Gastroenterology

## 2024-09-06 ENCOUNTER — Encounter: Payer: Self-pay | Admitting: Gastroenterology

## 2024-09-13 ENCOUNTER — Telehealth: Payer: Self-pay | Admitting: Gastroenterology

## 2024-09-13 NOTE — Telephone Encounter (Signed)
 PT is calling to update that she has ate salads and popcorn when she was to stop them for the colonoscopy. Requesting call back.

## 2024-09-13 NOTE — Telephone Encounter (Signed)
 Pt stated that she ate popcorn and had a salad on 1/19 /2026. Pt stated that she did not review her colonoscopy instructions until after.  Pt has a Colonoscopy planned for  09/15/2024 at 3:00 PM. Pt was notified to proceed with the Procedure. Please advise if other recommendations

## 2024-09-13 NOTE — Telephone Encounter (Signed)
"   should not be a problem RG "

## 2024-09-13 NOTE — Telephone Encounter (Signed)
 Pt made aware. Pt verbalized understanding with all questions answered.

## 2024-09-14 ENCOUNTER — Ambulatory Visit (INDEPENDENT_AMBULATORY_CARE_PROVIDER_SITE_OTHER)

## 2024-09-14 ENCOUNTER — Encounter (INDEPENDENT_AMBULATORY_CARE_PROVIDER_SITE_OTHER): Payer: Self-pay

## 2024-09-14 VITALS — BP 117/74 | HR 71 | Ht 65.0 in | Wt 139.0 lb

## 2024-09-14 DIAGNOSIS — M26602 Left temporomandibular joint disorder, unspecified: Secondary | ICD-10-CM

## 2024-09-14 DIAGNOSIS — K146 Glossodynia: Secondary | ICD-10-CM

## 2024-09-14 DIAGNOSIS — J392 Other diseases of pharynx: Secondary | ICD-10-CM

## 2024-09-14 NOTE — Progress Notes (Signed)
 Dear Dr. Johnny, Here is my assessment for our mutual patient, [name]. Thank you for allowing me the opportunity to care for your patient. Please do not hesitate to contact me should you have any other questions. Sincerely, Dr. Hadassah Parody  Otolaryngology Clinic Note Referring provider: Dr. 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  This is intermittent but has been going on for years.  Frequently exacerbated by hot or spicy foods.  Feels like there is a dry or raw spot back behind her tongue.  Commonly gets canker sores and states this feels somewhat like a canker sore.  Currently the pain is not present.  She has a history of ulcerative colitis and not sure if this is related.  She is also having left TMJ pain and some left ear pain, wondering if this could be related.    Independent Review of Additional Tests or Records:  Referral note 08/08/2024 Paige Cassis, MD: Intermittent pain spots on the left side of her throat for several years.  Sometimes pain with swallowing but food never stops in the throat.  Sometimes voice gets hoarse.  Quit smoking in March.  08/04/2024 rapid strep negative 06/23/2024 sed rate: 27 04/01/2024 B12 380 03/31/2024 Sjogren syndrome A and B antibodies negative 02/08/2024 TSH 0.62 02/08/2024 hemoglobin A1c 5.6  PMH/Meds/All/SocHx/FamHx/ROS:   Past Medical History:  Diagnosis Date   Anxiety    Clostridioides difficile infection    Depression    High blood cholesterol    Migraine    Palpitations    Pancreatitis    Ulcerative colitis [facility])      Past Surgical History:  Procedure Laterality Date   APPENDECTOMY Right    BIOPSY  08/18/2019   Procedure: BIOPSY;  Surgeon: Charlanne Groom, MD;  Location: [facility];  Service: Endoscopy;;    CATARACT EXTRACTION, BILATERAL Bilateral 06/08/2024   [facility]   COLONOSCOPY N/A 08/18/2019   Procedure: COLONOSCOPY;  Surgeon: Charlanne Groom, MD;  Location: [facility];  Service: Endoscopy;  Laterality: N/A;   COLONOSCOPY  12/12/2021   per Dr. Charlanne, no polyps, repeat in 5 yrs   ESOPHAGOGASTRODUODENOSCOPY N/A 08/18/2019   per Dr. Charlanne, showed GERD, neg for H pylori   POLYPECTOMY  08/18/2019   Procedure: POLYPECTOMY;  Surgeon: Charlanne Groom, MD;  Location: [facility];  Service: Endoscopy;;   TONSILLECTOMY      Family History  Problem Relation Age of Onset   Hypertension Mother    Prostate cancer Father    Liver disease Brother        alcohol related   Liver disease Brother        alcohol related   Colon cancer Neg Hx    Esophageal cancer Neg Hx    Stomach cancer Neg Hx    Pancreatic cancer Neg Hx    Rectal cancer Neg Hx      Social Connections: Socially Integrated (08/08/2024)   Social Connection and Isolation Panel    Frequency of Communication with Friends and Family: More than three times a week    Frequency of Social Gatherings with Friends and Family: More than three times a week    Attends Religious Services: 1 to 4 times per year    Active Member of [employer] or Organizations: Yes  Attends Banker Meetings: More than 4 times per year    Marital Status: Married     Current Outpatient Medications  Medication Instructions   acyclovir  ointment (ZOVIRAX ) 5 % 1 Application, Topical, Every  3 hours   atorvastatin  (LIPITOR) 40 mg, Oral, Daily   buPROPion  (WELLBUTRIN  XL) 300 MG 24 hr tablet TAKE 1 TABLET(300 MG) BY MOUTH IN THE MORNING   ezetimibe  (ZETIA ) 10 mg, Oral, Daily   guaiFENesin -codeine  100-10 MG/5ML syrup 5-10 mLs, Oral, At bedtime PRN   LORazepam  (ATIVAN ) 0.5 MG tablet TAKE 1 TABLET(0.5 MG) BY MOUTH EVERY 8 HOURS AS NEEDED FOR ANXIETY   losartan  (COZAAR ) 50 mg, Oral, Daily   mesalamine  (LIALDA ) 1.2 g EC tablet TAKE 2 TABLETS(2.4  GRAMS) BY MOUTH TWICE DAILY   Multiple Vitamin (MULTIVITAMIN) tablet 1 tablet, Daily   pantoprazole  (PROTONIX ) 40 mg, Oral, 2 times daily before meals   sertraline  (ZOLOFT ) 100 MG tablet TAKE 1 TABLET(100 MG) BY MOUTH DAILY   sucralfate  (CARAFATE ) 1 g tablet TAKE 1 TABLET(1 GRAM) BY MOUTH FOUR TIMES DAILY AT BEDTIME WITH MEALS FOR 10 DAYS     Physical Exam:   BP 117/74 (BP Location: Left Arm, Patient Position: Sitting)   Pulse 71   Ht 5' 5 (1.651 m)   Wt 139 lb (63 kg)   SpO2 96%   BMI 23.13 kg/m   Salient findings:  CN II-XII intact   Bilateral EAC clear and TM intact with well pneumatized middle ear spaces  Anterior rhinoscopy: bilateral inferior turbinates with mild hypertrophy  No lesions of oral cavity/oropharynx  No obviously palpable neck masses/lymphadenopathy/thyromegaly  No respiratory distress or stridor TFL was indicated to better evaluate the proximal airway, given the patient's history and exam findings, and is detailed below.  Seprately Identifiable Procedures:  Prior to initiating any procedures, risks/benefits/alternatives were explained to the patient and verbal consent obtained.  Procedure Note (09/14/2024) Pre-procedure diagnosis: Throat pain, pharyngeal pain Post-procedure diagnosis: Same Procedure: Transnasal Fiberoptic Laryngoscopy, CPT 31575 - Mod 25 Indication: Throat pain, pain at the base of tongue Complications: None apparent EBL: 0 mL  The procedure was undertaken to further evaluate the patient's complaint of throat pain and pain at the base of tongue, with mirror exam inadequate for appropriate examination due to gag reflex and poor patient tolerance  Procedure:  Patient was identified as correct patient. Verbal consent was obtained. The nose was sprayed with oxymetazoline and 4% lidocaine . The The flexible laryngoscope was passed through the nose to view the nasal cavity, pharynx (oropharynx, hypopharynx) and larynx.  The larynx was examined  at rest and during multiple phonatory tasks. Documentation was obtained and reviewed with patient. The scope was removed. The patient tolerated the procedure well.  Findings: The nasal cavity and nasopharynx did not reveal any masses or lesions, mucosa appeared to be without obvious lesions. The tongue base, pharyngeal walls, piriform sinuses, vallecula, epiglottis and postcricoid region are normal in appearance. The visualized portion of the subglottis and proximal trachea is widely patent. The vocal folds are mobile bilaterally. There are no lesions on the free edge of the vocal folds nor elsewhere in the larynx worrisome for malignancy.    Electronically signed by: Hadassah JAYSON Parody, MD 09/14/2024 7:33 PM   Impression & Plans:  [name] is a 70 y.o. 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 Pharyngeal pain  J39.2      Assessment and Plan  Assessment & Plan  Burning mouth syndrome Pharyngeal pain Chronic burning and discomfort at the base of the throat, aggravated by certain foods, with no visible lesions or abnormalities on oropharyngeal and laryngeal examination. Prior laboratory evaluation for nutritional deficiencies and Sjogren's syndrome was unremarkable. The clinical presentation is consistent with burning mouth syndrome.  No evidence of malignancy or concerning lesions. - Performed thorough oropharyngeal and laryngeal examination, which was unremarkable. - Reviewed prior laboratory results that if abnormal may contribute to burning mouth including B12, TSH, and Sjogren's panel, among others, all within normal limits. - Discussed the diagnosis of burning mouth syndrome as a diagnosis of exclusion. - Advised avoidance of foods and substances that aggravate symptoms. - Recommended reviewing toothpaste ingredients and avoiding those known to exacerbate aphthous ulcers. - Provided reassurance regarding absence of concerning lesions or tumors. - Should this  recur and she wants me to look at it at a time where she is experience the pain, she may call and we can schedule follow-up  TMJ pain - Discussed that her left ear pain is likely related to her TMJ as her left ear appears healthy on exam.  She has chronic issue with TMJ related pain.  Discussed following up with her dentist regarding this.   See below regarding exact medications prescribed this encounter including dosages and route: No orders of the defined types were placed in this encounter.    Thank you for allowing me the opportunity to care for your patient. Please do not hesitate to contact me should you have any other questions.  Sincerely, Hadassah Parody, MD Otolaryngologist (ENT), [facility] Phone: [phone] Fax: [phone]  MDM:  Level 4 Complexity/Problems addressed: 4-chronic problems Data complexity: 4-  independent review of referral note, multiple labs - Morbidity: -   - Prescription Drug prescribed or managed:

## 2024-09-15 ENCOUNTER — Ambulatory Visit: Admitting: Gastroenterology

## 2024-09-15 ENCOUNTER — Encounter: Payer: Self-pay | Admitting: Gastroenterology

## 2024-09-15 VITALS — BP 142/62 | HR 61 | Temp 97.7°F | Resp 9 | Ht 65.0 in | Wt 139.1 lb

## 2024-09-15 DIAGNOSIS — R140 Abdominal distension (gaseous): Secondary | ICD-10-CM

## 2024-09-15 DIAGNOSIS — K5730 Diverticulosis of large intestine without perforation or abscess without bleeding: Secondary | ICD-10-CM | POA: Diagnosis not present

## 2024-09-15 DIAGNOSIS — Z1211 Encounter for screening for malignant neoplasm of colon: Secondary | ICD-10-CM

## 2024-09-15 DIAGNOSIS — K640 First degree hemorrhoids: Secondary | ICD-10-CM

## 2024-09-15 DIAGNOSIS — K529 Noninfective gastroenteritis and colitis, unspecified: Secondary | ICD-10-CM | POA: Diagnosis not present

## 2024-09-15 DIAGNOSIS — R197 Diarrhea, unspecified: Secondary | ICD-10-CM

## 2024-09-15 DIAGNOSIS — K51019 Ulcerative (chronic) pancolitis with unspecified complications: Secondary | ICD-10-CM

## 2024-09-15 MED ORDER — SODIUM CHLORIDE 0.9 % IV SOLN: 500.0000 mL | Freq: Once | INTRAVENOUS | Status: DC

## 2024-09-15 NOTE — Progress Notes (Signed)
 "   06/23/2024 [name] [id number] 1955-09-11   Referring provider: Johnny Garnette LABOR, MD Primary GI doctor: Dr. Charlanne   ASSESSMENT AND PLAN:    Ulcerative Colitis 11/2021 normal suggestive of complete mucosal healing mild sigmoid diverticulosis recall 3 years UC flare 12/2023, increased mesalamine  to 2.4 mg BID and had 1 month prednisone  taper, resolved but returned trial of budesonide  for 2 months not helping 12/2023 negative diatherix Fecal cal protectin 3000--> 21  03/2024 KUB with large volume stool BM 4-5 x a day, chills/sweating at night no fever, gold color stools, nausea, gas Very suspicious for IBD flare, rule out infection, likely has failed 5ASA/budesonide  - will get CT AB and pelvis with contrast - Recheck fecal calprotectin and sed rate, CBC, CMET - diathereix here in the office - start on prednisone  taper - Provide FODMAP diet guidance. - Consider restaging colonoscopy pending results, but she prefers to wait until it is due - Discussed potential biologic therapy options based on insurance coverage and preference for infusions including entyvio or remicaide if she continues to have evidence of UC flare despite 4.8 gram versus  -She is up to date on vaccinations, TB screening and Hep B screening - stop marijuana use   GERD 2023 EGD - Small transient hiatal hernia. - Gastritis. Biopsied. - Normal examined duodenum. Biopsied. Negative H pylori, celiac -Stop ETOH -Increase pantoprazole  40 mg BID -Add on carfate 1 gram QID for 10 dya -Consider AB US /HIDA/repeat EGD   Gallbladder dysfunction Possible gallbladder dysfunction due to golden stools, nausea, and abdominal pain. - Consider HIDA scan if no inflammation is found.   Rectal Fissure Posterior rectal fissure likely related to ulcerative colitis or straining No fissure on exam   Diverticulosis Diverticulosis noted on CT scan in Jan. No current symptoms or evidence of diverticulitis.   Pelvic Congestion  Syndrome Enlarged pelvic blood vessels noted on CT scan. No current intervention planned unless symptoms worsen.   Patient Care Team: Johnny Garnette LABOR, MD as PCP - General (Family Medicine) Wertman, Sara E, PA-C (Neurology) Court Dorn PARAS, MD as Consulting Physician (Cardiology)   HISTORY OF PRESENT ILLNESS: 69 y.o. female presents for evaluation of UC pancolitis. I last saw the patient in the office 03/31/2024   IBD history: Diagnosed 07/2019 after perfuse rectal bleeding Has been mesalamine  since that time. Last exacerbation 07/2021 with prednisone  maintained on mesalamine  12/29/2023 patient had office visit for diarrhea and rectal bleeding nausea gas had fecal calprotectin 3000 negative Diatherix, mesalamine  increased to 2.4 mg twice daily and had prednisone  taper of a month 03/31/2024 symptoms resolved with prednisone  taper however returned 2 to 3 weeks after, given budesonide  for 2 months, fecal calprotectin 21 (6000), sed rate negative KUB stool throughout the majority of the colon no obstruction   Last colonoscopy: 11/2021 normal suggestive of complete mucosal healing mild sigmoid diverticulosis recall 3 years 07/2019 colonoscopy moderately active pancolitis normal TI  Last small bowel imaging:   09/11/2023 CT and pelvis with contrast no acute abnormality scattered left-sided diverticulosis no diverticulitis moderate formed stool in the colon prominence of early filling of bilateral gonadal veins and pelvic lateral vessels 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  Other significant medical history: GERD on pantoprazole  40 mg daily Quit smoking in March 2025, negative Sjogren's  Current History Discussed the use of AI scribe software for clinical note transcription with the patient, who gave verbal consent to proceed.   History of Present  Illness   [name] is a 69 year old female with ulcerative  colitis who presents with persistent gastrointestinal symptoms.   She has a history of ulcerative colitis and initially presented in May with diarrhea and rectal bleeding. Her mesalamine  dosage was increased to 2.4 mg twice daily, and she was given a prednisone  taper, which initially improved her symptoms. However, symptoms returned two to three weeks after completing the prednisone  taper.   In August, she was prescribed a two-month course of budesonide , which did not alleviate her symptoms. A fecal calprotectin test showed improvement from 6000 to 21. An x-ray at that time showed stool throughout the colon.   Currently, she feels 'sick all the time' with urgency and soft stools, primarily in the morning, occurring four to five times a day. She notes a golden color to her stools but no blood. She experiences night sweats with chills but no fever, and significant nausea and gas without vomiting. She describes abdominal pain as diffuse.   She has gained weight, noting her clothes are tighter, and reports a weight increase from 132 lbs in December to 143 lbs currently. No recent antibiotic use and no family history of gallbladder issues. She continues to take mesalamine  twice daily and pantoprazole  once daily.   Her social history includes occasional alcohol and marijuana use, primarily CBD, and she quit smoking cigarettes in March. She denies regular use of NSAIDs.       Recent labs: 12/29/2023 TB GOLD NEGATIVE 12/29/2023  HepBsAG NON-REACTIVE        Latest Ref Rng & Units 04/01/2024   11:23 03/31/2024   08:56 12/30/2023   10:20 12/29/2023   14:55 09/04/2023   14:53  Inflammatory Markers  Calprotectin mcg/g 21                                             Reference Range:                                       <50     Normal                                       50-120  Borderline                                       >120    Elevated . Calprotectin in Crohn's disease and ulcerative colitis can be five  to several thousand times above the reference population (50 mcg/g or less). Levels are usually 50 mcg/g or less in healthy patients and with irritable bowel syndrome. Repeat testing in 4-6 weeks is suggested for borderline values.     6,040                                             Reference Range:                                       <  50     Normal                                       50-120  Borderline                                       >120    Elevated . Calprotectin in Crohn's disease and ulcerative colitis can be five to several thousand times above the reference population (50 mcg/g or less). Levels are usually 50 mcg/g or less in healthy patients and with irritable bowel syndrome. Repeat testing in 4-6 weeks is suggested for borderline values.        Sed Rate 0 - 30 mm/hr   21    18     CRP 0.5 - 20.0 mg/dL         <8.9     08/26/7972 WBC 6.4 HGB 11.8 MCV 88.6  04/01/2024 Iron 78 Ferritin 81.0 B12 380 03/31/2024  HGB 11.8 MCV 88.6 Platelets 219.0 04/01/2024 Iron 78 Ferritin 81.0 B12 380 Recent Labs (within last 365 days)         Recent Labs    09/04/23 1453 10/27/23 1209 12/29/23 1455 02/08/24 1131 03/31/24 0856  HGB 13.2 13.1 12.1 12.7 11.8*        IBD Health Care Maintenance: Annual Flu Vaccine - yearly  Pneumococcal Vaccine if receiving immunosuppression: -  UTD TB testing if on anti-TNF, yearly - done Vitamin D  screening - on vitamin D  COVID vaccine - 2021 Shingrix  - 2021       Immunization History  Administered Date(s) Administered   Influenza,inj,Quad PF,6+ Mos 10/13/2018   Influenza-Unspecified 06/08/2019   PFIZER(Purple Top)SARS-COV-2 Vaccination 10/24/2019, 03/25/2020   PNEUMOCOCCAL CONJUGATE-20 02/06/2023   Tdap 09/01/2018   Zoster Recombinant(Shingrix ) 10/13/2018, 01/27/2020      RELEVANT GI HISTORY, LABS, IMAGING:   CBC Labs (Brief)          Component Value Date/Time    WBC 6.4 03/31/2024 0856    RBC 3.95 03/31/2024 0856    HGB 11.8  (L) 03/31/2024 0856    HGB 12.8 11/18/2021 1317    HCT 35.0 (L) 03/31/2024 0856    HCT 37.3 11/18/2021 1317    PLT 219.0 03/31/2024 0856    PLT 264 11/18/2021 1317    MCV 88.6 03/31/2024 0856    MCV 89 11/18/2021 1317    MCH 29.5 02/08/2024 1131    MCHC 33.8 03/31/2024 0856    RDW 13.0 03/31/2024 0856    RDW 12.0 11/18/2021 1317    LYMPHSABS 1.4 03/31/2024 0856    LYMPHSABS 2.0 11/18/2021 1317    MONOABS 0.6 03/31/2024 0856    EOSABS 0.1 03/31/2024 0856    EOSABS 0.0 11/18/2021 1317    BASOSABS 0.1 03/31/2024 0856    BASOSABS 0.1 11/18/2021 1317      Recent Labs (within last 365 days)         Recent Labs    09/04/23 1453 10/27/23 1209 12/29/23 1455 02/08/24 1131 03/31/24 0856  HGB 13.2 13.1 12.1 12.7 11.8*        CMP     Labs (Brief)          Component Value Date/Time    NA 138 03/31/2024 0856    NA 145 (H) 11/18/2021 1317    K  4.7 03/31/2024 0856    CL 104 03/31/2024 0856    CO2 22 03/31/2024 0856    GLUCOSE 95 03/31/2024 0856    BUN 19 03/31/2024 0856    BUN 13 11/18/2021 1317    CREATININE 0.89 03/31/2024 0856    CREATININE 0.94 02/08/2024 1131    CALCIUM  8.9 03/31/2024 0856    PROT 6.8 05/09/2024 1410    ALBUMIN 4.6 05/09/2024 1410    AST 13 05/09/2024 1410    ALT 19 05/09/2024 1410    ALKPHOS 82 05/09/2024 1410    BILITOT 0.7 05/09/2024 1410    GFRNONAA >60 10/27/2023 1209    GFRAA >60 08/17/2019 0556          Latest Ref Rng & Units 05/09/2024    2:10 PM 03/31/2024    8:56 AM 02/03/2024   11:44 AM  Hepatic Function  Total Protein 6.0 - 8.5 g/dL 6.8  6.6  6.7   Albumin 3.9 - 4.9 g/dL 4.6  4.2  4.5   AST 0 - 40 IU/L 13  16  18    ALT 0 - 32 IU/L 19  16  20    Alk Phosphatase 49 - 135 IU/L 82  64  83   Total Bilirubin 0.0 - 1.2 mg/dL 0.7  0.4  0.7   Bilirubin, Direct 0.00 - 0.40 mg/dL 9.74  0.0  9.75       Current Medications:    Current Outpatient Medications (Endocrine & Metabolic):    predniSONE  (DELTASONE ) 10 MG tablet, Take 4 tablets (40  mg total) by mouth daily with breakfast for 7 days, THEN 3 tablets (30 mg total) daily with breakfast for 7 days, THEN 2 tablets (20 mg total) daily with breakfast for 7 days, THEN 1 tablet (10 mg total) daily with breakfast for 7 days, THEN 0.5 tablets (5 mg total) daily with breakfast for 8 days.   Current Outpatient Medications (Cardiovascular):    atorvastatin  (LIPITOR) 40 MG tablet, Take 1 tablet (40 mg total) by mouth daily.   ezetimibe  (ZETIA ) 10 MG tablet, Take 1 tablet (10 mg total) by mouth daily.   losartan  (COZAAR ) 50 MG tablet, Take 1 tablet (50 mg total) by mouth daily.         Current Outpatient Medications (Other):    AMBULATORY NON FORMULARY MEDICATION, Medication Name: Diltiazem 2%/Lidocaine  2%   Using your index finger apply a small amount of medication inside the anal opening and to the external anal area twice daily x 6 weeks.   buPROPion  (WELLBUTRIN  XL) 300 MG 24 hr tablet, TAKE 1 TABLET(300 MG) BY MOUTH IN THE MORNING   dicyclomine  (BENTYL ) 20 MG tablet, Take 1 tablet (20 mg total) by mouth 3 (three) times daily as needed for spasms.   LORazepam  (ATIVAN ) 0.5 MG tablet, Take 1 tablet (0.5 mg total) by mouth every 8 (eight) hours as needed for anxiety.   mesalamine  (LIALDA ) 1.2 g EC tablet, Take 2 tablets (2.4 g total) by mouth 2 (two) times daily.   Multiple Vitamin (MULTIVITAMIN) tablet, Take 1 tablet by mouth daily.   ondansetron  (ZOFRAN -ODT) 4 MG disintegrating tablet, Take 1 tablet (4 mg total) by mouth every 8 (eight) hours as needed for nausea or vomiting.   pantoprazole  (PROTONIX ) 40 MG tablet, Take 1 tablet (40 mg total) by mouth 2 (two) times daily before a meal.   sertraline  (ZOLOFT ) 100 MG tablet, TAKE 1 TABLET(100 MG) BY MOUTH DAILY   sucralfate  (CARAFATE ) 1 g tablet, Take 1 tablet (1  g total) by mouth 4 (four) times daily -  with meals and at bedtime for 10 days.   Medical History:      Past Medical History:  Diagnosis Date   Anxiety     Clostridioides  difficile infection     Depression     High blood cholesterol     Migraine     Palpitations     Pancreatitis     Ulcerative colitis (HCC)          Allergies:  Allergies  No Known Allergies      Surgical History:  She  has a past surgical history that includes Tonsillectomy; Appendectomy (Right); Esophagogastroduodenoscopy (N/A, 08/18/2019); Colonoscopy (N/A, 08/18/2019); biopsy (08/18/2019); polypectomy (08/18/2019); and Colonoscopy (12/12/2021). Family History:  Her family history includes Hypertension in her mother; Liver disease in her brother and brother; Prostate cancer in her father.   REVIEW OF SYSTEMS  : All other systems reviewed and negative except where noted in the History of Present Illness.   PHYSICAL EXAM: BP 132/80   Pulse 66   Ht 5' 5 (1.651 m)   Wt 147 lb (66.7 kg)   BMI 24.46 kg/m  Physical Exam   MEASUREMENTS: Weight- 143. GENERAL APPEARANCE: Well nourished, in no apparent distress. HEENT: No cervical lymphadenopathy, unremarkable thyroid, sclerae anicteric, conjunctiva pink. RESPIRATORY: Respiratory effort normal, breath sounds equal bilaterally without rales, rhonchi, or wheezing. CARDIO: Regular rate and rhythm with no murmurs, rubs, or gallops, peripheral pulses intact. ABDOMEN: Soft, non-distended, decreased bowel sounds, epigastric tenderness, no rebound, no mass appreciated. RECTAL: Soft stool, no masses, positive for blood, hemorrhoids present, no abscesses. MUSCULOSKELETAL: Full range of motion, normal gait, without edema. SKIN: Dry, intact without rashes or lesions. No jaundice. NEURO: Alert, oriented, no focal deficits. PSYCH: Cooperative, normal mood and affect.           Alan JONELLE Coombs, PA-C    Attending physician's note   I have taken history, reviewed the chart and examined the patient. I performed a substantive portion of this encounter, including complete performance of at least one of the key components, in conjunction with  the APP. I agree with the Advanced Practitioner's note, impression and recommendations.   For colon today   Anselm Bring, MD [facility] [phone]  "

## 2024-09-15 NOTE — Progress Notes (Signed)
 Report given to PACU, vss

## 2024-09-15 NOTE — Patient Instructions (Signed)

## 2024-09-15 NOTE — Progress Notes (Signed)
 Pt's states no medical or surgical changes since previsit or office visit.

## 2024-09-15 NOTE — Progress Notes (Signed)
 Called to room to assist during endoscopic procedure.  Patient ID and intended procedure confirmed with present staff. Received instructions for my participation in the procedure from the performing physician.

## 2024-09-15 NOTE — Op Note (Signed)
 [facility] Patient Name: [name] Procedure Date: 09/15/2024 4:02 PM MRN: [id number] Endoscopist: Lynnie Bring , MD, [id number] Age: 69 Referring MD:  Date of Birth: 09-26-1955 Gender: Female Account #: [id number] Procedure:                Colonoscopy Indications:              High risk colon cancer surveillance: Ulcerative                            Pancolitis. Dx Jan 2009. Recent exacerbation,                            treated with prednisone  and then maintained on                            mesalamine . Medicines:                Monitored Anesthesia Care Procedure:                Pre-Anesthesia Assessment:                           - Prior to the procedure, a History and Physical                            was performed, and patient medications and                            allergies were reviewed. The patient's tolerance of                            previous anesthesia was also reviewed. The risks                            and benefits of the procedure and the sedation                            options and risks were discussed with the patient.                            All questions were answered, and informed consent                            was obtained. Prior Anticoagulants: The patient has                            taken no anticoagulant or antiplatelet agents. ASA                            Grade Assessment: II - A patient with mild systemic                            disease. After reviewing the risks and benefits,  the patient was deemed in satisfactory condition to                            undergo the procedure.                           After obtaining informed consent, the colonoscope                            was passed under direct vision. Throughout the                            procedure, the patient's blood pressure, pulse, and                            oxygen saturations were monitored continuously. The                             Olympus Scope SN: [id number] was introduced through                            the anus and advanced to the 2 cm into the ileum.                            The colonoscopy was performed without difficulty.                            The patient tolerated the procedure well. The                            quality of the bowel preparation was good. The                            terminal ileum, ileocecal valve, appendiceal                            orifice, and rectum were photographed. Scope In: 4:16:00 PM Scope Out: 4:33:46 PM Scope Withdrawal Time: 0 hours 13 minutes 26 seconds  Total Procedure Duration: 0 hours 17 minutes 46 seconds  Findings:                 A localized area (4-5 cm) of mildly erythematous                            mucosa was found in the sigmoid colon, with some                            associated diverticula. Biopsies were taken with a                            cold forceps for histology.                           Few medium-mouthed diverticula were found in the  sigmoid colon.                           The colon (entire examined portion) appeared                            normal. Biopsies were taken with a cold forceps for                            histology from right colon, left colon and                            rectum-sent in separate jars. rectal mucosa was                            normal.                           Non-bleeding internal hemorrhoids were found during                            retroflexion. The hemorrhoids were small and Grade                            I (internal hemorrhoids that do not prolapse).                           The terminal ileum appeared normal.                           Retroflexion in the right colon was performed.                           The exam was otherwise without abnormality on                            direct and retroflexion views. Complications:            No  immediate complications. Estimated Blood Loss:     Estimated blood loss: none. Impression:               - Erythematous mucosa in the sigmoid colon.                            Biopsied. ?SCAD vs residual ulcerative colitis vs                            resolving infectious colitis.                           - Diverticulosis in the sigmoid colon.                           - The entire examined colon is normal. Biopsied.                           -  Non-bleeding internal hemorrhoids.                           - The examined portion of the ileum was normal.                           - The examination was otherwise normal on direct                            and retroflexion views. Recommendation:           - Patient has a contact number available for                            emergencies. The signs and symptoms of potential                            delayed complications were discussed with the                            patient. Return to normal activities tomorrow.                            Written discharge instructions were provided to the                            patient.                           - Resume previous diet.                           - Continue present medications including mesalamine .                           - Await pathology results.                           - Repeat colonoscopy for surveillance based on                            pathology results.                           - The findings and recommendations were discussed                            with the patient's family. Lynnie Bring, MD 09/15/2024 4:41:10 PM This report has been signed electronically.

## 2024-09-16 ENCOUNTER — Telehealth: Payer: Self-pay | Admitting: *Deleted

## 2024-09-16 NOTE — Telephone Encounter (Signed)
" °  Follow up Call-     09/15/2024    3:10 PM  Call back number  Post procedure Call Back phone  # [phone]  Permission to leave phone message Yes    Left message to call back if any questions or concerns "

## 2024-09-21 LAB — SURGICAL PATHOLOGY

## 2024-09-24 ENCOUNTER — Ambulatory Visit: Payer: Self-pay | Admitting: Gastroenterology

## 2024-09-26 ENCOUNTER — Other Ambulatory Visit: Payer: Self-pay | Admitting: Physician Assistant

## 2025-06-08 ENCOUNTER — Ambulatory Visit: Admitting: Physician Assistant
